# Patient Record
Sex: Female | Born: 1985
Health system: Southern US, Community
[De-identification: ages and names within clinical notes are randomized; demographics above are authoritative.]

## PROBLEM LIST (undated history)

## (undated) DIAGNOSIS — I071 Rheumatic tricuspid insufficiency: Secondary | ICD-10-CM

## (undated) DIAGNOSIS — R7303 Prediabetes: Secondary | ICD-10-CM

## (undated) DIAGNOSIS — F4310 Post-traumatic stress disorder, unspecified: Secondary | ICD-10-CM

## (undated) DIAGNOSIS — F319 Bipolar disorder, unspecified: Secondary | ICD-10-CM

## (undated) DIAGNOSIS — Z973 Presence of spectacles and contact lenses: Secondary | ICD-10-CM

## (undated) DIAGNOSIS — K219 Gastro-esophageal reflux disease without esophagitis: Secondary | ICD-10-CM

## (undated) DIAGNOSIS — K439 Ventral hernia without obstruction or gangrene: Secondary | ICD-10-CM

## (undated) DIAGNOSIS — M7989 Other specified soft tissue disorders: Secondary | ICD-10-CM

## (undated) DIAGNOSIS — F1910 Other psychoactive substance abuse, uncomplicated: Secondary | ICD-10-CM

## (undated) DIAGNOSIS — B1920 Unspecified viral hepatitis C without hepatic coma: Secondary | ICD-10-CM

## (undated) DIAGNOSIS — R0600 Dyspnea, unspecified: Secondary | ICD-10-CM

## (undated) DIAGNOSIS — F419 Anxiety disorder, unspecified: Secondary | ICD-10-CM

## (undated) DIAGNOSIS — K8020 Calculus of gallbladder without cholecystitis without obstruction: Secondary | ICD-10-CM

## (undated) DIAGNOSIS — R12 Heartburn: Secondary | ICD-10-CM

## (undated) DIAGNOSIS — Z9889 Other specified postprocedural states: Secondary | ICD-10-CM

## (undated) DIAGNOSIS — R0602 Shortness of breath: Secondary | ICD-10-CM

## (undated) DIAGNOSIS — I38 Endocarditis, valve unspecified: Secondary | ICD-10-CM

## (undated) DIAGNOSIS — K045 Chronic apical periodontitis: Secondary | ICD-10-CM

## (undated) DIAGNOSIS — T8149XA Infection following a procedure, other surgical site, initial encounter: Secondary | ICD-10-CM

## (undated) DIAGNOSIS — F32A Depression, unspecified: Secondary | ICD-10-CM

## (undated) HISTORY — DX: Depression, unspecified: F32.A

## (undated) HISTORY — DX: Endocarditis, valve unspecified: I38

## (undated) HISTORY — PX: CARDIAC SURGERY: SHX584

## (undated) HISTORY — DX: Other specified soft tissue disorders: M79.89

## (undated) HISTORY — DX: Calculus of gallbladder without cholecystitis without obstruction: K80.20

## (undated) HISTORY — DX: Shortness of breath: R06.02

## (undated) HISTORY — DX: Ventral hernia without obstruction or gangrene: K43.9

## (undated) HISTORY — DX: Rheumatic tricuspid insufficiency: I07.1

## (undated) HISTORY — PX: APPENDECTOMY: SHX54

---

## 2016-11-06 ENCOUNTER — Emergency Department (HOSPITAL_BASED_OUTPATIENT_CLINIC_OR_DEPARTMENT_OTHER)
Admission: EM | Admit: 2016-11-06 | Discharge: 2016-11-06 | Disposition: A | Discharge status: Home / Self Care | Payer: Self-pay | Attending: Emergency Medicine | Admitting: Emergency Medicine

## 2016-11-06 ENCOUNTER — Encounter (HOSPITAL_BASED_OUTPATIENT_CLINIC_OR_DEPARTMENT_OTHER): Payer: Self-pay | Admitting: *Deleted

## 2016-11-06 DIAGNOSIS — X509XXA Other and unspecified overexertion or strenuous movements or postures, initial encounter: Secondary | ICD-10-CM | POA: Insufficient documentation

## 2016-11-06 DIAGNOSIS — Y939 Activity, unspecified: Secondary | ICD-10-CM | POA: Insufficient documentation

## 2016-11-06 DIAGNOSIS — F17210 Nicotine dependence, cigarettes, uncomplicated: Secondary | ICD-10-CM | POA: Insufficient documentation

## 2016-11-06 DIAGNOSIS — S39012A Strain of muscle, fascia and tendon of lower back, initial encounter: Secondary | ICD-10-CM | POA: Insufficient documentation

## 2016-11-06 DIAGNOSIS — Y929 Unspecified place or not applicable: Secondary | ICD-10-CM | POA: Insufficient documentation

## 2016-11-06 DIAGNOSIS — Y999 Unspecified external cause status: Secondary | ICD-10-CM | POA: Insufficient documentation

## 2016-11-06 HISTORY — DX: Unspecified viral hepatitis C without hepatic coma: B19.20

## 2016-11-06 MED ORDER — NAPROXEN 500 MG PO TABS: 500.0000 mg | ORAL_TABLET | Freq: Two times a day (BID) | ORAL | 0 refills | Status: DC

## 2016-11-06 MED ORDER — CYCLOBENZAPRINE HCL 10 MG PO TABS: 5.0000 mg | ORAL_TABLET | Freq: Two times a day (BID) | ORAL | 0 refills | Status: DC | PRN

## 2016-11-06 MED ORDER — KETOROLAC TROMETHAMINE 60 MG/2ML IM SOLN
60.0000 mg | Freq: Once | INTRAMUSCULAR | Status: AC
  Administered 2016-11-06: 60 mg via INTRAMUSCULAR
  Filled 2016-11-06: qty 2

## 2016-11-06 MED ORDER — METHYLPREDNISOLONE 4 MG PO TBPK: ORAL_TABLET | ORAL | 0 refills | Status: DC

## 2016-11-06 MED FILL — METHYLPREDNISOLONE 4 MG TAB: 4 | 6 days supply | Qty: 21 | Fill #0

## 2016-11-06 MED FILL — NAPROXEN 500 MG TABLET: 500 | 7 days supply | Qty: 14 | Fill #0

## 2016-11-06 MED FILL — CYCLOBENZAPRINE 10 MG TAB: 10 | 5 days supply | Qty: 10 | Fill #0

## 2016-11-06 NOTE — ED Provider Notes (Signed)
[facility] Provider Note   CSN: [id number] Arrival date & time: 11/06/16  7829     History   Chief Complaint Chief Complaint  Patient presents with  . Back Pain    HPI Heather Day is a 31 y.o. female.  The history is provided by the patient. No language interpreter was used.  Back Pain     Heather Day is a 31 y.o. female who presents to the Emergency Department complaining of back pain.  She reports 3-4 days of right lower back pain. Pain is constant in nature and sharp and stabbing when she moves. The movements that bother her are bending as well as lifting her right leg. Pain is nonradiating in nature. She denies any fevers, night sweats, numbness, weakness, abdominal pain, change in urination. Does a lot of heavy lifting for work but does not recall any definite injury. 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 That was 2 years ago and she completed treatment. She did have a short relapse and IV drug use 2 months ago but none since then. She denies any skin lesions. She has tried ibuprofen at home with no significant change in her pain. Past Medical History:  Diagnosis Date  . Hepatitis C     There are no active problems to display for this patient.   Past Surgical History:  Procedure Laterality Date  . CESAREAN SECTION      OB History    No data available       Home Medications    Prior to Admission medications   Medication Sig Start Date End Date Taking? Authorizing Provider  cyclobenzaprine (FLEXERIL) 10 MG tablet Take 0.5-1 tablets (5-10 mg total) by mouth 2 (two) times daily as needed for muscle spasms. 11/06/16   Tilden Fossa, MD  methylPREDNISolone (MEDROL DOSEPAK) 4 MG TBPK tablet Take according to label instructions 11/06/16   Tilden Fossa, MD  naproxen (NAPROSYN) 500 MG tablet Take 1 tablet (500 mg total) by mouth 2 (two) times daily. 11/06/16   Tilden Fossa, MD    Family History No family  history on file.  Social History Social History  Substance Use Topics  . Smoking status: Current Every Day Smoker    Packs/day: 1.00    Years: 20.00    Types: Cigarettes  . Smokeless tobacco: Not on file  . Alcohol use No     Allergies   Patient has no known allergies.   Review of Systems Review of Systems  Musculoskeletal: Positive for back pain.  All other systems reviewed and are negative.    Physical Exam Updated Vital Signs BP 114/87 (BP Location: Right Arm)   Pulse 78   Temp 97.7 F (36.5 C) (Oral)   Ht 5\' 8"  (1.727 m)   Wt 104.3 kg (230 lb)   LMP 11/05/2016 (Exact Date)   SpO2 98%   BMI 34.97 kg/m   Physical Exam  Constitutional: She is oriented to person, place, and time. She appears well-developed and well-nourished.  HENT:  Head: Normocephalic and atraumatic.  Cardiovascular: Normal rate and regular rhythm.   No murmur heard. Pulmonary/Chest: Effort normal and breath sounds normal. No respiratory distress.  Abdominal: Soft. There is no tenderness. There is no rebound and no guarding.  Musculoskeletal: She exhibits no edema.  2+ DP pulses bilaterally. There is mild tenderness to palpation over the right lateral lower back. There is referrable pain to that area when she raises her  bilateral legs, right greater than left as well as bending.  Neurological: She is alert and oriented to person, place, and time.  Sensation to light touch intact in bilateral lower extremities with 5 out of 5 strength in bilateral lower extremities.  Skin: Skin is warm and dry.     Psychiatric: She has a normal mood and affect. Her behavior is normal.  Nursing note and vitals reviewed.    ED Treatments / Results  Labs (all labs ordered are listed, but only abnormal results are displayed) Labs Reviewed - No data to display  EKG  EKG Interpretation None       Radiology No results found.  Procedures Procedures (including critical care time)  Medications  Ordered in ED Medications  ketorolac (TORADOL) injection 60 mg (not administered)     Initial Impression / Assessment and Plan / ED Course  I have reviewed the triage vital signs and the nursing notes.  Pertinent labs & imaging results that were available during my care of the patient were reviewed by me and considered in my medical decision making (see chart for details).     Patient with history of IV drug abuse, none currently here for evaluation of right lower back pain. She is neurovascularly intact on examination with no systemic symptoms. Current clinical picture is not consistent with renal colic, pyelonephritis, infection related to IV drug abuse. Discussed with patient home care for lumbar strain. Discussed rest with activity as tolerated, patient of heat and massage. Providing muscle relaxant, naproxen, steroids. Close return precautions as well as outpatient follow-up discussed.  Final Clinical Impressions(s) / ED Diagnoses   Final diagnoses:  Strain of lumbar region, initial encounter    New Prescriptions New Prescriptions   CYCLOBENZAPRINE (FLEXERIL) 10 MG TABLET    Take 0.5-1 tablets (5-10 mg total) by mouth 2 (two) times daily as needed for muscle spasms.   METHYLPREDNISOLONE (MEDROL DOSEPAK) 4 MG TBPK TABLET    Take according to label instructions   NAPROXEN (NAPROSYN) 500 MG TABLET    Take 1 tablet (500 mg total) by mouth 2 (two) times daily.     Tilden Fossa, MD 11/06/16 [phone]

## 2016-11-06 NOTE — ED Notes (Signed)
DC instructions reviewed with pt and husband, also reviewed Rx as written by EDP, Discussed safety while taking PO muscle relaxants. Also importance of making a follow up appt as recommened by EDP. Opportunity for questions provided.

## 2016-11-06 NOTE — ED Notes (Signed)
ED Provider at bedside. 

## 2016-11-06 NOTE — ED Triage Notes (Signed)
Pt states began having lower back pain approx 4 days ago, has sharp stabbing pains intermittently. Does a lot of lifting at work. Denies any injuries, numbness or tingling at lower extremities

## 2017-01-01 ENCOUNTER — Emergency Department (HOSPITAL_BASED_OUTPATIENT_CLINIC_OR_DEPARTMENT_OTHER)
Admission: EM | Admit: 2017-01-01 | Discharge: 2017-01-01 | Disposition: A | Discharge status: Home / Self Care | Payer: Self-pay | Attending: Emergency Medicine | Admitting: Emergency Medicine

## 2017-01-01 ENCOUNTER — Encounter (HOSPITAL_BASED_OUTPATIENT_CLINIC_OR_DEPARTMENT_OTHER): Payer: Self-pay | Admitting: *Deleted

## 2017-01-01 ENCOUNTER — Emergency Department (HOSPITAL_BASED_OUTPATIENT_CLINIC_OR_DEPARTMENT_OTHER): Payer: Self-pay

## 2017-01-01 DIAGNOSIS — Z791 Long term (current) use of non-steroidal anti-inflammatories (NSAID): Secondary | ICD-10-CM | POA: Insufficient documentation

## 2017-01-01 DIAGNOSIS — F17210 Nicotine dependence, cigarettes, uncomplicated: Secondary | ICD-10-CM | POA: Insufficient documentation

## 2017-01-01 DIAGNOSIS — G43109 Migraine with aura, not intractable, without status migrainosus: Secondary | ICD-10-CM | POA: Insufficient documentation

## 2017-01-01 LAB — CBC WITH DIFFERENTIAL/PLATELET
BASOS PCT: 0 %
Basophils Absolute: 0 10*3/uL (ref 0.0–0.1)
EOS PCT: 1 %
Eosinophils Absolute: 0.1 10*3/uL (ref 0.0–0.7)
HEMATOCRIT: 38.4 % (ref 36.0–46.0)
Hemoglobin: 12.8 g/dL (ref 12.0–15.0)
Lymphocytes Relative: 28 %
Lymphs Abs: 2.2 10*3/uL (ref 0.7–4.0)
MCH: 29.6 pg (ref 26.0–34.0)
MCHC: 33.3 g/dL (ref 30.0–36.0)
MCV: 88.7 fL (ref 78.0–100.0)
MONO ABS: 0.4 10*3/uL (ref 0.1–1.0)
MONOS PCT: 6 %
NEUTROS ABS: 4.9 10*3/uL (ref 1.7–7.7)
Neutrophils Relative %: 65 %
PLATELETS: 220 10*3/uL (ref 150–400)
RBC: 4.33 MIL/uL (ref 3.87–5.11)
RDW: 12.8 % (ref 11.5–15.5)
WBC: 7.6 10*3/uL (ref 4.0–10.5)

## 2017-01-01 LAB — URINALYSIS, MICROSCOPIC (REFLEX)

## 2017-01-01 LAB — COMPREHENSIVE METABOLIC PANEL
ALBUMIN: 4 g/dL (ref 3.5–5.0)
ALT: 15 U/L (ref 14–54)
ANION GAP: 8 (ref 5–15)
AST: 22 U/L (ref 15–41)
Alkaline Phosphatase: 48 U/L (ref 38–126)
BILIRUBIN TOTAL: 0.4 mg/dL (ref 0.3–1.2)
BUN: 18 mg/dL (ref 6–20)
CHLORIDE: 103 mmol/L (ref 101–111)
CO2: 24 mmol/L (ref 22–32)
Calcium: 8.9 mg/dL (ref 8.9–10.3)
Creatinine, Ser: 0.72 mg/dL (ref 0.44–1.00)
GFR calc Af Amer: >60 mL/min (ref >60)
Glucose, Bld: 120 mg/dL — ABNORMAL HIGH (ref 65–99)
POTASSIUM: 3.8 mmol/L (ref 3.5–5.1)
Sodium: 135 mmol/L (ref 135–145)
TOTAL PROTEIN: 6.9 g/dL (ref 6.5–8.1)

## 2017-01-01 LAB — URINALYSIS, ROUTINE W REFLEX MICROSCOPIC
Bilirubin Urine: NEGATIVE
GLUCOSE, UA: NEGATIVE mg/dL
HGB URINE DIPSTICK: NEGATIVE
KETONES UR: NEGATIVE mg/dL
Nitrite: NEGATIVE
PH: 7 (ref 5.0–8.0)
PROTEIN: NEGATIVE mg/dL
Specific Gravity, Urine: 1.02 (ref 1.005–1.030)

## 2017-01-01 LAB — PREGNANCY, URINE: PREG TEST UR: NEGATIVE

## 2017-01-01 MED ORDER — PROCHLORPERAZINE EDISYLATE 5 MG/ML IJ SOLN
10.0000 mg | Freq: Once | INTRAMUSCULAR | Status: AC
  Administered 2017-01-01: 10 mg via INTRAVENOUS
  Filled 2017-01-01: qty 2

## 2017-01-01 MED ORDER — DIPHENHYDRAMINE HCL 50 MG/ML IJ SOLN
25.0000 mg | Freq: Once | INTRAMUSCULAR | Status: AC
  Administered 2017-01-01: 25 mg via INTRAVENOUS
  Filled 2017-01-01: qty 1

## 2017-01-01 NOTE — Discharge Instructions (Signed)
Consider taking an over-the-counter medication such as Excedrin Migraine if you have any recurrent symptoms, follow-up with a primary care doctor , return as needed for worsening symptoms

## 2017-01-01 NOTE — ED Provider Notes (Signed)
[facility] EMERGENCY DEPARTMENT Provider Note   CSN: [id number] Arrival date & time: 01/01/17  1712     History   Chief Complaint Chief Complaint  Patient presents with  . Headache    HPI Heather Day is a 31 y.o. female.  HPI Emergency room for evaluation of a headache.  The patient states she was out shopping when she started having abnormal visual sensation.  She was noticing spots in front of her eyes.  She started to develop a mild headache.  She tried taking some ibuprofen and went to sleep however the headache persisted and was twice as bad as it was before.  Patient was concerned so she decided to come to the emergency room.  She denies any history of migraine headaches.  No fevers or chills.  No vomiting or diarrhea.  No focal numbness or weakness. Past Medical History:  Diagnosis Date  . Hepatitis C     There are no active problems to display for this patient.   Past Surgical History:  Procedure Laterality Date  . CESAREAN SECTION      OB History    No data available       Home Medications    Prior to Admission medications   Medication Sig Start Date End Date Taking? Authorizing Provider  cyclobenzaprine (FLEXERIL) 10 MG tablet Take 0.5-1 tablets (5-10 mg total) by mouth 2 (two) times daily as needed for muscle spasms. 11/06/16   Tilden Fossa, MD  methylPREDNISolone (MEDROL DOSEPAK) 4 MG TBPK tablet Take according to label instructions 11/06/16   Tilden Fossa, MD  naproxen (NAPROSYN) 500 MG tablet Take 1 tablet (500 mg total) by mouth 2 (two) times daily. 11/06/16   Tilden Fossa, MD    Family History No family history on file.  Social History Social History  Substance Use Topics  . Smoking status: Current Every Day Smoker    Packs/day: 1.00    Years: 20.00    Types: Cigarettes  . Smokeless tobacco: Never Used  . Alcohol use No     Allergies   Patient has no known allergies.   Review of Systems Review of Systems  All  other systems reviewed and are negative.    Physical Exam Updated Vital Signs BP 106/61 (BP Location: Right Arm)   Pulse 73   Temp 98.4 F (36.9 C) (Oral)   Resp 18   Ht 1.727 m (5\' 8" )   Wt 104.3 kg (230 lb)   LMP 12/18/2016   SpO2 100%   BMI 34.97 kg/m   Physical Exam  Constitutional: She appears well-developed and well-nourished. No distress.  HENT:  Head: Normocephalic and atraumatic.  Right Ear: External ear normal.  Left Ear: External ear normal.  Eyes: Conjunctivae are normal. Right eye exhibits no discharge. Left eye exhibits no discharge. No scleral icterus.  Neck: Neck supple. No tracheal deviation present.  Cardiovascular: Normal rate, regular rhythm and intact distal pulses.   Pulmonary/Chest: Effort normal and breath sounds normal. No stridor. No respiratory distress. She has no wheezes. She has no rales.  Abdominal: Soft. Bowel sounds are normal. She exhibits no distension. There is no tenderness. There is no rebound and no guarding.  Musculoskeletal: She exhibits no edema or tenderness.  Neurological: She is alert. She has normal strength. No cranial nerve deficit (no facial droop, extraocular movements intact, no slurred speech) or sensory deficit. She exhibits normal muscle tone. She displays no seizure activity. Coordination normal.  Skin: Skin is warm and dry.  No rash noted.  Psychiatric: She has a normal mood and affect.  Nursing note and vitals reviewed.    ED Treatments / Results  Labs (all labs ordered are listed, but only abnormal results are displayed) Labs Reviewed  URINALYSIS, ROUTINE W REFLEX MICROSCOPIC - Abnormal; Notable for the following:       Result Value   Leukocytes, UA TRACE (*)    All other components within normal limits  COMPREHENSIVE METABOLIC PANEL - Abnormal; Notable for the following:    Glucose, Bld 120 (*)    All other components within normal limits  URINALYSIS, MICROSCOPIC (REFLEX) - Abnormal; Notable for the following:      Bacteria, UA RARE (*)    Squamous Epithelial / LPF 6-30 (*)    All other components within normal limits  PREGNANCY, URINE  CBC WITH DIFFERENTIAL/PLATELET    EKG  EKG Interpretation None       Radiology Ct Head Wo Contrast  Result Date: 01/01/2017 CLINICAL DATA:  Worst headache of life, right sided head pain, no previous migraine history. EXAM: CT HEAD WITHOUT CONTRAST TECHNIQUE: Contiguous axial images were obtained from the base of the skull through the vertex without intravenous contrast. COMPARISON:  None. FINDINGS: Brain: Ventricles are normal in size and configuration. All areas of the brain demonstrate normal gray-white matter delineation. There is no mass, hemorrhage, edema or other evidence of acute parenchymal abnormality. No extra-axial hemorrhage. Vascular: No hyperdense vessel or unexpected calcification. Skull: Normal. Negative for fracture or focal lesion. Sinuses/Orbits: No acute finding. Other: None. IMPRESSION: Normal head CT. Electronically Signed   By: Bary RichardStan  Maynard M.D.   On: 01/01/2017 20:10    Procedures Procedures (including critical care time)  Medications Ordered in ED Medications  prochlorperazine (COMPAZINE) injection 10 mg (10 mg Intravenous Given 01/01/17 2007)  diphenhydrAMINE (BENADRYL) injection 25 mg (25 mg Intravenous Given 01/01/17 2008)     Initial Impression / Assessment and Plan / ED Course  I have reviewed the triage vital signs and the nursing notes.  Pertinent labs & imaging results that were available during my care of the patient were reviewed by me and considered in my medical decision making (see chart for details).   Patient presented to the emergency room with complaints of a headache preceded by visual aura.  Patient denied any prior history of headaches and she stated this was the worst when she never had.  CT scan was performed for this reason.  CT scan was within 6 hours of the onset.  No signs of any bleeding.  I doubt a  subarachnoid hemorrhage.  I suspect her symptoms are related to a migraine headache.  She improved with treatment in the emergency room.  Patient appears stable for discharge.  Final Clinical Impressions(s) / ED Diagnoses   Final diagnoses:  Migraine with aura and without status migrainosus, not intractable    New Prescriptions New Prescriptions   No medications on file     Linwood DibblesKnapp, Zenobia Kuennen, MD 01/01/17 2053

## 2017-01-01 NOTE — ED Notes (Signed)
Pt states she is 119 days clean from IV drug use

## 2017-01-01 NOTE — ED Triage Notes (Signed)
Headache x 3 hours. She was seeing spots prior to pain.

## 2017-02-09 ENCOUNTER — Encounter (HOSPITAL_BASED_OUTPATIENT_CLINIC_OR_DEPARTMENT_OTHER): Payer: Self-pay

## 2017-02-09 ENCOUNTER — Other Ambulatory Visit: Payer: Self-pay

## 2017-02-09 ENCOUNTER — Emergency Department (HOSPITAL_BASED_OUTPATIENT_CLINIC_OR_DEPARTMENT_OTHER)
Admission: EM | Admit: 2017-02-09 | Discharge: 2017-02-09 | Disposition: A | Discharge status: Home / Self Care | Payer: Self-pay | Attending: Emergency Medicine | Admitting: Emergency Medicine

## 2017-02-09 ENCOUNTER — Emergency Department (HOSPITAL_BASED_OUTPATIENT_CLINIC_OR_DEPARTMENT_OTHER): Payer: Self-pay

## 2017-02-09 DIAGNOSIS — R0602 Shortness of breath: Secondary | ICD-10-CM | POA: Insufficient documentation

## 2017-02-09 DIAGNOSIS — Z79899 Other long term (current) drug therapy: Secondary | ICD-10-CM | POA: Insufficient documentation

## 2017-02-09 DIAGNOSIS — R0789 Other chest pain: Secondary | ICD-10-CM | POA: Insufficient documentation

## 2017-02-09 DIAGNOSIS — F17210 Nicotine dependence, cigarettes, uncomplicated: Secondary | ICD-10-CM | POA: Insufficient documentation

## 2017-02-09 LAB — URINALYSIS, ROUTINE W REFLEX MICROSCOPIC
BILIRUBIN URINE: NEGATIVE
GLUCOSE, UA: NEGATIVE mg/dL
HGB URINE DIPSTICK: NEGATIVE
KETONES UR: NEGATIVE mg/dL
LEUKOCYTES UA: NEGATIVE
Nitrite: NEGATIVE
PH: 6.5 (ref 5.0–8.0)
PROTEIN: NEGATIVE mg/dL
Specific Gravity, Urine: 1.025 (ref 1.005–1.030)

## 2017-02-09 LAB — CBC
HEMATOCRIT: 39.2 % (ref 36.0–46.0)
Hemoglobin: 13.1 g/dL (ref 12.0–15.0)
MCH: 29.9 pg (ref 26.0–34.0)
MCHC: 33.4 g/dL (ref 30.0–36.0)
MCV: 89.5 fL (ref 78.0–100.0)
PLATELETS: 249 10*3/uL (ref 150–400)
RBC: 4.38 MIL/uL (ref 3.87–5.11)
RDW: 12.5 % (ref 11.5–15.5)
WBC: 9.1 10*3/uL (ref 4.0–10.5)

## 2017-02-09 LAB — BASIC METABOLIC PANEL
Anion gap: 8 (ref 5–15)
BUN: 23 mg/dL — AB (ref 6–20)
CHLORIDE: 105 mmol/L (ref 101–111)
CO2: 25 mmol/L (ref 22–32)
CREATININE: 0.71 mg/dL (ref 0.44–1.00)
Calcium: 9.5 mg/dL (ref 8.9–10.3)
GFR calc Af Amer: >60 mL/min (ref >60)
GFR calc non Af Amer: >60 mL/min (ref >60)
GLUCOSE: 84 mg/dL (ref 65–99)
POTASSIUM: 3.6 mmol/L (ref 3.5–5.1)
SODIUM: 138 mmol/L (ref 135–145)

## 2017-02-09 LAB — HEPATIC FUNCTION PANEL
ALK PHOS: 51 U/L (ref 38–126)
ALT: 13 U/L — AB (ref 14–54)
AST: 19 U/L (ref 15–41)
Albumin: 4 g/dL (ref 3.5–5.0)
BILIRUBIN DIRECT: 0.1 mg/dL (ref 0.1–0.5)
BILIRUBIN INDIRECT: 0.3 mg/dL (ref 0.3–0.9)
BILIRUBIN TOTAL: 0.4 mg/dL (ref 0.3–1.2)
Total Protein: 7.1 g/dL (ref 6.5–8.1)

## 2017-02-09 LAB — D-DIMER, QUANTITATIVE (NOT AT ARMC): D DIMER QUANT: 0.46 ug{FEU}/mL (ref 0.00–0.50)

## 2017-02-09 LAB — PREGNANCY, URINE: Preg Test, Ur: NEGATIVE

## 2017-02-09 LAB — RAPID URINE DRUG SCREEN, HOSP PERFORMED
Amphetamines: NOT DETECTED
BARBITURATES: NOT DETECTED
BENZODIAZEPINES: NOT DETECTED
Cocaine: NOT DETECTED
Opiates: NOT DETECTED
Tetrahydrocannabinol: NOT DETECTED

## 2017-02-09 LAB — TROPONIN I: Troponin I: <0.03 ng/mL (ref <0.03)

## 2017-02-09 MED ORDER — KETOROLAC TROMETHAMINE 30 MG/ML IJ SOLN
30.0000 mg | Freq: Once | INTRAMUSCULAR | Status: AC
  Administered 2017-02-09: 30 mg via INTRAVENOUS
  Filled 2017-02-09: qty 1

## 2017-02-09 MED ORDER — GI COCKTAIL ~~LOC~~
30.0000 mL | Freq: Once | ORAL | Status: AC
  Administered 2017-02-09: 30 mL via ORAL
  Filled 2017-02-09: qty 30

## 2017-02-09 MED ORDER — PANTOPRAZOLE SODIUM 40 MG IV SOLR
40.0000 mg | Freq: Once | INTRAVENOUS | Status: AC
  Administered 2017-02-09: 40 mg via INTRAVENOUS
  Filled 2017-02-09: qty 40

## 2017-02-09 NOTE — ED Notes (Signed)
C/o chest pain radiating to left back w weakness, sob x 4 weeks  Getting worse

## 2017-02-09 NOTE — ED Triage Notes (Signed)
Pt c/o CP x2 weeks with progressive worsening. Associated ShOB, nausea, diaphoresis.

## 2017-02-09 NOTE — Discharge Instructions (Addendum)
You may alternate Tylenol 1000 mg every 6 hours as needed for pain and Ibuprofen 800 mg every 8 hours as needed for pain.  Please take Ibuprofen with food.  Your labs including blood counts, electrolytes, kidney function, liver tests, cardiac labs and a test called a d-dimer which rules out blood clots were normal today.  Your urine showed no sign of infection or dehydration.  Your pregnancy test was negative.  Your EKG is normal.  Your chest x-ray was clear.  I recommend close follow-up with a primary care physician and outpatient cardiologist.  To find a primary care or specialty doctor please call [phone] or [phone] to access "[place] Find a Doctor Service."  You may also go on the [facility] website at [email or web address]  There are also multiple [facility] practices throughout the [place] that are frequently accepting new patients. You may find a clinic that is close to your home and contact them.  [facility] -  [address] [place] [phone]   [facility] -  [address] [place] [id number] [phone]   [facility] [phone]  [place] [phone]

## 2017-02-09 NOTE — ED Provider Notes (Signed)
TIME SEEN: 1:35 AM  CHIEF COMPLAINT: Chest pain and shortness of breath  HPI: Patient is a 31 year old female with previous history of IV drug abuse, hepatitis C, endocarditis who presents to the emergency department with complaints of chest pain and shortness of breath.  States she has chest pain from her anterior chest into her back that feels like a "vice".  She feels short of breath, lightheaded, weak and fatigued over the past several weeks but progressively worsening.  States tonight she went to bed around 9:30 PM because she felt so tired.  She denies any fevers.  Reports she has had sweats but no chills.  Has had nonproductive cough.  No vomiting but has had diarrhea.  Denies dysuria, hematuria, vaginal bleeding or discharge.  Last menstrual period was 2 weeks ago.  States she has not used any IV drugs in the past 6 months.  She recently moved here 6 months ago from [place].  She does not have a primary care physician or cardiologist.  States that she has not followed up as an outpatient because she does not have insurance.  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  She left AGAINST MEDICAL ADVICE before this surgery could be performed.  No history of CAD.  No history of PE or DVT.  States she tried Tums prior to arrival without any relief.  ROS: See HPI Constitutional: no fever  Eyes: no drainage  ENT: no runny nose   Cardiovascular:  no chest pain  Resp: no SOB  GI: no vomiting GU: no dysuria Integumentary: no rash  Allergy: no hives  Musculoskeletal: no leg swelling  Neurological: no slurred speech ROS otherwise negative  PAST MEDICAL HISTORY/PAST SURGICAL HISTORY:  Past Medical History:  Diagnosis Date  . Hepatitis C     MEDICATIONS:  Prior to Admission medications   Medication Sig Start Date End Date Taking? Authorizing Provider  cyclobenzaprine  (FLEXERIL) 10 MG tablet Take 0.5-1 tablets (5-10 mg total) by mouth 2 (two) times daily as needed for muscle spasms. 11/06/16   Tilden Fossa, MD  methylPREDNISolone (MEDROL DOSEPAK) 4 MG TBPK tablet Take according to label instructions 11/06/16   Tilden Fossa, MD  naproxen (NAPROSYN) 500 MG tablet Take 1 tablet (500 mg total) by mouth 2 (two) times daily. 11/06/16   Tilden Fossa, MD    ALLERGIES:  No Known Allergies  SOCIAL HISTORY:  Social History   Tobacco Use  . Smoking status: Current Every Day Smoker    Packs/day: 1.00    Years: 20.00    Pack years: 20.00    Types: Cigarettes  . Smokeless tobacco: Never Used  Substance Use Topics  . Alcohol use: No    FAMILY HISTORY: No family history on file.  EXAM: BP 121/87 (BP Location: Right Arm)   Pulse 79   Temp 97.6 F (36.4 C) (Oral)   Resp 20   Ht 5\' 8"  (1.727 m)   Wt 108.9 kg (240 lb)   SpO2 98%   BMI 36.49 kg/m  CONSTITUTIONAL: Alert and oriented and responds appropriately to questions. Well-appearing; well-nourished, obese HEAD: Normocephalic EYES: Conjunctivae clear, pupils appear equal, EOMI ENT: normal nose; moist mucous membranes NECK: Supple, no meningismus, no nuchal rigidity, no LAD  CARD: RRR; S1 and S2 appreciated; no murmurs, no clicks, no rubs, no gallops CHEST:  Chest wall is nontender to  palpation.  No crepitus, ecchymosis, erythema, warmth, rash or other lesions present.   RESP: Normal chest excursion without splinting or tachypnea; breath sounds clear and equal bilaterally; no wheezes, no rhonchi, no rales, no hypoxia or respiratory distress, speaking full sentences ABD/GI: Normal bowel sounds; non-distended; soft, non-tender, no rebound, no guarding, no peritoneal signs, no hepatosplenomegaly BACK:  The back appears normal and is non-tender to palpation, there is no CVA tenderness, no midline spinal tenderness or step-off or deformity, no redness or warmth or other lesions noted, no ecchymosis or  swelling EXT: Normal ROM in all joints; non-tender to palpation; no edema; normal capillary refill; no cyanosis, no calf tenderness or swelling    SKIN: Normal color for age and race; warm; no rash NEURO: Moves all extremities equally, normal speech, cranial nerves II through XII intact, sensation to light touch intact diffusely PSYCH: The patient's mood and manner are appropriate. Grooming and personal hygiene are appropriate.  MEDICAL DECISION MAKING: Patient here with chest pain.  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  Denies recent IV drug use.  She is afebrile here.  Low suspicion for ACS given no risk factors other than obesity and tobacco use.  Will obtain troponin.  Given symptoms ongoing for several weeks with normal EKG I feel one troponin would be sufficient.  We will also obtain d-dimer to rule out PE.  Will check labs, blood cultures, urine and chest x-ray today.  Anticipate if her workup is negative she will need to follow-up with a cardiologist as an outpatient.  Doubt dissection.  ED PROGRESS: Patient's workup has been unremarkable.  No leukocytosis.  Normal hemoglobin.  Normal electrolytes and renal function.  Negative troponin.  Negative d-dimer.  Chest x-ray is clear without edema, pneumothorax or infiltrate.  Urine shows no sign of infection or dehydration.  Pregnancy test is negative.  UDS is negative.  Blood cultures are pending and I have discussed with her if these are positive she will be contacted.  Low suspicion for bacteremia at this time given her normal vital signs and other normal labs.  She reports no improvement with Toradol.  We have discussed at length that I do not feel that giving her narcotics is appropriate given her h/o IVDA and she completely agrees.  We will try GI cocktail and Protonix.  Patient comfortable with this plan.  Anticipate discharge with outpatient cardiology and PCP follow-up.  No significant relief after GI cocktail and  Protonix.  Patient feels comfortable with plan for discharge without any further medication in the emergency department.  We have discussed that we have ruled out a lot of life-threatening pathology today and I have recommended close follow-up with PCP and cardiologist as an outpatient.  We have discussed at length return precautions.  Patient is comfortable with this plan.  I have low suspicion that this is endocarditis today but do recommend an outpatient echocardiogram.   At this time, I do not feel there is any life-threatening condition present. I have reviewed and discussed all results (EKG, imaging, lab, urine as appropriate) and exam findings with patient/family. I have reviewed nursing notes and appropriate previous records.  I feel the patient is safe to be discharged home without further emergent workup and can continue workup as an outpatient as needed. Discussed usual and customary return precautions. Patient/family verbalize understanding and are comfortable with this plan.  Outpatient follow-up has been provided if needed. All questions have been answered.   EKG Interpretation  Date/Time:  Friday February 09 2017 01:26:40 EST Ventricular Rate:  75 PR Interval:    QRS Duration: 92 QT Interval:  381 QTC Calculation: 426 R Axis:   54 Text Interpretation:  Sinus rhythm Borderline T wave abnormalities No old tracing to compare Confirmed by Cherry Wittwer, Baxter HireKristen [id number]) on 02/09/2017 1:35:37 AM          Braydn Carneiro, Layla MawKristen N, DO 02/09/17 [id number]

## 2017-02-14 LAB — CULTURE, BLOOD (ROUTINE X 2)
CULTURE: NO GROWTH
Culture: NO GROWTH
SPECIAL REQUESTS: ADEQUATE
Special Requests: ADEQUATE

## 2017-02-26 ENCOUNTER — Ambulatory Visit (INDEPENDENT_AMBULATORY_CARE_PROVIDER_SITE_OTHER): Payer: Self-pay | Admitting: Cardiology

## 2017-02-26 ENCOUNTER — Encounter: Payer: Self-pay | Admitting: Cardiology

## 2017-02-26 DIAGNOSIS — Z87898 Personal history of other specified conditions: Secondary | ICD-10-CM

## 2017-02-26 DIAGNOSIS — I071 Rheumatic tricuspid insufficiency: Secondary | ICD-10-CM | POA: Insufficient documentation

## 2017-02-26 DIAGNOSIS — F1911 Other psychoactive substance abuse, in remission: Secondary | ICD-10-CM | POA: Insufficient documentation

## 2017-02-26 DIAGNOSIS — F17200 Nicotine dependence, unspecified, uncomplicated: Secondary | ICD-10-CM | POA: Insufficient documentation

## 2017-02-26 DIAGNOSIS — I361 Nonrheumatic tricuspid (valve) insufficiency: Secondary | ICD-10-CM

## 2017-02-26 DIAGNOSIS — R0789 Other chest pain: Secondary | ICD-10-CM

## 2017-02-26 DIAGNOSIS — Z8679 Personal history of other diseases of the circulatory system: Secondary | ICD-10-CM | POA: Insufficient documentation

## 2017-02-26 DIAGNOSIS — IMO0001 Reserved for inherently not codable concepts without codable children: Secondary | ICD-10-CM | POA: Insufficient documentation

## 2017-02-26 NOTE — Progress Notes (Signed)
Cardiology Consultation:    Date:  02/26/2017   ID:  Heather LotAshly Strawder, DOB 03/22/1985, MRN [id number]  PCP:  Patient, No Pcp Per  Cardiologist:  Gypsy Balsamobert Krasowski, MD   Referring MD: No ref. provider found   Chief Complaint  Patient presents with  . Endocarditis  . Chest Pain    constant  I have some chest pain  History of Present Illness:    Heather Day is a 31 y.o. female who is being seen today for the evaluation of chest pain at the request of No ref. provider found.  Karma Gre[place] that happened 6 months ago.  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  She will she ended up having endocarditis.  She was admitted to the hospital she spent over the 8 weeks she said she got 2 different organs in her blood eventually she was told she needed open heart surgery and tricuspid valve replacement however she refused to have it when she left hospital to continue using drugs.  Eventually 6 months ago she decided to get clean.  Since that time she did not use any drugs.  Recently she ended up going to hospital emergency room a few times because of atypical symptoms.  She complained of having some chest pain pain is continues all the time she grades this 5 pain scale up to 10 taking deep breath coughing does not make any difference walking or exercise is not not make any difference.  She has this pain for weeks to months.  Describe also to have some fatigue and tiredness.  Denies having any swelling of lower extremities.  Does not have proximal nocturnal dyspnea described to have some skipped beats that she calls as palpitations also some forceful beating in her heart.  She does get dizziness when she is trying to get up quickly.  She does have low blood pressure all her life but now appears to be lower.  There is no passing out.  No exertional chest pain tightness squeezing pressure burning chest  Past Medical  History:  Diagnosis Date  . Hepatitis C     Past Surgical History:  Procedure Laterality Date  . CESAREAN SECTION      Current Medications: No outpatient medications have been marked as taking for the 02/26/17 encounter (Office Visit) with Georgeanna LeaKrasowski, Robert J, MD.     Allergies:   Patient has no known allergies.   Social History   Socioeconomic History  . Marital status: Single    Spouse name: None  . Number of children: None  . Years of education: None  . Highest education level: None  Social Needs  . Financial resource strain: None  . Food insecurity - worry: None  . Food insecurity - inability: None  . Transportation needs - medical: None  . Transportation needs - non-medical: None  Occupational History  . None  Tobacco Use  . Smoking status: Current Every Day Smoker    Packs/day: 1.00    Years: 20.00    Pack years: 20.00    Types: Cigarettes  . Smokeless tobacco: Never Used  Substance and Sexual Activity  . Alcohol use: No  . Drug use: No  . Sexual activity: Yes  Other Topics Concern  . None  Social History Narrative  . None     Family History: The patient's family history is not on file. ROS:   Please  see the history of present illness.    All 14 point review of systems negative except as described per history of present illness.  EKGs/Labs/Other Studies Reviewed:    The following studies were reviewed today: Laboratory tests from emergency room reviewed including EKG    Recent Labs: 02/09/2017: ALT 13; BUN 23; Creatinine, Ser 0.71; Hemoglobin 13.1; Platelets 249; Potassium 3.6; Sodium 138  Recent Lipid Panel No results found for: CHOL, TRIG, HDL, CHOLHDL, VLDL, LDLCALC, LDLDIRECT  Physical Exam:    VS:  BP 100/80 (BP Location: Left Arm, Patient Position: Sitting, Cuff Size: Large)   Pulse 100   Ht 5\' 8"  (1.727 m)   Wt 276 lb (125.2 kg)   SpO2 99%   BMI 41.97 kg/m     Wt Readings from Last 3 Encounters:  02/26/17 276 lb (125.2 kg)    02/09/17 240 lb (108.9 kg)  01/01/17 230 lb (104.3 kg)     GEN:  Well nourished, well developed in no acute distress HEENT: Normal NECK: No JVD; No carotid bruits LYMPHATICS: No lymphadenopathy CARDIAC: RRR, systolic murmur best heard at the left border of the sternum grade 1/6 to 2/6.  Radiation, no rubs, no gallops RESPIRATORY:  Clear to auscultation without rales, wheezing or rhonchi  ABDOMEN: Soft, non-tender, non-distended MUSCULOSKELETAL:  No edema; No deformity  SKIN: Warm and dry NEUROLOGIC:  Alert and oriented x 3 PSYCHIATRIC:  Normal affect   ASSESSMENT:    1. Atypical chest pain   2. History of endocarditis   3. Non-rheumatic tricuspid valve insufficiency   4. History of drug abuse   5. Smoking    PLAN:    In order of problems listed above:  1. Atypical chest pain: I doubt very much that this is cardiac in origin as a continuous sensation there is no aggravating or relieving factor.  I do not think we need to proceed with CAD workup at the moment. 2. Endocarditis: Does not have any signs and symptoms of active infection with now no stamina of active folliculitis.  Recent blood tests were negative for bacteria.  I will ask you to have echocardiogram to reassess her tricuspid valve.  Overall likely I do not see any evidence of decompensated congestive heart failure.  She does not have any hepatomegaly, does not have swelling of lower extremities I do not see any JVD enlargement.  She tells me that she was told she needs a tricuspid valve replacement with may be for different reasons rather than critical tricuspid regurgitation but again the proper assessment need to be done by doing echocardiogram.  She may require transesophageal echocardiogram in the future. 3. Tricuspid valve regurgitation: I do not see any clinical signs and symptoms of decompensated congestive heart failure.  We will continue monitoring. 4. Drug abuse: States clean for last 6 months.  Her recent visit  in the emergency room prompt drug screening which was negative. 5. Smoking she smokes about 3/4 pack/day I told her that ideal situation will be for her to quit she understands she will try to work on that.   Medication Adjustments/Labs and Tests Ordered: Current medicines are reviewed at length with the patient today.  Concerns regarding medicines are outlined above.  No orders of the defined types were placed in this encounter.  No orders of the defined types were placed in this encounter.   Signed, Georgeanna Leaobert J. Krasowski, MD, [facility]. 02/26/2017 1:55 PM    [facility]

## 2017-02-26 NOTE — Patient Instructions (Addendum)
Medication Instructions:  Your physician recommends that you continue on your current medications as directed. Please refer to the Current Medication list given to you today.  Please be aware that you will need to have an anti-biotic prior to any dental work. Please let your dentist know that you will need this due to your Tricuspid Valve  Labwork: Your physician recommends that you have lab work today: Lipid panel to check your cholesterol  Testing/Procedures: Your physician has requested that you have an echocardiogram. Echocardiography is a painless test that uses sound waves to create images of your heart. It provides your doctor with information about the size and shape of your heart and how well your heart's chambers and valves are working. This procedure takes approximately one hour. There are no restrictions for this procedure.   Follow-Up: Your physician recommends that you schedule a follow-up appointment in: 1 month follow up with Dr. Bing MatterKrasowski   Any Other Special Instructions Will Be Listed Below (If Applicable).  Please note that any paperwork needing to be filled out by the provider will need to be addressed at the front desk prior to seeing the provider. Please note that any paperwork FMLA, Disability or other documents regarding health condition is subject to a $25.00 charge that must be received prior to completion of paperwork in the form of a money order or check.    If you need a refill on your cardiac medications before your next appointment, please call your pharmacy.

## 2017-02-27 LAB — LIPID PANEL
CHOLESTEROL TOTAL: 214 mg/dL — AB (ref 100–199)
Chol/HDL Ratio: 4.8 ratio — ABNORMAL HIGH (ref 0.0–4.4)
HDL: 45 mg/dL (ref >39)
LDL CALC: 132 mg/dL — AB (ref 0–99)
TRIGLYCERIDES: 184 mg/dL — AB (ref 0–149)
VLDL CHOLESTEROL CAL: 37 mg/dL (ref 5–40)

## 2017-03-01 ENCOUNTER — Telehealth: Payer: Self-pay

## 2017-03-01 NOTE — Telephone Encounter (Signed)
Patient called stating she has having severe chest pain, cannot lift her arm. Patient advised to go to the emergency room. Patient verbalized understanding. No further questions.

## 2017-03-09 ENCOUNTER — Ambulatory Visit (HOSPITAL_BASED_OUTPATIENT_CLINIC_OR_DEPARTMENT_OTHER)
Admission: RE | Admit: 2017-03-09 | Discharge: 2017-03-09 | Disposition: A | Discharge status: Home / Self Care | Payer: Self-pay | Source: Ambulatory Visit | Attending: Cardiology | Admitting: Cardiology

## 2017-03-09 DIAGNOSIS — Z8679 Personal history of other diseases of the circulatory system: Secondary | ICD-10-CM | POA: Insufficient documentation

## 2017-03-09 DIAGNOSIS — F1911 Other psychoactive substance abuse, in remission: Secondary | ICD-10-CM

## 2017-03-09 DIAGNOSIS — I361 Nonrheumatic tricuspid (valve) insufficiency: Secondary | ICD-10-CM | POA: Insufficient documentation

## 2017-03-09 DIAGNOSIS — F17200 Nicotine dependence, unspecified, uncomplicated: Secondary | ICD-10-CM | POA: Insufficient documentation

## 2017-03-09 DIAGNOSIS — R011 Cardiac murmur, unspecified: Secondary | ICD-10-CM | POA: Insufficient documentation

## 2017-03-09 DIAGNOSIS — R0789 Other chest pain: Secondary | ICD-10-CM | POA: Insufficient documentation

## 2017-03-09 DIAGNOSIS — I517 Cardiomegaly: Secondary | ICD-10-CM | POA: Insufficient documentation

## 2017-03-09 DIAGNOSIS — Z87898 Personal history of other specified conditions: Secondary | ICD-10-CM | POA: Insufficient documentation

## 2017-03-09 LAB — ECHOCARDIOGRAM COMPLETE
AOVTI: 25.9 cm
AV Area VTI index: 1.38 cm2/m2
AV Area VTI: 3.34 cm2
AV Mean grad: 3 mmHg
AV Peak grad: 6 mmHg
AV VEL mean LVOT/AV: 0.77
AV area mean vel ind: 1.48 cm2/m2
AV peak Index: 1.43
AVAREAMEANV: 3.47 cm2
AVPKVEL: 118 cm/s
Ao pk vel: 0.74 m/s
CHL CUP AV VEL: 3.23
CHL CUP RV SYS PRESS: 28 mmHg
DOP CAL AO MEAN VELOCITY: 86.2 cm/s
E/e' ratio: 3.25
EWDT: 187 ms
FS: 27 % — AB (ref 28–44)
IVS/LV PW RATIO, ED: 1.15
LA diam end sys: 41 mm
LA vol index: 21.9 mL/m2
LADIAMINDEX: 1.75 cm/m2
LASIZE: 41 mm
LAVOL: 51.2 mL
LAVOLA4C: 38.5 mL
LV E/e' medial: 3.25
LV SIMPSON'S DISK: 48
LV TDI E'LATERAL: 17
LV TDI E'MEDIAL: 10.2
LVDIAVOL: 132 mL — AB (ref 46–106)
LVDIAVOLIN: 56 mL/m2
LVEEAVG: 3.25
LVELAT: 17 cm/s
LVOT VTI: 18.5 cm
LVOT area: 4.52 cm2
LVOT diameter: 24 mm
LVOT peak vel: 87.2 cm/s
LVOTSV: 84 mL
LVOTVTI: 0.71 cm
LVSYSVOL: 69 mL — AB (ref 14–42)
LVSYSVOLIN: 29 mL/m2
MV Dec: 187
MV pk E vel: 55.3 m/s
MVPKAVEL: 64.5 m/s
PW: 8.92 mm — AB (ref 0.6–1.1)
RV LATERAL S' VELOCITY: 13.4 cm/s
RV TAPSE: 31.3 mm
Reg peak vel: 252 cm/s
Stroke v: 63 ml
TR max vel: 252 cm/s
Valve area index: 1.38
Valve area: 3.23 cm2

## 2017-03-09 NOTE — Progress Notes (Signed)
Echocardiogram 2D Echocardiogram has been performed.  Dorothey BasemanReel, Crissa Sowder M 03/09/2017, 2:07 PM

## 2017-03-12 ENCOUNTER — Telehealth: Payer: Self-pay | Admitting: Cardiology

## 2017-03-12 NOTE — Telephone Encounter (Signed)
Patient looking for Echo results

## 2017-03-12 NOTE — Telephone Encounter (Signed)
Patient advised of echo results. Patient states that she has been having what feels like heart burn, patient has not had heart burn ever except for when she was pregnant 11 years ago. Patient states that now she is having some fluttering feeling over her should. Advised patient to go to the nearest emergency room for evaluation. Patient verbalized understanding. No further questions.

## 2017-03-14 ENCOUNTER — Other Ambulatory Visit: Payer: Self-pay

## 2017-03-14 ENCOUNTER — Emergency Department (HOSPITAL_BASED_OUTPATIENT_CLINIC_OR_DEPARTMENT_OTHER): Payer: Self-pay

## 2017-03-14 ENCOUNTER — Encounter (HOSPITAL_BASED_OUTPATIENT_CLINIC_OR_DEPARTMENT_OTHER): Payer: Self-pay

## 2017-03-14 ENCOUNTER — Emergency Department (HOSPITAL_BASED_OUTPATIENT_CLINIC_OR_DEPARTMENT_OTHER)
Admission: EM | Admit: 2017-03-14 | Discharge: 2017-03-14 | Disposition: A | Discharge status: Home / Self Care | Payer: Self-pay | Attending: Emergency Medicine | Admitting: Emergency Medicine

## 2017-03-14 DIAGNOSIS — R110 Nausea: Secondary | ICD-10-CM | POA: Insufficient documentation

## 2017-03-14 DIAGNOSIS — F17210 Nicotine dependence, cigarettes, uncomplicated: Secondary | ICD-10-CM | POA: Insufficient documentation

## 2017-03-14 DIAGNOSIS — R0789 Other chest pain: Secondary | ICD-10-CM | POA: Insufficient documentation

## 2017-03-14 DIAGNOSIS — R0602 Shortness of breath: Secondary | ICD-10-CM | POA: Insufficient documentation

## 2017-03-14 LAB — BASIC METABOLIC PANEL
ANION GAP: 8 (ref 5–15)
BUN: 16 mg/dL (ref 6–20)
CALCIUM: 9.1 mg/dL (ref 8.9–10.3)
CO2: 23 mmol/L (ref 22–32)
CREATININE: 0.74 mg/dL (ref 0.44–1.00)
Chloride: 104 mmol/L (ref 101–111)
Glucose, Bld: 104 mg/dL — ABNORMAL HIGH (ref 65–99)
Potassium: 4 mmol/L (ref 3.5–5.1)
SODIUM: 135 mmol/L (ref 135–145)

## 2017-03-14 LAB — CBC
HCT: 37.4 % (ref 36.0–46.0)
HEMOGLOBIN: 12.8 g/dL (ref 12.0–15.0)
MCH: 30.3 pg (ref 26.0–34.0)
MCHC: 34.2 g/dL (ref 30.0–36.0)
MCV: 88.4 fL (ref 78.0–100.0)
PLATELETS: 272 10*3/uL (ref 150–400)
RBC: 4.23 MIL/uL (ref 3.87–5.11)
RDW: 12.5 % (ref 11.5–15.5)
WBC: 8.5 10*3/uL (ref 4.0–10.5)

## 2017-03-14 LAB — BRAIN NATRIURETIC PEPTIDE: B Natriuretic Peptide: 34.9 pg/mL (ref 0.0–100.0)

## 2017-03-14 LAB — PREGNANCY, URINE: PREG TEST UR: NEGATIVE

## 2017-03-14 LAB — D-DIMER, QUANTITATIVE (NOT AT ARMC): D DIMER QUANT: 0.46 ug{FEU}/mL (ref 0.00–0.50)

## 2017-03-14 LAB — TROPONIN I: Troponin I: <0.03 ng/mL (ref <0.03)

## 2017-03-14 NOTE — ED Provider Notes (Signed)
[facility] EMERGENCY DEPARTMENT Provider Note   CSN: [id number] Arrival date & time: 03/14/17  1503     History   Chief Complaint Chief Complaint  Patient presents with  . Chest Pain    HPI Heather Day is a 32 y.o. female.  32yo F w/ PMH including IVDU, endocarditis, Hep C who p/w chest pain. She was hospitalized for endocarditis in 2016, never had valve replaced but has resultant tricuspid regurg. She reports many months of intermittent burning, tight chest pain, palpitations and constant shortness of breath. Pain is worse when breathing in and laying on her side. She had an ECHO last week and has not yet had follow up cardiology appointment. She reports L toes tingling, L leg pain, R arm weakness, headaches, poor sleep, heart fluttering sensation when laying on her side, lightheadedness, cough, and nausea. She reports recent weight gain of 8lb. No vomiting or fevers. No h/o cancer, h/o blood clots, recent travel, or OCP use.   The history is provided by the patient.  Chest Pain      Past Medical History:  Diagnosis Date  . Hepatitis C     Patient Active Problem List   Diagnosis Date Noted  . Atypical chest pain 02/26/2017  . History of endocarditis 02/26/2017  . Tricuspid regurgitation 02/26/2017  . History of drug abuse 02/26/2017  . Smoking 02/26/2017    Past Surgical History:  Procedure Laterality Date  . CESAREAN SECTION      OB History    No data available       Home Medications    Prior to Admission medications   Not on File    Family History No family history on file.  Social History Social History   Tobacco Use  . Smoking status: Current Every Day Smoker    Packs/day: 1.00    Years: 20.00    Pack years: 20.00    Types: Cigarettes  . Smokeless tobacco: Never Used  Substance Use Topics  . Alcohol use: No  . Drug use: No     Allergies   Patient has no known allergies.   Review of Systems Review of Systems    Cardiovascular: Positive for chest pain.   All other systems reviewed and are negative except that which was mentioned in HPI   Physical Exam Updated Vital Signs BP (!) 127/54   Pulse 81   Temp 98.4 F (36.9 C) (Oral)   Resp 16   Ht 5\' 8"  (1.727 m)   Wt 128.8 kg (284 lb)   LMP 02/19/2017   SpO2 96%   BMI 43.18 kg/m   Physical Exam  Constitutional: She is oriented to person, place, and time. She appears well-developed and well-nourished. No distress.  HENT:  Head: Normocephalic and atraumatic.  Moist mucous membranes  Eyes: Conjunctivae are normal. Pupils are equal, round, and reactive to light.  Neck: Neck supple.  Cardiovascular: Normal rate and regular rhythm.  Murmur heard. Pulmonary/Chest: Effort normal and breath sounds normal.  Abdominal: Soft. Bowel sounds are normal. She exhibits no distension. There is no tenderness.  Musculoskeletal: She exhibits no edema.       Right lower leg: She exhibits no edema.       Left lower leg: She exhibits no edema.  Neurological: She is alert and oriented to person, place, and time.  Fluent speech  Skin: Skin is warm and dry.  Psychiatric: She has a normal mood and affect. Judgment normal.  Nursing note and vitals reviewed.  ED Treatments / Results  Labs (all labs ordered are listed, but only abnormal results are displayed) Labs Reviewed  BASIC METABOLIC PANEL - Abnormal; Notable for the following components:      Result Value   Glucose, Bld 104 (*)    All other components within normal limits  CBC  TROPONIN I  PREGNANCY, URINE  BRAIN NATRIURETIC PEPTIDE  D-DIMER, QUANTITATIVE (NOT AT [facility])    EKG  EKG Interpretation  Date/Time:  Wednesday March 14 2017 15:07:51 EST Ventricular Rate:  86 PR Interval:  124 QRS Duration: 82 QT Interval:  370 QTC Calculation: 442 R Axis:   29 Text Interpretation:  Normal sinus rhythm Nonspecific T wave abnormality Abnormal ECG S1Q3T3 pattern new from previous with deep Q  waves in III lower voltages in precordial leads compared to previous Confirmed by Frederick PeersLittle, Kennita Pavlovich [phone] on 03/14/2017 3:14:23 PM       Radiology Dg Chest 2 View  Result Date: 03/14/2017 CLINICAL DATA:  Chest pain for 2 days. EXAM: CHEST  2 VIEW COMPARISON:  02/09/2017 FINDINGS: The cardiomediastinal silhouette is within normal limits. There is mild anterior eventration of the right hemidiaphragm. The lungs are well inflated and clear. There is no evidence of pleural effusion or pneumothorax. No acute osseous abnormality is identified. IMPRESSION: No active cardiopulmonary disease. Electronically Signed   By: Sebastian AcheAllen  Grady M.D.   On: 03/14/2017 15:38    Procedures Procedures (including critical care time)  Medications Ordered in ED Medications - No data to display   Initial Impression / Assessment and Plan / ED Course  I have reviewed the triage vital signs and the nursing notes.  Pertinent labs & imaging results that were available during my care of the patient were reviewed by me and considered in my medical decision making (see chart for details).     PT w/ months of intermittent chest pain, h/o endocarditis w/ tricuspid regurg.  Nontoxic on exam with normal vital signs.  Afebrile with no recent fevers.  Her labs show normal troponin, normal CBC and BMP, normal BNP and d-dimer therefore doubt CHF or PE.  Chest x-ray unremarkable.  EKG does have some inferior changes including Q waves but given the patient has low HEART score for coronary artery disease especially given age.  Given the chronicity of the patient's complaints, I doubt ACS.  I have instructed to follow closely with her cardiologist.  Extensively reviewed return precautions and patient discharged in satisfactory condition.  Final Clinical Impressions(s) / ED Diagnoses   Final diagnoses:  Atypical chest pain  Shortness of breath  Nausea    ED Discharge Orders    None       Raequon Catanzaro, Ambrose Finlandachel Morgan, MD 03/15/17 0006

## 2017-03-14 NOTE — ED Triage Notes (Signed)
C/o CP x 1 week-NAD-steady gait 

## 2017-03-16 ENCOUNTER — Encounter: Payer: Self-pay | Admitting: Cardiology

## 2017-03-16 ENCOUNTER — Ambulatory Visit (INDEPENDENT_AMBULATORY_CARE_PROVIDER_SITE_OTHER): Payer: Self-pay | Admitting: Cardiology

## 2017-03-16 VITALS — BP 100/66 | HR 87 | Ht 68.0 in | Wt 283.1 lb

## 2017-03-16 DIAGNOSIS — I361 Nonrheumatic tricuspid (valve) insufficiency: Secondary | ICD-10-CM

## 2017-03-16 DIAGNOSIS — Z87898 Personal history of other specified conditions: Secondary | ICD-10-CM

## 2017-03-16 DIAGNOSIS — F1911 Other psychoactive substance abuse, in remission: Secondary | ICD-10-CM

## 2017-03-16 DIAGNOSIS — F17200 Nicotine dependence, unspecified, uncomplicated: Secondary | ICD-10-CM

## 2017-03-16 DIAGNOSIS — R109 Unspecified abdominal pain: Secondary | ICD-10-CM | POA: Insufficient documentation

## 2017-03-16 DIAGNOSIS — R1013 Epigastric pain: Secondary | ICD-10-CM

## 2017-03-16 DIAGNOSIS — Z8679 Personal history of other diseases of the circulatory system: Secondary | ICD-10-CM

## 2017-03-16 DIAGNOSIS — R0789 Other chest pain: Secondary | ICD-10-CM

## 2017-03-16 MED ORDER — OMEPRAZOLE 20 MG PO CPDR: 20.0000 mg | DELAYED_RELEASE_CAPSULE | Freq: Two times a day (BID) | ORAL | 11 refills | Status: DC

## 2017-03-16 NOTE — Addendum Note (Signed)
Addended by: Arville CareHUNT, AMANDA N on: 03/16/2017 09:06 AM   Modules accepted: Orders

## 2017-03-16 NOTE — Progress Notes (Signed)
Cardiology Office Note:    Date:  03/16/2017   ID:  Heather Day, DOB [birth year], MRN [id number]  PCP:  Default, Provider, MD  Cardiologist:  [name], MD    Referring MD: No ref. provider found   Chief Complaint  Patient presents with  . 1 month follow up  She is not feeling well  History of Present Illness:    Heather Day is a 32 y.o. female with history of endocarditis and drug abuse.  Now she is clean she is not been using drugs for at least 6 months.  She presented to me first time about a month ago with request to have her tricuspid valve evaluated.  Apparently when she was in the hospital because of endocarditis was few years ago she was told that she required tricuspid valve replacement and she never comply with this he she left hospital and continue using drugs.  Now she is clean and she would like to be checked.  Since I seen her last time she had a few visits in the emergency room and the purpose of this visit was to control her pain in the abdomen.  She complained of having epigastric pain with some radiation towards the right side of her abdomen.  On the physical examination she is soft mildly tender in the epigastrium and mildly tender in the right upper quadrant.  There is no guarding there is no rebound tenderness.  She denies having any fever.  She was in the emergency room to the days ago and I reviewed that visit.  There was no signs of infection, her proBNP was normal d-dimer was negative troponin was normal.  He also complained of having some shortness of breath and also some weight gain.  Have echocardiogram done which showed normal left ventricular ejection fraction she did have moderate tricuspid regurgitation but normal IVC.  Past Medical History:  Diagnosis Date  . Hepatitis C     Past Surgical History:  Procedure Laterality Date  . CESAREAN SECTION      Current Medications: No outpatient medications have been marked as taking for the 03/16/17 encounter  (Office Visit) with [name], MD.     Allergies:   Patient has no known allergies.   Social History   Socioeconomic History  . Marital status: Single    Spouse name: None  . Number of children: None  . Years of education: None  . Highest education level: None  Social Needs  . Financial resource strain: None  . Food insecurity - worry: None  . Food insecurity - inability: None  . Transportation needs - medical: None  . Transportation needs - non-medical: None  Occupational History  . None  Tobacco Use  . Smoking status: Current Every Day Smoker    Packs/day: 1.00    Years: 20.00    Pack years: 20.00    Types: Cigarettes  . Smokeless tobacco: Never Used  Substance and Sexual Activity  . Alcohol use: No  . Drug use: No  . Sexual activity: None  Other Topics Concern  . None  Social History Narrative  . None     Family History: The patient's family history is not on file. ROS:   Please see the history of present illness.    All 14 point review of systems negative except as described per history of present illness  EKGs/Labs/Other Studies Reviewed:      Recent Labs: 02/09/2017: ALT 13 03/14/2017: B Natriuretic Peptide 34.9; BUN 16; Creatinine,  Ser 0.74; Hemoglobin 12.8; Platelets 272; Potassium 4.0; Sodium 135  Recent Lipid Panel    Component Value Date/Time   CHOL 214 (H) 02/26/2017 1425   TRIG 184 (H) 02/26/2017 1425   HDL 45 02/26/2017 1425   CHOLHDL 4.8 (H) 02/26/2017 1425   LDLCALC 132 (H) 02/26/2017 1425    Physical Exam:    VS:  BP 100/66   Pulse 87   Ht 5\' 8"  (1.727 m)   Wt 283 lb 1.9 oz (128.4 kg)   LMP 02/19/2017   SpO2 99%   BMI 43.05 kg/m     Wt Readings from Last 3 Encounters:  03/16/17 283 lb 1.9 oz (128.4 kg)  03/14/17 284 lb (128.8 kg)  02/26/17 276 lb (125.2 kg)     GEN:  Well nourished, well developed in no acute distress HEENT: Normal NECK: No JVD; No carotid bruits LYMPHATICS: No lymphadenopathy CARDIAC: RRR, no  murmurs, no rubs, no gallops RESPIRATORY:  Clear to auscultation without rales, wheezing or rhonchi  ABDOMEN: Soft, non-tender, non-distended MUSCULOSKELETAL:  No edema; No deformity  SKIN: Warm and dry LOWER EXTREMITIES: no swelling NEUROLOGIC:  Alert and oriented x 3 PSYCHIATRIC:  Normal affect   ASSESSMENT:    1. Non-rheumatic tricuspid valve insufficiency   2. Atypical chest pain   3. History of drug abuse   4. History of endocarditis   5. Smoking   6. Epigastric pain    PLAN:    In order of problems listed above:  1. Nonrheumatic tricuspid valve insufficiency: Assessed as moderate, IVC normal.  No indications for intervention.  We will follow up.  She is aware of the fact that she need to take antibiotic prophylaxis for endocarditis before any dental procedures. 2. Atypical chest pain: Very atypical I doubt very much is related to her heart I think is most likely related to her stomach or gallbladder. 3. History of drug abuse: She states clean. 4. History of endocarditis: No evidence of active infection now. 5. Smoking: I advised her to quit. 6. Epigastric pain.  I will ask her to start taking omeprazole 20 mg twice daily, will schedule her to see our internal medicine colleagues.  I will schedule her also to have gallbladder ultrasound there is some tenderness in the right upper quadrant as well.  She will follow-up with me in within about 2 or sooner she has a problem   Medication Adjustments/Labs and Tests Ordered: Current medicines are reviewed at length with the patient today.  Concerns regarding medicines are outlined above.  No orders of the defined types were placed in this encounter.  Medication changes: No orders of the defined types were placed in this encounter.   Signed, Georgeanna Leaobert J. Azarion Hove, MD, [facility] 03/16/2017 8:53 AM    [facility]

## 2017-03-16 NOTE — Patient Instructions (Signed)
Medication Instructions:  Your physician has recommended you make the following change in your medication:  1) Start Omeprazole 20 mg 1 capsule twice daily  Labwork: None ordered  Testing/Procedures: Abdominal Ultra Sound  Follow-Up: Your physician recommends that you schedule a follow-up appointment in: 2 months with Dr. Bing MatterKrasowski  We have sent a referral to the Family Medicine office on the 2nd floor [facility] at [facility]. They will contact you about setting up an appointment.   Any Other Special Instructions Will Be Listed Below (If Applicable).     If you need a refill on your cardiac medications before your next appointment, please call your pharmacy.

## 2017-03-17 ENCOUNTER — Ambulatory Visit (HOSPITAL_BASED_OUTPATIENT_CLINIC_OR_DEPARTMENT_OTHER): Admission: RE | Admit: 2017-03-17 | Payer: Self-pay | Source: Ambulatory Visit

## 2017-03-19 ENCOUNTER — Other Ambulatory Visit (HOSPITAL_BASED_OUTPATIENT_CLINIC_OR_DEPARTMENT_OTHER): Payer: Self-pay

## 2017-03-29 ENCOUNTER — Ambulatory Visit: Payer: Self-pay | Admitting: Cardiology

## 2017-04-19 ENCOUNTER — Telehealth: Payer: Self-pay | Admitting: *Deleted

## 2017-04-19 NOTE — Telephone Encounter (Signed)
Received request for Medical records from [place] [employer], forwarded to [place] for email/scan/SLS 02/07    

## 2017-05-21 ENCOUNTER — Ambulatory Visit: Payer: Self-pay | Admitting: Cardiology

## 2017-05-24 ENCOUNTER — Ambulatory Visit: Payer: Self-pay | Admitting: Cardiology

## 2017-05-24 MED FILL — OMEPRAZOLE 20 MG CAP: 20 | 30 days supply | Qty: 60 | Fill #0

## 2017-05-29 ENCOUNTER — Ambulatory Visit: Payer: Self-pay | Admitting: Cardiology

## 2017-06-01 ENCOUNTER — Encounter: Payer: Self-pay | Admitting: Cardiology

## 2017-06-01 ENCOUNTER — Ambulatory Visit (INDEPENDENT_AMBULATORY_CARE_PROVIDER_SITE_OTHER): Payer: Self-pay | Admitting: Cardiology

## 2017-06-01 VITALS — BP 110/60 | HR 93 | Ht 68.0 in | Wt 282.4 lb

## 2017-06-01 DIAGNOSIS — F17200 Nicotine dependence, unspecified, uncomplicated: Secondary | ICD-10-CM

## 2017-06-01 DIAGNOSIS — Z8679 Personal history of other diseases of the circulatory system: Secondary | ICD-10-CM

## 2017-06-01 DIAGNOSIS — F1911 Other psychoactive substance abuse, in remission: Secondary | ICD-10-CM

## 2017-06-01 DIAGNOSIS — R0789 Other chest pain: Secondary | ICD-10-CM

## 2017-06-01 DIAGNOSIS — Z87898 Personal history of other specified conditions: Secondary | ICD-10-CM

## 2017-06-01 NOTE — Progress Notes (Signed)
Cardiology Office Note:    Date:  06/01/2017   ID:  Heather LotAshly Drudge, DOB 05/28/1985, MRN [id number]  PCP:  Default, Provider, MD  Cardiologist:  Gypsy Balsamobert Sidni Fusco, MD    Referring MD: No ref. provider found   Chief Complaint  Patient presents with  . 2 month follow up  Doing well  History of Present Illness:    Heather Day is a 32 y.o. female with history of endocarditis as well as drug abuse.  Few positive developments.  She quit smoking she also started exercising on the regular basis and she changed her diet she is disappointed because she did not lose significant amount of weight.  Overall from cardiac standpoint review she is doing well we spent the entire visit talking about exercises on the regular basis good diet and good life habits.  Past Medical History:  Diagnosis Date  . Hepatitis C     Past Surgical History:  Procedure Laterality Date  . CESAREAN SECTION      Current Medications: No outpatient medications have been marked as taking for the 06/01/17 encounter (Office Visit) with Georgeanna LeaKrasowski, Mycal Conde J, MD.     Allergies:   Patient has no known allergies.   Social History   Socioeconomic History  . Marital status: Single    Spouse name: Not on file  . Number of children: Not on file  . Years of education: Not on file  . Highest education level: Not on file  Occupational History  . Not on file  Social Needs  . Financial resource strain: Not on file  . Food insecurity:    Worry: Not on file    Inability: Not on file  . Transportation needs:    Medical: Not on file    Non-medical: Not on file  Tobacco Use  . Smoking status: Former Smoker    Packs/day: 1.00    Years: 20.00    Pack years: 20.00    Types: Cigarettes  . Smokeless tobacco: Never Used  Substance and Sexual Activity  . Alcohol use: No  . Drug use: No  . Sexual activity: Not on file  Lifestyle  . Physical activity:    Days per week: Not on file    Minutes per session: Not on file  . Stress:  Not on file  Relationships  . Social connections:    Talks on phone: Not on file    Gets together: Not on file    Attends religious service: Not on file    Active member of club or organization: Not on file    Attends meetings of clubs or organizations: Not on file    Relationship status: Not on file  Other Topics Concern  . Not on file  Social History Narrative  . Not on file     Family History: The patient's family history includes Healthy in her father; Hypotension in her mother; Hypothyroidism in her mother. ROS:   Please see the history of present illness.    All 14 point review of systems negative except as described per history of present illness  EKGs/Labs/Other Studies Reviewed:      Recent Labs: 02/09/2017: ALT 13 03/14/2017: B Natriuretic Peptide 34.9; BUN 16; Creatinine, Ser 0.74; Hemoglobin 12.8; Platelets 272; Potassium 4.0; Sodium 135  Recent Lipid Panel    Component Value Date/Time   CHOL 214 (H) 02/26/2017 1425   TRIG 184 (H) 02/26/2017 1425   HDL 45 02/26/2017 1425   CHOLHDL 4.8 (H) 02/26/2017 1425   LDLCALC  132 (H) 02/26/2017 1425    Physical Exam:    VS:  Ht 5\' 8"  (1.727 m)   Wt 282 lb 6.4 oz (128.1 kg)   BMI 42.94 kg/m     Wt Readings from Last 3 Encounters:  06/01/17 282 lb 6.4 oz (128.1 kg)  03/16/17 283 lb 1.9 oz (128.4 kg)  03/14/17 284 lb (128.8 kg)     GEN:  Well nourished, well developed in no acute distress HEENT: Normal NECK: No JVD; No carotid bruits LYMPHATICS: No lymphadenopathy CARDIAC: RRR, no murmurs, no rubs, no gallops RESPIRATORY:  Clear to auscultation without rales, wheezing or rhonchi  ABDOMEN: Soft, non-tender, non-distended MUSCULOSKELETAL:  No edema; No deformity  SKIN: Warm and dry LOWER EXTREMITIES: no swelling NEUROLOGIC:  Alert and oriented x 3 PSYCHIATRIC:  Normal affect   ASSESSMENT:    1. Atypical chest pain   2. History of drug abuse   3. History of endocarditis   4. Smoking    PLAN:    In  order of problems listed above:  1. Atypical chest pain: Denies having any. 2. History of drug abuse: She is clean 3. History of endocarditis: Doing well.  Asymptomatic knows about endocarditis prophylaxis 4. Smoking: She quit smoking and I congratulated her for it.  I see her back in my office in about 5-6 months or sooner if she has a problem   Medication Adjustments/Labs and Tests Ordered: Current medicines are reviewed at length with the patient today.  Concerns regarding medicines are outlined above.  No orders of the defined types were placed in this encounter.  Medication changes: No orders of the defined types were placed in this encounter.   Signed, [name], MD, [facility] 06/01/2017 10:35 AM    [facility]

## 2017-06-01 NOTE — Patient Instructions (Signed)
Medication Instructions:  Your physician recommends that you continue on your current medications as directed. Please refer to the Current Medication list given to you today.   Labwork: None  Testing/Procedures: None  Follow-Up: Your physician wants you to follow-up in: 6 month. You will receive a reminder letter in the mail two months in advance. If you don't receive a letter, please call our office to schedule the follow-up appointment.   Any Other Special Instructions Will Be Listed Below (If Applicable).     If you need a refill on your cardiac medications before your next appointment, please call your pharmacy.

## 2017-07-03 ENCOUNTER — Encounter (HOSPITAL_BASED_OUTPATIENT_CLINIC_OR_DEPARTMENT_OTHER): Payer: Self-pay | Admitting: Emergency Medicine

## 2017-07-03 ENCOUNTER — Other Ambulatory Visit: Payer: Self-pay

## 2017-07-03 DIAGNOSIS — K3580 Unspecified acute appendicitis: Principal | ICD-10-CM | POA: Insufficient documentation

## 2017-07-03 DIAGNOSIS — F1911 Other psychoactive substance abuse, in remission: Secondary | ICD-10-CM | POA: Insufficient documentation

## 2017-07-03 DIAGNOSIS — Z79899 Other long term (current) drug therapy: Secondary | ICD-10-CM | POA: Insufficient documentation

## 2017-07-03 DIAGNOSIS — Z8619 Personal history of other infectious and parasitic diseases: Secondary | ICD-10-CM | POA: Insufficient documentation

## 2017-07-03 DIAGNOSIS — K219 Gastro-esophageal reflux disease without esophagitis: Secondary | ICD-10-CM | POA: Insufficient documentation

## 2017-07-03 DIAGNOSIS — Z6836 Body mass index (BMI) 36.0-36.9, adult: Secondary | ICD-10-CM | POA: Insufficient documentation

## 2017-07-03 NOTE — ED Triage Notes (Signed)
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

## 2017-07-04 ENCOUNTER — Emergency Department (HOSPITAL_BASED_OUTPATIENT_CLINIC_OR_DEPARTMENT_OTHER): Payer: Self-pay

## 2017-07-04 ENCOUNTER — Encounter (HOSPITAL_BASED_OUTPATIENT_CLINIC_OR_DEPARTMENT_OTHER): Payer: Self-pay | Admitting: Emergency Medicine

## 2017-07-04 ENCOUNTER — Encounter (HOSPITAL_COMMUNITY)
Admission: EM | Disposition: A | Discharge status: Home / Self Care | Payer: Self-pay | Source: Home / Self Care | Attending: Emergency Medicine

## 2017-07-04 ENCOUNTER — Observation Stay (HOSPITAL_COMMUNITY): Payer: Self-pay | Admitting: Certified Registered Nurse Anesthetist

## 2017-07-04 ENCOUNTER — Observation Stay (HOSPITAL_BASED_OUTPATIENT_CLINIC_OR_DEPARTMENT_OTHER)
Admission: EM | Admit: 2017-07-04 | Discharge: 2017-07-04 | Disposition: A | Discharge status: Home / Self Care | Payer: Self-pay | Attending: Surgery | Admitting: Surgery

## 2017-07-04 DIAGNOSIS — K8020 Calculus of gallbladder without cholecystitis without obstruction: Secondary | ICD-10-CM

## 2017-07-04 DIAGNOSIS — K3580 Unspecified acute appendicitis: Secondary | ICD-10-CM | POA: Diagnosis present

## 2017-07-04 HISTORY — DX: Other psychoactive substance abuse, uncomplicated: F19.10

## 2017-07-04 HISTORY — PX: LAPAROSCOPIC APPENDECTOMY: SHX408

## 2017-07-04 HISTORY — DX: Heartburn: R12

## 2017-07-04 LAB — COMPREHENSIVE METABOLIC PANEL
ALT: 12 U/L — ABNORMAL LOW (ref 14–54)
ANION GAP: 6 (ref 5–15)
AST: 21 U/L (ref 15–41)
Albumin: 3.8 g/dL (ref 3.5–5.0)
Alkaline Phosphatase: 39 U/L (ref 38–126)
BUN: 16 mg/dL (ref 6–20)
CO2: 21 mmol/L — AB (ref 22–32)
Calcium: 8.6 mg/dL — ABNORMAL LOW (ref 8.9–10.3)
Chloride: 106 mmol/L (ref 101–111)
Creatinine, Ser: 0.66 mg/dL (ref 0.44–1.00)
Glucose, Bld: 144 mg/dL — ABNORMAL HIGH (ref 65–99)
Potassium: 3.6 mmol/L (ref 3.5–5.1)
SODIUM: 133 mmol/L — AB (ref 135–145)
TOTAL PROTEIN: 6.6 g/dL (ref 6.5–8.1)
Total Bilirubin: 0.4 mg/dL (ref 0.3–1.2)

## 2017-07-04 LAB — CBC WITH DIFFERENTIAL/PLATELET
BASOS ABS: 0 10*3/uL (ref 0.0–0.1)
Basophils Relative: 0 %
EOS ABS: 0 10*3/uL (ref 0.0–0.7)
EOS PCT: 0 %
HCT: 35.3 % — ABNORMAL LOW (ref 36.0–46.0)
Hemoglobin: 12.3 g/dL (ref 12.0–15.0)
Lymphocytes Relative: 7 %
Lymphs Abs: 1 10*3/uL (ref 0.7–4.0)
MCH: 30.8 pg (ref 26.0–34.0)
MCHC: 34.8 g/dL (ref 30.0–36.0)
MCV: 88.3 fL (ref 78.0–100.0)
Monocytes Absolute: 0.8 10*3/uL (ref 0.1–1.0)
Monocytes Relative: 5 %
Neutro Abs: 12 10*3/uL — ABNORMAL HIGH (ref 1.7–7.7)
Neutrophils Relative %: 88 %
PLATELETS: 233 10*3/uL (ref 150–400)
RBC: 4 MIL/uL (ref 3.87–5.11)
RDW: 12.4 % (ref 11.5–15.5)
WBC: 13.8 10*3/uL — AB (ref 4.0–10.5)

## 2017-07-04 LAB — URINALYSIS, ROUTINE W REFLEX MICROSCOPIC
BILIRUBIN URINE: NEGATIVE
GLUCOSE, UA: NEGATIVE mg/dL
Hgb urine dipstick: NEGATIVE
KETONES UR: NEGATIVE mg/dL
LEUKOCYTES UA: NEGATIVE
NITRITE: NEGATIVE
PROTEIN: NEGATIVE mg/dL
Specific Gravity, Urine: >1.03 — ABNORMAL HIGH (ref 1.005–1.030)
pH: 5.5 (ref 5.0–8.0)

## 2017-07-04 LAB — PREGNANCY, URINE: Preg Test, Ur: NEGATIVE

## 2017-07-04 LAB — TROPONIN I

## 2017-07-04 LAB — LIPASE, BLOOD: Lipase: 30 U/L (ref 11–51)

## 2017-07-04 SURGERY — APPENDECTOMY, LAPAROSCOPIC
Anesthesia: General

## 2017-07-04 MED ORDER — FENTANYL CITRATE (PF) 100 MCG/2ML IJ SOLN
INTRAMUSCULAR | Status: AC
  Filled 2017-07-04: qty 2

## 2017-07-04 MED ORDER — ONDANSETRON HCL 4 MG/2ML IJ SOLN: 4.0000 mg | Freq: Four times a day (QID) | INTRAMUSCULAR | Status: DC | PRN

## 2017-07-04 MED ORDER — ONDANSETRON HCL 4 MG/2ML IJ SOLN
INTRAMUSCULAR | Status: DC | PRN
  Administered 2017-07-04: 4 mg via INTRAVENOUS

## 2017-07-04 MED ORDER — SODIUM CHLORIDE 0.9 % IV SOLN: 2.0000 g | INTRAVENOUS | Status: DC

## 2017-07-04 MED ORDER — FENTANYL CITRATE (PF) 100 MCG/2ML IJ SOLN
25.0000 ug | INTRAMUSCULAR | Status: AC
  Administered 2017-07-04: 25 ug via INTRAVENOUS

## 2017-07-04 MED ORDER — SODIUM CHLORIDE 0.9 % IV SOLN
INTRAVENOUS | Status: DC
  Administered 2017-07-04: 08:00:00 via INTRAVENOUS

## 2017-07-04 MED ORDER — SCOPOLAMINE 1 MG/3DAYS TD PT72
MEDICATED_PATCH | TRANSDERMAL | Status: AC
  Filled 2017-07-04: qty 1

## 2017-07-04 MED ORDER — METRONIDAZOLE IN NACL 5-0.79 MG/ML-% IV SOLN
500.0000 mg | Freq: Once | INTRAVENOUS | Status: AC
Start: 2017-07-04 — End: 2017-07-04
  Administered 2017-07-04: 500 mg via INTRAVENOUS
  Filled 2017-07-04 (×2): qty 100

## 2017-07-04 MED ORDER — ACETAMINOPHEN 325 MG PO TABS: 650.0000 mg | ORAL_TABLET | Freq: Four times a day (QID) | ORAL | Status: DC | PRN

## 2017-07-04 MED ORDER — LACTATED RINGERS IV SOLN
INTRAVENOUS | Status: DC | PRN
  Administered 2017-07-04: 15:00:00 via INTRAVENOUS

## 2017-07-04 MED ORDER — MIDAZOLAM HCL 5 MG/5ML IJ SOLN
INTRAMUSCULAR | Status: DC | PRN
  Administered 2017-07-04: 2 mg via INTRAVENOUS

## 2017-07-04 MED ORDER — ROCURONIUM BROMIDE 50 MG/5ML IV SOSY
PREFILLED_SYRINGE | INTRAVENOUS | Status: DC | PRN
  Administered 2017-07-04: 40 mg via INTRAVENOUS
  Administered 2017-07-04: 10 mg via INTRAVENOUS

## 2017-07-04 MED ORDER — PROPOFOL 10 MG/ML IV BOLUS
INTRAVENOUS | Status: AC
  Filled 2017-07-04: qty 20

## 2017-07-04 MED ORDER — PANTOPRAZOLE SODIUM 40 MG PO TBEC
40.0000 mg | DELAYED_RELEASE_TABLET | Freq: Every day | ORAL | Status: DC
  Administered 2017-07-04: 40 mg via ORAL
  Filled 2017-07-04: qty 1

## 2017-07-04 MED ORDER — OXYCODONE HCL 5 MG PO TABS
5.0000 mg | ORAL_TABLET | ORAL | Status: DC | PRN
  Administered 2017-07-04: 5 mg via ORAL
  Filled 2017-07-04: qty 1

## 2017-07-04 MED ORDER — FENTANYL CITRATE (PF) 250 MCG/5ML IJ SOLN
INTRAMUSCULAR | Status: AC
  Filled 2017-07-04: qty 5

## 2017-07-04 MED ORDER — DIPHENHYDRAMINE HCL 25 MG PO CAPS: 25.0000 mg | ORAL_CAPSULE | Freq: Four times a day (QID) | ORAL | Status: DC | PRN

## 2017-07-04 MED ORDER — SCOPOLAMINE 1 MG/3DAYS TD PT72SCOPOLAMINE 1 MG/3DAYS
1.0000 | MEDICATED_PATCH | TRANSDERMAL | Status: DC
Start: 2017-07-04 — End: 2017-07-04
  Administered 2017-07-04: 1.5 mg via TRANSDERMAL

## 2017-07-04 MED ORDER — LIDOCAINE 2% (20 MG/ML) 5 ML SYRINGE
INTRAMUSCULAR | Status: AC
  Filled 2017-07-04: qty 5

## 2017-07-04 MED ORDER — CEFTRIAXONE SODIUM 2 G IJ SOLR
2.0000 g | Freq: Once | INTRAMUSCULAR | Status: AC
  Administered 2017-07-04: 2 g via INTRAVENOUS
  Filled 2017-07-04: qty 20

## 2017-07-04 MED ORDER — BUPIVACAINE LIPOSOME 1.3 % IJ SUSP
20.0000 mL | Freq: Once | INTRAMUSCULAR | Status: DC
  Filled 2017-07-04: qty 20

## 2017-07-04 MED ORDER — ONDANSETRON HCL 4 MG/2ML IJ SOLN
INTRAMUSCULAR | Status: AC
Start: 2017-07-04 — End: ?
  Filled 2017-07-04: qty 2

## 2017-07-04 MED ORDER — BUPIVACAINE LIPOSOME 1.3 % IJ SUSP
INTRAMUSCULAR | Status: DC | PRN
  Administered 2017-07-04: 20 mL

## 2017-07-04 MED ORDER — CHLORHEXIDINE GLUCONATE CLOTH 2 % EX PADS
6.0000 | MEDICATED_PAD | Freq: Once | CUTANEOUS | Status: AC
  Administered 2017-07-04: 6 via TOPICAL

## 2017-07-04 MED ORDER — IOPAMIDOL (ISOVUE-300) INJECTION 61%
100.0000 mL | Freq: Once | INTRAVENOUS | Status: AC | PRN
  Administered 2017-07-04: 100 mL via INTRAVENOUS

## 2017-07-04 MED ORDER — ACETAMINOPHEN 650 MG RE SUPP: 650.0000 mg | Freq: Four times a day (QID) | RECTAL | Status: DC | PRN

## 2017-07-04 MED ORDER — HYDRALAZINE HCL 20 MG/ML IJ SOLN: 10.0000 mg | INTRAMUSCULAR | Status: DC | PRN

## 2017-07-04 MED ORDER — PROMETHAZINE HCL 25 MG/ML IJ SOLN: 6.2500 mg | INTRAMUSCULAR | Status: DC | PRN

## 2017-07-04 MED ORDER — DIPHENHYDRAMINE HCL 50 MG/ML IJ SOLN
25.0000 mg | Freq: Four times a day (QID) | INTRAMUSCULAR | Status: DC | PRN
  Administered 2017-07-04: 6.25 mg via INTRAVENOUS

## 2017-07-04 MED ORDER — LACTATED RINGERS IR SOLN
Status: DC | PRN
  Administered 2017-07-04: 1000 mL

## 2017-07-04 MED ORDER — DIPHENHYDRAMINE HCL 50 MG/ML IJ SOLN
INTRAMUSCULAR | Status: AC
  Filled 2017-07-04: qty 1

## 2017-07-04 MED ORDER — DEXAMETHASONE SODIUM PHOSPHATE 10 MG/ML IJ SOLN
INTRAMUSCULAR | Status: DC | PRN
  Administered 2017-07-04: 10 mg via INTRAVENOUS

## 2017-07-04 MED ORDER — ROCURONIUM BROMIDE 10 MG/ML (PF) SYRINGE
PREFILLED_SYRINGE | INTRAVENOUS | Status: AC
  Filled 2017-07-04: qty 5

## 2017-07-04 MED ORDER — DEXAMETHASONE SODIUM PHOSPHATE 10 MG/ML IJ SOLN
INTRAMUSCULAR | Status: AC
Start: 2017-07-04 — End: ?
  Filled 2017-07-04: qty 1

## 2017-07-04 MED ORDER — MIDAZOLAM HCL 2 MG/2ML IJ SOLN
INTRAMUSCULAR | Status: AC
  Filled 2017-07-04: qty 2

## 2017-07-04 MED ORDER — ONDANSETRON 4 MG PO TBDP: 4.0000 mg | ORAL_TABLET | Freq: Four times a day (QID) | ORAL | Status: DC | PRN

## 2017-07-04 MED ORDER — SUCCINYLCHOLINE CHLORIDE 200 MG/10ML IV SOSY
PREFILLED_SYRINGE | INTRAVENOUS | Status: AC
  Filled 2017-07-04: qty 10

## 2017-07-04 MED ORDER — ONDANSETRON 4 MG PO TBDP: 4.0000 mg | ORAL_TABLET | Freq: Four times a day (QID) | ORAL | 0 refills | Status: DC | PRN

## 2017-07-04 MED ORDER — OXYCODONE HCL 5 MG PO TABS: 5.0000 mg | ORAL_TABLET | Freq: Four times a day (QID) | ORAL | 0 refills | Status: DC | PRN

## 2017-07-04 MED ORDER — PROPOFOL 10 MG/ML IV BOLUS
INTRAVENOUS | Status: DC | PRN
  Administered 2017-07-04: 160 mg via INTRAVENOUS

## 2017-07-04 MED ORDER — FENTANYL CITRATE (PF) 100 MCG/2ML IJ SOLN
25.0000 ug | INTRAMUSCULAR | Status: DC | PRN
  Administered 2017-07-04 (×3): 50 ug via INTRAVENOUS

## 2017-07-04 MED ORDER — METOPROLOL TARTRATE 5 MG/5ML IV SOLN: 5.0000 mg | Freq: Four times a day (QID) | INTRAVENOUS | Status: DC | PRN

## 2017-07-04 MED ORDER — FENTANYL CITRATE (PF) 100 MCG/2ML IJ SOLN
100.0000 ug | INTRAMUSCULAR | Status: DC | PRN
  Administered 2017-07-04 (×2): 100 ug via INTRAVENOUS
  Filled 2017-07-04 (×2): qty 2

## 2017-07-04 MED ORDER — FENTANYL CITRATE (PF) 100 MCG/2ML IJ SOLN
INTRAMUSCULAR | Status: DC | PRN
  Administered 2017-07-04 (×3): 50 ug via INTRAVENOUS

## 2017-07-04 MED ORDER — CHLORHEXIDINE GLUCONATE CLOTH 2 % EX PADS: 6.0000 | MEDICATED_PAD | Freq: Once | CUTANEOUS | Status: DC

## 2017-07-04 MED ORDER — LIDOCAINE 2% (20 MG/ML) 5 ML SYRINGE
INTRAMUSCULAR | Status: DC | PRN
  Administered 2017-07-04: 100 mg via INTRAVENOUS

## 2017-07-04 MED ORDER — SUCCINYLCHOLINE CHLORIDE 200 MG/10ML IV SOSY
PREFILLED_SYRINGE | INTRAVENOUS | Status: DC | PRN
  Administered 2017-07-04: 120 mg via INTRAVENOUS

## 2017-07-04 MED ORDER — SUGAMMADEX SODIUM 200 MG/2ML IV SOLN
INTRAVENOUS | Status: DC | PRN
  Administered 2017-07-04: 200 mg via INTRAVENOUS

## 2017-07-04 MED ORDER — SUGAMMADEX SODIUM 200 MG/2ML IV SOLN
INTRAVENOUS | Status: AC
  Filled 2017-07-04: qty 2

## 2017-07-04 MED ORDER — METRONIDAZOLE IN NACL 5-0.79 MG/ML-% IV SOLN
500.0000 mg | Freq: Three times a day (TID) | INTRAVENOUS | Status: DC
  Administered 2017-07-04: 500 mg via INTRAVENOUS
  Filled 2017-07-04: qty 100

## 2017-07-04 MED ORDER — ONDANSETRON HCL 4 MG/2ML IJ SOLN
4.0000 mg | Freq: Once | INTRAMUSCULAR | Status: AC
Start: 2017-07-04 — End: 2017-07-04
  Administered 2017-07-04: 4 mg via INTRAVENOUS
  Filled 2017-07-04: qty 2

## 2017-07-04 SURGICAL SUPPLY — 37 items
APPLIER CLIP ROT 10 11.4 M/L (STAPLE)
CABLE HIGH FREQUENCY MONO STRZ (ELECTRODE) ×2 IMPLANT
CHLORAPREP W/TINT 26ML (MISCELLANEOUS) ×2 IMPLANT
CLIP APPLIE ROT 10 11.4 M/L (STAPLE) IMPLANT
COVER SURGICAL LIGHT HANDLE (MISCELLANEOUS) ×2 IMPLANT
CUTTER FLEX LINEAR 45M (STAPLE) ×2 IMPLANT
DECANTER SPIKE VIAL GLASS SM (MISCELLANEOUS) IMPLANT
DERMABOND ADVANCED (GAUZE/BANDAGES/DRESSINGS) ×1
DERMABOND ADVANCED .7 DNX12 (GAUZE/BANDAGES/DRESSINGS) ×1 IMPLANT
DRAPE LAPAROSCOPIC ABDOMINAL (DRAPES) IMPLANT
ELECT REM PT RETURN 15FT ADLT (MISCELLANEOUS) ×2 IMPLANT
ENDOLOOP SUT PDS II  0 18 (SUTURE)
ENDOLOOP SUT PDS II 0 18 (SUTURE) IMPLANT
GLOVE BIOGEL M 8.0 STRL (GLOVE) ×2 IMPLANT
GOWN STRL REUS W/TWL XL LVL3 (GOWN DISPOSABLE) ×2 IMPLANT
KIT BASIN OR (CUSTOM PROCEDURE TRAY) ×2 IMPLANT
PAD POSITIONING PINK XL (MISCELLANEOUS) ×2 IMPLANT
POUCH RETRIEVAL ECOSAC 10 (ENDOMECHANICALS) IMPLANT
POUCH RETRIEVAL ECOSAC 10MM (ENDOMECHANICALS)
POUCH SPECIMEN RETRIEVAL 10MM (ENDOMECHANICALS) ×2 IMPLANT
RELOAD 45 VASCULAR/THIN (ENDOMECHANICALS) ×2 IMPLANT
RELOAD STAPLE TA45 3.5 REG BLU (ENDOMECHANICALS) IMPLANT
SCISSORS LAP 5X45 EPIX DISP (ENDOMECHANICALS) ×2 IMPLANT
SET IRRIG TUBING LAPAROSCOPIC (IRRIGATION / IRRIGATOR) ×2 IMPLANT
SHEARS HARMONIC ACE PLUS 45CM (MISCELLANEOUS) ×2 IMPLANT
SLEEVE XCEL OPT CAN 5 100 (ENDOMECHANICALS) ×2 IMPLANT
STAPLER VISISTAT 35W (STAPLE) ×2 IMPLANT
SUT MNCRL AB 4-0 PS2 18 (SUTURE) ×4 IMPLANT
SUT VICRYL 0 UR6 27IN ABS (SUTURE) ×2 IMPLANT
TOWEL OR 17X26 10 PK STRL BLUE (TOWEL DISPOSABLE) ×2 IMPLANT
TRAY FOLEY MTR SLVR 14FR STAT (SET/KITS/TRAYS/PACK) ×2 IMPLANT
TRAY FOLEY W/METER SILVER 16FR (SET/KITS/TRAYS/PACK) IMPLANT
TRAY LAPAROSCOPIC (CUSTOM PROCEDURE TRAY) ×2 IMPLANT
TROCAR BLADELESS OPT 5 100 (ENDOMECHANICALS) ×2 IMPLANT
TROCAR XCEL BLUNT TIP 100MML (ENDOMECHANICALS) ×2 IMPLANT
TROCAR XCEL NON-BLD 11X100MML (ENDOMECHANICALS) IMPLANT
TUBING INSUF HEATED (TUBING) ×2 IMPLANT

## 2017-07-04 NOTE — ED Notes (Signed)
PT IS ON HER SIDE ASLEEP RESTING WITH EYES CLOSED. RESPIRATIONS EVEN AND UNLABORED

## 2017-07-04 NOTE — Transfer of Care (Signed)
Immediate Anesthesia Transfer of Care Note  Patient: Heather Day  Procedure(s) Performed: APPENDECTOMY LAPAROSCOPIC (N/A )  Patient Location: PACU  Anesthesia Type:General  Level of Consciousness: awake, sedated and responds to stimulation  Airway & Oxygen Therapy: Patient Spontanous Breathing and Patient connected to face mask oxygen  Post-op Assessment: Report given to RN and Post -op Vital signs reviewed and stable  Post vital signs: Reviewed and stable  Last Vitals:  Vitals Value Taken Time  BP 119/66 07/04/2017  3:15 PM  Temp 36.9 C 07/04/2017  3:06 PM  Pulse 63 07/04/2017  3:16 PM  Resp 12 07/04/2017  3:16 PM  SpO2 100 % 07/04/2017  3:16 PM  Vitals shown include unvalidated device data.  Last Pain:  Vitals:   07/04/17 1305  TempSrc:   PainSc: 10-Worst pain ever         Complications: No apparent anesthesia complications

## 2017-07-04 NOTE — ED Notes (Signed)
Attempted IV with U/S, unsuccessful, tol well

## 2017-07-04 NOTE — ED Notes (Signed)
Patient transported to CT 

## 2017-07-04 NOTE — H&P (Addendum)
[facility] Admission Note  Heather Day 01-Jun-1985  [id number].    Requesting MD: Shanon Rosser Chief Complaint/Reason for Consult: appendicitis  HPI:  Heather Day is a 32yo female who was transferred from [facility] to [facility] this morning with acute onset RLQ pain. Patient states that she started having sharp RLQ abdominal pain about 2000 last night. The pain is constant and severe. Worse with palpation. Associated with nausea, vomiting, and diarrhea. Denies fever or chills. States that she has never had pain like this before. Tried Tums but this did not help so she went to the ED. In the ED she had a CT scan that showed distended appendix with mild periappendiceal infiltration likely representing early acute appendicitis, no abscess or appendicolith. WBC 13.8. General surgery asked to see.  Last meal around 2000 last night.  -PMH significant for H/o endocarditis, Nonrheumatic tricuspid valve insufficiency, GERD -Abdominal surgical history: c section -Anticoagulants: none -Former smoker, quit 3 weeks ago -Former IV drug abuse, quit about 10 months ago -Employment: currently unemployed   ROS: Review of Systems  Constitutional: Negative.   HENT: Negative.   Eyes: Negative.   Respiratory: Negative.   Cardiovascular: Negative.   Gastrointestinal: Positive for abdominal pain, diarrhea, nausea and vomiting.  Genitourinary: Negative.   Musculoskeletal: Negative.   Skin: Negative.   Neurological: Negative.    All systems reviewed and otherwise negative except for as above  Family History  Problem Relation Age of Onset  . Hypothyroidism Mother   . Hypotension Mother   . Healthy Father     Past Medical History:  Diagnosis Date  . Drug abuse, IV ([place])   . Heartburn   . Hepatitis C   . Rheumatic tricuspid insufficiency     Past Surgical History:  Procedure Laterality Date  . CESAREAN SECTION      Social History:  reports that she has been smoking cigarettes.  She has  a 20.00 pack-year smoking history. She has never used smokeless tobacco. She reports that she has current or past drug history. Drug: IV. She reports that she does not drink alcohol.  Allergies: No Known Allergies   (Not in a hospital admission)  Prior to Admission medications   Medication Sig Start Date End Date Taking? Authorizing Provider  omeprazole (PRILOSEC) 20 MG capsule Take 1 capsule (20 mg total) by mouth 2 (two) times daily before a meal. 03/16/17  Yes Park Liter, MD    Blood pressure (!) 116/54, pulse 65, temperature 97.6 F (36.4 C), temperature source Oral, resp. rate 17, height 5' 8"  (1.727 m), weight 240 lb (108.9 kg), last menstrual period 07/02/2017, SpO2 95 %. Physical Exam: General: pleasant, WD/WN white female who is laying in bed in NAD HEENT: head is normocephalic, atraumatic.  Sclera are noninjected.  Pupils equal and round.  Ears and nose without any masses or lesions.  Mouth is pink and moist. Dentition fair Heart: regular, rate, and rhythm.  +murmur.  Palpable pedal pulses bilaterally Lungs: CTAB, no wheezes, rhonchi, or rales noted.  Respiratory effort nonlabored Abd: well healed lower transverse incision, soft, ND, +BS, no masses, hernias, or organomegaly. +TTP RLQ with voluntary guarding, no rebound MS: all 4 extremities are symmetrical with no cyanosis, clubbing, or edema. Skin: warm and dry with no masses, lesions, or rashes Psych: A&Ox3 with an appropriate affect. Neuro: cranial nerves grossly intact, extremity CSM intact bilaterally, normal speech  Results for orders placed or performed during the hospital encounter of 07/04/17 (from the past 48 hour(s))  Urinalysis, Routine w reflex microscopic     Status: Abnormal   Collection Time: 07/04/17  1:20 AM  Result Value Ref Range   Color, Urine YELLOW YELLOW   APPearance HAZY (A) CLEAR   Specific Gravity, Urine >1.030 (H) 1.005 - 1.030   pH 5.5 5.0 - 8.0   Glucose, UA NEGATIVE NEGATIVE mg/dL    Hgb urine dipstick NEGATIVE NEGATIVE   Bilirubin Urine NEGATIVE NEGATIVE   Ketones, ur NEGATIVE NEGATIVE mg/dL   Protein, ur NEGATIVE NEGATIVE mg/dL   Nitrite NEGATIVE NEGATIVE   Leukocytes, UA NEGATIVE NEGATIVE    Comment: Microscopic not done on urines with negative protein, blood, leukocytes, nitrite, or glucose < 500 mg/dL. Performed at [facility], [address] [place], [place] [address]   Pregnancy, urine     Status: None   Collection Time: 07/04/17  1:20 AM  Result Value Ref Range   Preg Test, Ur NEGATIVE NEGATIVE    Comment:        THE SENSITIVITY OF THIS METHODOLOGY IS >20 mIU/mL. Performed at [facility], [address] [place], [place] [address]   CBC with Differential     Status: Abnormal   Collection Time: 07/04/17  2:06 AM  Result Value Ref Range   WBC 13.8 (H) 4.0 - 10.5 K/uL   RBC 4.00 3.87 - 5.11 MIL/uL   Hemoglobin 12.3 12.0 - 15.0 g/dL   HCT 35.3 (L) 36.0 - 46.0 %   MCV 88.3 78.0 - 100.0 fL   MCH 30.8 26.0 - 34.0 pg   MCHC 34.8 30.0 - 36.0 g/dL   RDW 12.4 11.5 - 15.5 %   Platelets 233 150 - 400 K/uL   Neutrophils Relative % 88 %   Neutro Abs 12.0 (H) 1.7 - 7.7 K/uL   Lymphocytes Relative 7 %   Lymphs Abs 1.0 0.7 - 4.0 K/uL   Monocytes Relative 5 %   Monocytes Absolute 0.8 0.1 - 1.0 K/uL   Eosinophils Relative 0 %   Eosinophils Absolute 0.0 0.0 - 0.7 K/uL   Basophils Relative 0 %   Basophils Absolute 0.0 0.0 - 0.1 K/uL    Comment: Performed at [facility], [address] [place], [place] [address]  Troponin I     Status: None   Collection Time: 07/04/17  2:06 AM  Result Value Ref Range   Troponin I <0.03 <0.03 ng/mL    Comment: Performed at [facility], [address] [place], [place] [address]  Lipase, blood     Status: None   Collection Time: 07/04/17  2:06 AM  Result Value Ref Range   Lipase 30 11 - 51 U/L    Comment: Performed at [facility], [address] [place], [place]  [address]  Comprehensive metabolic panel     Status: Abnormal   Collection Time: 07/04/17  2:06 AM  Result Value Ref Range   Sodium 133 (L) 135 - 145 mmol/L   Potassium 3.6 3.5 - 5.1 mmol/L   Chloride 106 101 - 111 mmol/L   CO2 21 (L) 22 - 32 mmol/L   Glucose, Bld 144 (H) 65 - 99 mg/dL   BUN 16 6 - 20 mg/dL   Creatinine, Ser 0.66 0.44 - 1.00 mg/dL   Calcium 8.6 (L) 8.9 - 10.3 mg/dL   Total Protein 6.6 6.5 - 8.1 g/dL   Albumin 3.8 3.5 - 5.0 g/dL   AST 21 15 -  41 U/L   ALT 12 (L) 14 - 54 U/L   Alkaline Phosphatase 39 38 - 126 U/L   Total Bilirubin 0.4 0.3 - 1.2 mg/dL   GFR calc non Af Amer >60 >60 mL/min   GFR calc Af Amer >60 >60 mL/min    Comment: (NOTE) The eGFR has been calculated using the CKD EPI equation. This calculation has not been validated in all clinical situations. eGFR's persistently <60 mL/min signify possible Chronic Kidney Disease.    Anion gap 6 5 - 15    Comment: Performed at [facility], [address] [place], [place] [address]   Ct Abdomen Pelvis W Contrast  Result Date: 07/04/2017 CLINICAL DATA:  Right chest and abdominal burning and gas pain for 8 hours. EXAM: CT ABDOMEN AND PELVIS WITH CONTRAST TECHNIQUE: Multidetector CT imaging of the abdomen and pelvis was performed using the standard protocol following bolus administration of intravenous contrast. CONTRAST:  131m ISOVUE-300 IOPAMIDOL (ISOVUE-300) INJECTION 61% COMPARISON:  None. FINDINGS: Lower chest: Mild atelectasis in the lung bases. Hepatobiliary: Cholelithiasis. Common duct stone. No bile duct dilatation. No inflammatory infiltration. No focal liver lesions. Pancreas: Unremarkable. No pancreatic ductal dilatation or surrounding inflammatory changes. Spleen: Normal in size without focal abnormality. Adrenals/Urinary Tract: Adrenal glands are unremarkable. Kidneys are normal, without renal calculi, focal lesion, or hydronephrosis. Bladder is unremarkable. Stomach/Bowel: Stomach, small bowel, and  colon are not abnormally distended. The appendix is prominent, measuring 10 mm in diameter. Mild inflammatory stranding in the periappendiceal fat. Changes likely represent early acute appendicitis. Appendix: Location: Medial to the cecum and anterior to the psoas muscle Diameter: 10 mm Appendicolith: No Mucosal hyper-enhancement: No Extraluminal gas: No Periappendiceal collection: No, mild stranding. Vascular/Lymphatic: No significant vascular findings are present. No enlarged abdominal or pelvic lymph nodes. Reproductive: Uterus and bilateral adnexa are unremarkable. Other: No abdominal wall hernia or abnormality. No abdominopelvic ascites. Musculoskeletal: Spondylolysis with mild spondylolisthesis at L5-S1. Mild degenerative changes. IMPRESSION: 1. Distended appendix with mild periappendiceal infiltration likely representing early acute appendicitis. No abscess or appendicolith. 2. Cholelithiasis without evidence of cholecystitis. Common duct stone without biliary dilatation. 3. Spondylolysis with mild spondylolisthesis at L5-S1. Electronically Signed   By: WLucienne CapersM.D.   On: 07/04/2017 04:21   Anti-infectives (From admission, onward)   Start     Dose/Rate Route Frequency Ordered Stop   07/04/17 0430  cefTRIAXone (ROCEPHIN) 2 g in sodium chloride 0.9 % 100 mL IVPB     2 g 200 mL/hr over 30 Minutes Intravenous  Once 07/04/17 0426 07/04/17 [id number]  07/04/17 0430  metroNIDAZOLE (FLAGYL) IVPB 500 mg     500 mg 100 mL/hr over 60 Minutes Intravenous  Once 07/04/17 0426          Assessment/Plan H/o IV drug abuse - quit about 10 months ago H/o endocarditis Nonrheumatic tricuspid valve insufficiency - followed by Dr. KAgustin Cree working on weight loss prior to valve replacement surgery Former smoker - quit about 3 weeks ago GERD - protonix Cholelithiasis  Early acute appendicitis - CT scan shows distended appendix with mild periappendiceal infiltration likely representing early acute  appendicitis, no abscess or appendicolith - WBC 13.8, afebrile  ID - rocephin/flagyl 4/24>> VTE - SCDs FEN - IVF, NPO Foley - none Follow up - TBD  Plan - Patient with early acute appendicitis. Will plan for laparoscopic appendectomy today. Keep NPO and continue IV antibiotics.  BWellington Hampshire PSpecialists [facility] 07/04/2017, 7:26 AM Pager: [phone]: [phone]-Fri  7:00 am-4:30 pm Sat-Sun 7:00 am-11:30 am  I have seen and spoken to the patient and her husband.

## 2017-07-04 NOTE — Interval H&P Note (Signed)
History and Physical Interval Note:  07/04/2017 1:25 PM  Heather Day  has presented today for surgery, with the diagnosis of appendicitis  The various methods of treatment have been discussed with the patient and family. After consideration of risks, benefits and other options for treatment, the patient has consented to  Procedure(s): APPENDECTOMY LAPAROSCOPIC (N/A) as a surgical intervention .  The patient's history has been reviewed, patient examined, no change in status, stable for surgery.  I have reviewed the patient's chart and labs.  Questions were answered to the patient's satisfaction.     Valarie MerinoMatthew B Denaly Gatling

## 2017-07-04 NOTE — Anesthesia Preprocedure Evaluation (Addendum)
Anesthesia Evaluation  Patient identified by MRN, date of birth, ID band Patient awake    Reviewed: Allergy & Precautions, NPO status , Patient's Chart, lab work & pertinent test results  History of Anesthesia Complications Negative for: history of anesthetic complications  Airway Mallampati: II  TM Distance: >3 FB Neck ROM: Full    Dental  (+) Poor Dentition, Dental Advisory Given   Pulmonary Current Smoker, former smoker,    Pulmonary exam normal        Cardiovascular Normal cardiovascular exam+ Valvular Problems/Murmurs   Study Conclusions  - Left ventricle: The cavity size was normal. Wall thickness was normal. Systolic function was normal. The estimated ejection fraction was in the range of 55% to 60%. Wall motion was normal; there were no regional wall motion abnormalities. Left ventricular diastolic function parameters were normal. - Aortic valve: Valve area (VTI): 3.23 cm^2. Valve area (Vmax): 3.34 cm^2. Valve area (Vmean): 3.47 cm^2. - Right atrium: The atrium was mildly dilated. - Tricuspid valve: There was moderate regurgitation directed eccentrically and toward the septum.     Neuro/Psych negative neurological ROS  negative psych ROS   GI/Hepatic negative GI ROS, (+)     substance abuse  IV drug use, Hepatitis -, C  Endo/Other  Morbid obesity  Renal/GU      Musculoskeletal   Abdominal   Peds  Hematology   Anesthesia Other Findings   Reproductive/Obstetrics                            Anesthesia Physical Anesthesia Plan  ASA: III and emergent  Anesthesia Plan: General   Post-op Pain Management:    Induction: Intravenous, Rapid sequence and Cricoid pressure planned  PONV Risk Score and Plan: 4 or greater and Ondansetron, Dexamethasone, Scopolamine patch - Pre-op and Diphenhydramine  Airway Management Planned: Oral ETT  Additional Equipment:   Intra-op Plan:    Post-operative Plan: Extubation in OR  Informed Consent: I have reviewed the patients History and Physical, chart, labs and discussed the procedure including the risks, benefits and alternatives for the proposed anesthesia with the patient or authorized representative who has indicated his/her understanding and acceptance.   Dental advisory given  Plan Discussed with: CRNA and Anesthesiologist  Anesthesia Plan Comments:        Anesthesia Quick Evaluation

## 2017-07-04 NOTE — ED Notes (Addendum)
Attempted IV x 1 to [facility], unsuccessful. States was a IV drug user for 9 years and has been clean for 10 months . Scar tissue noted to arms

## 2017-07-04 NOTE — Discharge Summary (Signed)
[facility] Discharge Summary   Patient ID: Heather Day MRN: [id number] DOB/AGE: May 09, 1985 32 y.o.  Admit date: 07/04/2017 Discharge date: 07/04/2017  Admitting Diagnosis: Acute appendicitis  Discharge Diagnosis Patient Active Problem List   Diagnosis Date Noted  . Acute appendicitis 07/04/2017  . Abdominal pain 03/16/2017  . Atypical chest pain 02/26/2017  . History of endocarditis 02/26/2017  . Tricuspid regurgitation 02/26/2017  . History of drug abuse 02/26/2017  . Smoking 02/26/2017    Consultants None  Imaging: Ct Abdomen Pelvis W Contrast  Result Date: 07/04/2017 CLINICAL DATA:  Right chest and abdominal burning and gas pain for 8 hours. EXAM: CT ABDOMEN AND PELVIS WITH CONTRAST TECHNIQUE: Multidetector CT imaging of the abdomen and pelvis was performed using the standard protocol following bolus administration of intravenous contrast. CONTRAST:  ISOVUE-300 IOPAMIDOL (ISOVUE-300) INJECTION 61% COMPARISON:  None. FINDINGS: Lower chest: Mild atelectasis in the lung bases. Hepatobiliary: Cholelithiasis. Common duct stone. No bile duct dilatation. No inflammatory infiltration. No focal liver lesions. Pancreas: Unremarkable. No pancreatic ductal dilatation or surrounding inflammatory changes. Spleen: Normal in size without focal abnormality. Adrenals/Urinary Tract: Adrenal glands are unremarkable. Kidneys are normal, without renal calculi, focal lesion, or hydronephrosis. Bladder is unremarkable. Stomach/Bowel: Stomach, small bowel, and colon are not abnormally distended. The appendix is prominent, measuring 10 mm in diameter. Mild inflammatory stranding in the periappendiceal fat. Changes likely represent early acute appendicitis. Appendix: Location: Medial to the cecum and anterior to the psoas muscle Diameter: 10 mm Appendicolith: No Mucosal hyper-enhancement: No Extraluminal gas: No Periappendiceal collection: No, mild stranding. Vascular/Lymphatic: No  significant vascular findings are present. No enlarged abdominal or pelvic lymph nodes. Reproductive: Uterus and bilateral adnexa are unremarkable. Other: No abdominal wall hernia or abnormality. No abdominopelvic ascites. Musculoskeletal: Spondylolysis with mild spondylolisthesis at L5-S1. Mild degenerative changes. IMPRESSION: 1. Distended appendix with mild periappendiceal infiltration likely representing early acute appendicitis. No abscess or appendicolith. 2. Cholelithiasis without evidence of cholecystitis. Common duct stone without biliary dilatation. 3. Spondylolysis with mild spondylolisthesis at L5-S1. Electronically Signed   By: Burman Nieves M.D.   On: 07/04/2017 04:21    Procedures Dr. [name] (07/04/17) - Laparoscopic Appendectomy  Hospital Course:  Heather Day is a 31yo female who presented to [facility] 4/24 with acute onset abdominal pain that localized to her RLQ.  Workup showed acute appendicitis.  Patient was admitted and underwent procedure listed above.  Tolerated procedure well and was transferred to the floor.  Diet was advanced as tolerated.  On POD0, the patient was voiding well, tolerating diet, ambulating well, pain well controlled, vital signs stable, incisions c/d/i and felt stable for discharge home.  Patient will follow up as below and knows to call with questions or concerns.    I have personally reviewed the patients medication history on the [place] controlled substance database.    Allergies as of 07/04/2017   No Known Allergies     Medication List    TAKE these medications   omeprazole 20 MG capsule Commonly known as:  PRILOSEC Take 1 capsule (20 mg total) by mouth 2 (two) times daily before a meal.   ondansetron 4 MG disintegrating tablet Commonly known as:  ZOFRAN-ODT Take 1 tablet (4 mg total) by mouth every 6 (six) hours as needed for nausea.   oxyCODONE 5 MG immediate release tablet Commonly known as:  Oxy IR/ROXICODONE Take 1 tablet (5 mg total) by mouth  every 6 (six) hours as needed for severe pain.  Follow-up Information    [facility] [place].   Specialty:  General Surgery Why:  Your appointment is 05/07 11:45 am. Please arrive 30 minutes prior to your appointment to check in and fill out paperwork. Bring photo ID and insurance information. Contact information: [address] [place] [id number] [phone]          Signed: Franne FortsBrooke A Meuth, [facility] 07/04/2017, 3:54 PM Pager: [phone] Consults: [phone] Mon-Fri 7:00 am-4:30 pm Sat-Sun 7:00 am-11:30 am

## 2017-07-04 NOTE — Anesthesia Procedure Notes (Signed)
Procedure Name: Intubation Date/Time: 07/04/2017 2:00 PM Performed by: Montel Clock, CRNA Pre-anesthesia Checklist: Patient identified, Emergency Drugs available, Suction available, Patient being monitored and Timeout performed Patient Re-evaluated:Patient Re-evaluated prior to induction Oxygen Delivery Method: Circle system utilized Preoxygenation: Pre-oxygenation with 100% oxygen Induction Type: IV induction, Rapid sequence and Cricoid Pressure applied Laryngoscope Size: Mac and 3 Grade View: Grade I Tube type: Oral Tube size: 7.5 mm Number of attempts: 1 Airway Equipment and Method: Stylet Placement Confirmation: ETT inserted through vocal cords under direct vision,  positive ETCO2 and breath sounds checked- equal and bilateral Secured at: 21 cm Tube secured with: Tape Dental Injury: Teeth and Oropharynx as per pre-operative assessment

## 2017-07-04 NOTE — ED Notes (Signed)
[facility], Raford Pitchererry RN

## 2017-07-04 NOTE — Op Note (Addendum)
Heather Day  08/09/1985 July 04, 2017   PCP:  System, Pcp Not In   Surgeon: Wenda LowMatt Joncarlo Friberg, MD, FACS  Asst:  none  Anes:  general  Preop Dx: Acute appendicitis, hx hep C Postop Dx: same  Procedure: Laparoscopic appendectomy Location Surgery: [facility] 4 Complications: nonte  EBL:   minimal cc  Drains: none  Description of Procedure:  The patient was taken to OR 4 .  After anesthesia was administered and the patient was prepped a timeout was performed.  Access to the abdomen was achieved via the umbilicus making a linear cut down in the umbilicus findings small umbilical hernia which we opened up and entered with a Hassan trocar.  This was anchored with a suture.  2 fives were placed one in the right upper quadrant on the left lower quadrant.  The 5 mm camera was placed in the left lower quadrant and with angle 30 degree visualize the right lower quadrant and the appendix.  It was to her it was edematous.  It was easily mobile however.  I was able to transect the mesentery of the appendix isolate the base.  The base was then stapled with a white load 4.5 cm cartridge and the appendix was placed in a bag and brought to the umbilicus.  Survey narrowing looked in unremarkable.  The umbilical port was repaired with 0 Vicryl and this was visualized laparoscopically.  The gallbladder was visualized and was robins egg blue with small Pharygian cap.   Ports were injected with Exparel and the abdomen was deflated 4-0 Vicryl was used 4-0 Monocryl was used on the skin along with Dermabond.  The patient tolerated procedure well and was taken to the recovery room in satisfactory condition.  The patient tolerated the procedure well and was taken to the PACU in stable condition.     Heather B. Daphine DeutscherMartin, MD, [facility], [place] [phone]

## 2017-07-04 NOTE — Anesthesia Postprocedure Evaluation (Signed)
Anesthesia Post Note  Patient: Heather Day  Procedure(s) Performed: APPENDECTOMY LAPAROSCOPIC (N/A )     Patient location during evaluation: PACU Anesthesia Type: General Level of consciousness: awake Pain management: pain level controlled Vital Signs Assessment: post-procedure vital signs reviewed and stable Respiratory status: spontaneous breathing Cardiovascular status: stable Anesthetic complications: no    Last Vitals:  Vitals:   07/04/17 1050 07/04/17 1109  BP: 98/63 91/61  Pulse: 67 62  Resp: 18 18  Temp: 36.7 C 37.1 C  SpO2: 98% 99%    Last Pain:  Vitals:   07/04/17 1305  TempSrc:   PainSc: 10-Worst pain ever                 Heather Day

## 2017-07-04 NOTE — ED Provider Notes (Signed)
[facility] Provider Note: Lowella DellJ. Lane Ayana Imhof, MD, FACEP  CSN: [id number] MRN: [id number] ARRIVAL: 07/03/17 at 2326 ROOM: MH09/MH09   CHIEF COMPLAINT  Abdominal Pain   HISTORY OF PRESENT ILLNESS  07/04/17 2:25 AM Heather Day is a 32 y.o. female with a history of rheumatic tricuspid insufficiency and frequent heartburn for which she takes Prilosec and Tums.  She is here with right lower quadrant abdominal pain that began about 8:15 PM yesterday evening.  This pain is sharp, constant and well localized.  It is moderate to severe.  It is worse with palpation or movement.  She has had associated nausea and vomiting and later some diarrhea.  She has not had a fever, dysuria, hematuria, vaginal bleeding or vaginal discharge.  She has had heartburn which was not relieved with Tums.   Past Medical History:  Diagnosis Date  . Drug abuse, IV (HCC)   . Heartburn   . Hepatitis C   . Rheumatic tricuspid insufficiency     Past Surgical History:  Procedure Laterality Date  . CESAREAN SECTION      Family History  Problem Relation Age of Onset  . Hypothyroidism Mother   . Hypotension Mother   . Healthy Father     Social History   Tobacco Use  . Smoking status: Current Every Day Smoker    Packs/day: 1.00    Years: 20.00    Pack years: 20.00    Types: Cigarettes  . Smokeless tobacco: Never Used  Substance Use Topics  . Alcohol use: No  . 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 Authorizing Provider  omeprazole (PRILOSEC) 20 MG capsule Take 1 capsule (20 mg total) by mouth 2 (two) times daily before a meal. 03/16/17   Georgeanna LeaKrasowski, Robert J, MD    Allergies Patient has no known allergies.   REVIEW OF SYSTEMS  Negative except as noted here or in the History of Present Illness.   PHYSICAL EXAMINATION  Initial Vital Signs Blood  pressure 106/69, pulse 66, temperature 97.6 F (36.4 C), temperature source Oral, resp. rate 18, height 5\' 8"  (1.727 m), weight 108.9 kg (240 lb), last menstrual period 07/02/2017, SpO2 100 %.  Examination General: Well-developed, well-nourished female in no acute distress; appearance consistent with age of record HENT: normocephalic; atraumatic Eyes: pupils equal, round and reactive to light; extraocular muscles intact Neck: supple Heart: regular rate and rhythm Lungs: clear to auscultation bilaterally Abdomen: soft; nondistended; right lower quadrant tenderness; bowel sounds present Extremities: No deformity; full range of motion; pulses normal Neurologic: Awake, alert and oriented; motor function intact in all extremities and symmetric; no facial droop Skin: Warm and dry Psychiatric: Normal mood and affect   RESULTS  Summary of this visit's results, reviewed by myself:  EKG Interpretation:  Date & Time: 07/03/2017 11:35 PM  Rate: 76  Rhythm: normal sinus rhythm  QRS Axis: normal  Intervals: normal  ST/T Wave abnormalities: nonspecific T wave changes  Conduction Disutrbances:none  Narrative Interpretation:   Old EKG Reviewed: none available   Laboratory Studies: Results for orders placed or performed during the hospital encounter of 07/04/17 (from the past 24 hour(s))  Urinalysis, Routine w reflex microscopic     Status: Abnormal   Collection Time: 07/04/17  1:20 AM  Result Value Ref Range   Color,  Urine YELLOW YELLOW   APPearance HAZY (A) CLEAR   Specific Gravity, Urine >1.030 (H) 1.005 - 1.030   pH 5.5 5.0 - 8.0   Glucose, UA NEGATIVE NEGATIVE mg/dL   Hgb urine dipstick NEGATIVE NEGATIVE   Bilirubin Urine NEGATIVE NEGATIVE   Ketones, ur NEGATIVE NEGATIVE mg/dL   Protein, ur NEGATIVE NEGATIVE mg/dL   Nitrite NEGATIVE NEGATIVE   Leukocytes, UA NEGATIVE NEGATIVE  Pregnancy, urine     Status: None   Collection Time: 07/04/17  1:20 AM  Result Value Ref Range   Preg  Test, Ur NEGATIVE NEGATIVE  CBC with Differential     Status: Abnormal   Collection Time: 07/04/17  2:06 AM  Result Value Ref Range   WBC 13.8 (H) 4.0 - 10.5 K/uL   RBC 4.00 3.87 - 5.11 MIL/uL   Hemoglobin 12.3 12.0 - 15.0 g/dL   HCT 78.2 (L) 95.6 - 21.3 %   MCV 88.3 78.0 - 100.0 fL   MCH 30.8 26.0 - 34.0 pg   MCHC 34.8 30.0 - 36.0 g/dL   RDW 08.6 57.8 - 46.9 %   Platelets 233 150 - 400 K/uL   Neutrophils Relative % 88 %   Neutro Abs 12.0 (H) 1.7 - 7.7 K/uL   Lymphocytes Relative 7 %   Lymphs Abs 1.0 0.7 - 4.0 K/uL   Monocytes Relative 5 %   Monocytes Absolute 0.8 0.1 - 1.0 K/uL   Eosinophils Relative 0 %   Eosinophils Absolute 0.0 0.0 - 0.7 K/uL   Basophils Relative 0 %   Basophils Absolute 0.0 0.0 - 0.1 K/uL  Troponin I     Status: None   Collection Time: 07/04/17  2:06 AM  Result Value Ref Range   Troponin I <0.03 <0.03 ng/mL  Lipase, blood     Status: None   Collection Time: 07/04/17  2:06 AM  Result Value Ref Range   Lipase 30 11 - 51 U/L  Comprehensive metabolic panel     Status: Abnormal   Collection Time: 07/04/17  2:06 AM  Result Value Ref Range   Sodium 133 (L) 135 - 145 mmol/L   Potassium 3.6 3.5 - 5.1 mmol/L   Chloride 106 101 - 111 mmol/L   CO2 21 (L) 22 - 32 mmol/L   Glucose, Bld 144 (H) 65 - 99 mg/dL   BUN 16 6 - 20 mg/dL   Creatinine, Ser 6.29 0.44 - 1.00 mg/dL   Calcium 8.6 (L) 8.9 - 10.3 mg/dL   Total Protein 6.6 6.5 - 8.1 g/dL   Albumin 3.8 3.5 - 5.0 g/dL   AST 21 15 - 41 U/L   ALT 12 (L) 14 - 54 U/L   Alkaline Phosphatase 39 38 - 126 U/L   Total Bilirubin 0.4 0.3 - 1.2 mg/dL   GFR calc non Af Amer >60 >60 mL/min   GFR calc Af Amer >60 >60 mL/min   Anion gap 6 5 - 15   Imaging Studies: Ct Abdomen Pelvis W Contrast  Result Date: 07/04/2017 CLINICAL DATA:  Right chest and abdominal burning and gas pain for 8 hours. EXAM: CT ABDOMEN AND PELVIS WITH CONTRAST TECHNIQUE: Multidetector CT imaging of the abdomen and pelvis was performed using the  standard protocol following bolus administration of intravenous contrast. CONTRAST:  ISOVUE-300 IOPAMIDOL (ISOVUE-300) INJECTION 61% COMPARISON:  None. FINDINGS: Lower chest: Mild atelectasis in the lung bases. Hepatobiliary: Cholelithiasis. Common duct stone. No bile duct dilatation. No inflammatory infiltration. No focal liver lesions. Pancreas: Unremarkable.  No pancreatic ductal dilatation or surrounding inflammatory changes. Spleen: Normal in size without focal abnormality. Adrenals/Urinary Tract: Adrenal glands are unremarkable. Kidneys are normal, without renal calculi, focal lesion, or hydronephrosis. Bladder is unremarkable. Stomach/Bowel: Stomach, small bowel, and colon are not abnormally distended. The appendix is prominent, measuring 10 mm in diameter. Mild inflammatory stranding in the periappendiceal fat. Changes likely represent early acute appendicitis. Appendix: Location: Medial to the cecum and anterior to the psoas muscle Diameter: 10 mm Appendicolith: No Mucosal hyper-enhancement: No Extraluminal gas: No Periappendiceal collection: No, mild stranding. Vascular/Lymphatic: No significant vascular findings are present. No enlarged abdominal or pelvic lymph nodes. Reproductive: Uterus and bilateral adnexa are unremarkable. Other: No abdominal wall hernia or abnormality. No abdominopelvic ascites. Musculoskeletal: Spondylolysis with mild spondylolisthesis at L5-S1. Mild degenerative changes. IMPRESSION: 1. Distended appendix with mild periappendiceal infiltration likely representing early acute appendicitis. No abscess or appendicolith. 2. Cholelithiasis without evidence of cholecystitis. Common duct stone without biliary dilatation. 3. Spondylolysis with mild spondylolisthesis at L5-S1. Electronically Signed   By: Burman Nieves M.D.   On: 07/04/2017 04:21    ED COURSE and MDM  Nursing notes and initial vitals signs, including pulse oximetry, reviewed.  Vitals:   07/03/17 2332 07/03/17  2333 07/04/17 0404  BP: 106/69  101/62  Pulse: 66  73  Resp: 18  18  Temp: 97.6 F (36.4 C)    TempSrc: Oral    SpO2: 100%  99%  Weight:  108.9 kg (240 lb)   Height:  5\' 8"  (1.727 m)    4:27 AM Antibiotics ordered for likely acute appendicitis.   4:32 AM Dr. Ezzard Standing to see patient in the [facility], ED.  Dr. Rhunette Croft is the accepting EDP.  PROCEDURES    ED DIAGNOSES     ICD-10-CM   1. Acute appendicitis without peritonitis K35.80   2. Calculus of gallbladder without cholecystitis without obstruction K80.20        Tamel Abel, MD 07/04/17 [id number]

## 2017-07-04 NOTE — Discharge Instructions (Signed)

## 2017-07-04 NOTE — Anesthesia Postprocedure Evaluation (Signed)
Anesthesia Post Note  Patient: Heather Day  Procedure(s) Performed: APPENDECTOMY LAPAROSCOPIC (N/A )     Patient location during evaluation: PACU Anesthesia Type: General Level of consciousness: awake Pain management: pain level controlled Vital Signs Assessment: post-procedure vital signs reviewed and stable Cardiovascular status: stable Anesthetic complications: no    Last Vitals:  Vitals:   07/04/17 1545 07/04/17 1600  BP: 113/65 107/60  Pulse: 62 64  Resp: 11 13  Temp: 36.6 C   SpO2: 100% 96%    Last Pain:  Vitals:   07/04/17 1545  TempSrc:   PainSc: 8                  Kaylyne Axton Frankland

## 2017-07-04 NOTE — Progress Notes (Signed)
Patient has walked, urinated, and ate with no nausea. Patient wishes to go home. Discharge instructions were given to patient and she was taken to main entrance.

## 2017-07-05 ENCOUNTER — Encounter (HOSPITAL_COMMUNITY): Payer: Self-pay | Admitting: Surgery

## 2017-07-05 LAB — HIV ANTIBODY (ROUTINE TESTING W REFLEX): HIV Screen 4th Generation wRfx: NONREACTIVE

## 2017-07-31 ENCOUNTER — Emergency Department (HOSPITAL_BASED_OUTPATIENT_CLINIC_OR_DEPARTMENT_OTHER): Payer: Self-pay

## 2017-07-31 ENCOUNTER — Encounter (HOSPITAL_BASED_OUTPATIENT_CLINIC_OR_DEPARTMENT_OTHER): Payer: Self-pay | Admitting: *Deleted

## 2017-07-31 ENCOUNTER — Emergency Department (HOSPITAL_BASED_OUTPATIENT_CLINIC_OR_DEPARTMENT_OTHER)
Admission: EM | Admit: 2017-07-31 | Discharge: 2017-07-31 | Disposition: A | Discharge status: Home / Self Care | Payer: Self-pay | Attending: Emergency Medicine | Admitting: Emergency Medicine

## 2017-07-31 ENCOUNTER — Other Ambulatory Visit: Payer: Self-pay

## 2017-07-31 DIAGNOSIS — F17210 Nicotine dependence, cigarettes, uncomplicated: Secondary | ICD-10-CM | POA: Insufficient documentation

## 2017-07-31 DIAGNOSIS — R10811 Right upper quadrant abdominal tenderness: Secondary | ICD-10-CM

## 2017-07-31 DIAGNOSIS — Z79899 Other long term (current) drug therapy: Secondary | ICD-10-CM | POA: Insufficient documentation

## 2017-07-31 DIAGNOSIS — Z8719 Personal history of other diseases of the digestive system: Secondary | ICD-10-CM

## 2017-07-31 DIAGNOSIS — K8050 Calculus of bile duct without cholangitis or cholecystitis without obstruction: Secondary | ICD-10-CM | POA: Insufficient documentation

## 2017-07-31 LAB — COMPREHENSIVE METABOLIC PANEL
ALK PHOS: 44 U/L (ref 38–126)
ALT: 13 U/L — ABNORMAL LOW (ref 14–54)
AST: 20 U/L (ref 15–41)
Albumin: 3.8 g/dL (ref 3.5–5.0)
Anion gap: 8 (ref 5–15)
BILIRUBIN TOTAL: 0.2 mg/dL — AB (ref 0.3–1.2)
BUN: 15 mg/dL (ref 6–20)
CALCIUM: 9.1 mg/dL (ref 8.9–10.3)
CO2: 21 mmol/L — ABNORMAL LOW (ref 22–32)
CREATININE: 0.65 mg/dL (ref 0.44–1.00)
Chloride: 108 mmol/L (ref 101–111)
Glucose, Bld: 76 mg/dL (ref 65–99)
Potassium: 3.8 mmol/L (ref 3.5–5.1)
Sodium: 137 mmol/L (ref 135–145)
Total Protein: 6.9 g/dL (ref 6.5–8.1)

## 2017-07-31 LAB — CBC
HEMATOCRIT: 37 % (ref 36.0–46.0)
HEMOGLOBIN: 13.1 g/dL (ref 12.0–15.0)
MCH: 31.3 pg (ref 26.0–34.0)
MCHC: 35.4 g/dL (ref 30.0–36.0)
MCV: 88.3 fL (ref 78.0–100.0)
Platelets: 265 10*3/uL (ref 150–400)
RBC: 4.19 MIL/uL (ref 3.87–5.11)
RDW: 12.6 % (ref 11.5–15.5)
WBC: 6.5 10*3/uL (ref 4.0–10.5)

## 2017-07-31 LAB — URINALYSIS, ROUTINE W REFLEX MICROSCOPIC
Bilirubin Urine: NEGATIVE
Glucose, UA: NEGATIVE mg/dL
KETONES UR: NEGATIVE mg/dL
Leukocytes, UA: NEGATIVE
NITRITE: NEGATIVE
PROTEIN: NEGATIVE mg/dL
pH: 5.5 (ref 5.0–8.0)

## 2017-07-31 LAB — PREGNANCY, URINE: PREG TEST UR: NEGATIVE

## 2017-07-31 LAB — URINALYSIS, MICROSCOPIC (REFLEX)

## 2017-07-31 LAB — C DIFFICILE QUICK SCREEN W PCR REFLEX
C DIFFICLE (CDIFF) ANTIGEN: NEGATIVE
C Diff interpretation: NOT DETECTED
C Diff toxin: NEGATIVE

## 2017-07-31 LAB — LIPASE, BLOOD: LIPASE: 60 U/L — AB (ref 11–51)

## 2017-07-31 MED ORDER — SODIUM CHLORIDE 0.9 % IV BOLUS
1000.0000 mL | Freq: Once | INTRAVENOUS | Status: AC
  Administered 2017-07-31: 1000 mL via INTRAVENOUS

## 2017-07-31 MED ORDER — LIDOCAINE HCL (PF) 1 % IJ SOLN
2.0000 mL | Freq: Once | INTRAMUSCULAR | Status: AC
  Administered 2017-07-31: 2 mL via INTRADERMAL
  Filled 2017-07-31: qty 5

## 2017-07-31 MED ORDER — METOCLOPRAMIDE HCL 10 MG PO TABS: 10.0000 mg | ORAL_TABLET | Freq: Four times a day (QID) | ORAL | 0 refills | Status: DC | PRN

## 2017-07-31 MED ORDER — METOCLOPRAMIDE HCL 5 MG/ML IJ SOLN
5.0000 mg | Freq: Once | INTRAMUSCULAR | Status: AC
  Administered 2017-07-31: 5 mg via INTRAVENOUS
  Filled 2017-07-31: qty 2

## 2017-07-31 MED ORDER — CIPROFLOXACIN HCL 500 MG PO TABS: 500.0000 mg | ORAL_TABLET | Freq: Two times a day (BID) | ORAL | 0 refills | Status: DC

## 2017-07-31 MED ORDER — MORPHINE SULFATE (PF) 4 MG/ML IV SOLN
4.0000 mg | Freq: Once | INTRAVENOUS | Status: AC
  Administered 2017-07-31: 4 mg via INTRAVENOUS
  Filled 2017-07-31: qty 1

## 2017-07-31 MED FILL — CIPROFLOXACIN HCL 500 MG TA: 500 | 3 days supply | Qty: 6 | Fill #0

## 2017-07-31 MED FILL — METOCLOPRAMIDE 10 MG TABLET: 10 | 3 days supply | Qty: 12 | Fill #0

## 2017-07-31 NOTE — ED Provider Notes (Addendum)
[facility] EMERGENCY DEPARTMENT Provider Note   CSN: [id number] Arrival date & time: 07/31/17  1040     History   Chief Complaint Chief Complaint  Patient presents with  . Abdominal Pain    HPI Heather Day is a 32 y.o. female.complains of diarrhea for approximately the past 2 weeks for 5 episodes per day Also complains of right upper quadrant pain for approximately the past 2 weeks. Nonradiating, worse with eating. No fever. Other associated symptoms include nausea no vomiting she treated herself with Mucinex , without relief.no chest pain. No other associated symptoms. Abdominal discomfort is mild at present.other associated symptoms include generalized weakness and fatigue. Feels dehydrated  HPI  Past Medical History:  Diagnosis Date  . Drug abuse, IV (HCC)   . Heartburn   . Hepatitis C   . Rheumatic tricuspid insufficiency     Patient Active Problem List   Diagnosis Date Noted  . Acute appendicitis 07/04/2017  . Abdominal pain 03/16/2017  . Atypical chest pain 02/26/2017  . History of endocarditis 02/26/2017  . Tricuspid regurgitation 02/26/2017  . History of drug abuse 02/26/2017  . Smoking 02/26/2017    Past Surgical History:  Procedure Laterality Date  . CESAREAN SECTION    . LAPAROSCOPIC APPENDECTOMY N/A 07/04/2017   Procedure: APPENDECTOMY LAPAROSCOPIC;  Surgeon: Luretha Murphy, MD;  Location: [facility];  Service: General;  Laterality: N/A;     OB History   None      Home Medications    Prior to Admission medications   Medication Sig Start Date End Date Taking? Authorizing Provider  omeprazole (PRILOSEC) 20 MG capsule Take 1 capsule (20 mg total) by mouth 2 (two) times daily before a meal. 03/16/17   [name], MD  ondansetron (ZOFRAN-ODT) 4 MG disintegrating tablet Take 1 tablet (4 mg total) by mouth every 6 (six) hours as needed for nausea. 07/04/17   Meuth, Brooke A, PA-C  oxyCODONE (OXY IR/ROXICODONE) 5 MG immediate release tablet  Take 1 tablet (5 mg total) by mouth every 6 (six) hours as needed for severe pain. 07/04/17   Meuth, Lina Sar, PA-C    Family History Family History  Problem Relation Age of Onset  . Hypothyroidism Mother   . Hypotension Mother   . Healthy Father     Social History Social History   Tobacco Use  . Smoking status: Current Every Day Smoker    Packs/day: 1.00    Years: 20.00    Pack years: 20.00    Types: Cigarettes  . Smokeless tobacco: Never Used  Substance Use Topics  . Alcohol use: No  . 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   Review of Systems Review of Systems  Constitutional: Positive for fatigue.  HENT: Negative.   Respiratory: Negative.   Cardiovascular: Negative.   Gastrointestinal: Positive for abdominal pain, diarrhea and nausea.  Musculoskeletal: Negative.   Skin: Negative.   Neurological: Positive for weakness.  Psychiatric/Behavioral: Negative.   All other systems reviewed and are negative.    Physical Exam Updated Vital Signs BP 125/78 (BP Location: Right Arm)   Pulse 82   Temp 98.2 F (36.8 C) (Oral)   Resp 18   Ht  (1.727 m)   Wt 122.5 kg (270 lb)   LMP 07/29/2017 Comment: (-) urine pregnancy//a.c.  SpO2  99%   BMI 41.05 kg/m   Physical Exam  Constitutional: She appears well-developed and well-nourished. No distress.  HENT:  Head: Normocephalic and atraumatic.  Eyes: Pupils are equal, round, and reactive to light. Conjunctivae are normal.  Neck: Neck supple. No tracheal deviation present. No thyromegaly present.  Cardiovascular: Normal rate and regular rhythm.  No murmur heard. Pulmonary/Chest: Effort normal and breath sounds normal.  Abdominal: Soft. Bowel sounds are normal. She exhibits no distension. There is tenderness. There is no guarding.  Obese, minimally tender at right upper quadrant  Musculoskeletal: Normal  range of motion. She exhibits no edema or tenderness.  Neurological: She is alert. Coordination normal.  Skin: Skin is warm and dry. No rash noted.  Psychiatric: She has a normal mood and affect.  Nursing note and vitals reviewed.    ED Treatments / Results  Labs (all labs ordered are listed, but only abnormal results are displayed) Labs Reviewed  URINALYSIS, ROUTINE W REFLEX MICROSCOPIC - Abnormal; Notable for the following components:      Result Value   Specific Gravity, Urine >1.030 (*)    Hgb urine dipstick TRACE (*)    All other components within normal limits  URINALYSIS, MICROSCOPIC (REFLEX) - Abnormal; Notable for the following components:   Bacteria, UA FEW (*)    All other components within normal limits  C DIFFICILE QUICK SCREEN W PCR REFLEX  CBC  PREGNANCY, URINE  COMPREHENSIVE METABOLIC PANEL  LIPASE, BLOOD    EKG None Results for orders placed or performed during the hospital encounter of 07/31/17  CBC  Result Value Ref Range   WBC 6.5 4.0 - 10.5 K/uL   RBC 4.19 3.87 - 5.11 MIL/uL   Hemoglobin 13.1 12.0 - 15.0 g/dL   HCT 16.1 09.6 - 04.5 %   MCV 88.3 78.0 - 100.0 fL   MCH 31.3 26.0 - 34.0 pg   MCHC 35.4 30.0 - 36.0 g/dL   RDW 40.9 81.1 - 91.4 %   Platelets 265 150 - 400 K/uL  Urinalysis, Routine w reflex microscopic  Result Value Ref Range   Color, Urine YELLOW YELLOW   APPearance CLEAR CLEAR   Specific Gravity, Urine >1.030 (H) 1.005 - 1.030   pH 5.5 5.0 - 8.0   Glucose, UA NEGATIVE NEGATIVE mg/dL   Hgb urine dipstick TRACE (A) NEGATIVE   Bilirubin Urine NEGATIVE NEGATIVE   Ketones, ur NEGATIVE NEGATIVE mg/dL   Protein, ur NEGATIVE NEGATIVE mg/dL   Nitrite NEGATIVE NEGATIVE   Leukocytes, UA NEGATIVE NEGATIVE  Pregnancy, urine  Result Value Ref Range   Preg Test, Ur NEGATIVE NEGATIVE  Urinalysis, Microscopic (reflex)  Result Value Ref Range   RBC / HPF 0-5 0 - 5 RBC/hpf   WBC, UA 0-5 0 - 5 WBC/hpf   Bacteria, UA FEW (A) NONE SEEN    Squamous Epithelial / LPF 0-5 0 - 5  Comprehensive metabolic panel  Result Value Ref Range   Sodium 137 135 - 145 mmol/L   Potassium 3.8 3.5 - 5.1 mmol/L   Chloride 108 101 - 111 mmol/L   CO2 21 (L) 22 - 32 mmol/L   Glucose, Bld 76 65 - 99 mg/dL   BUN 15 6 - 20 mg/dL   Creatinine, Ser 7.82 0.44 - 1.00 mg/dL   Calcium 9.1 8.9 - 95.6 mg/dL   Total Protein 6.9 6.5 - 8.1 g/dL   Albumin 3.8 3.5 - 5.0 g/dL   AST 20 15 - 41 U/L   ALT 13 (L)  14 - 54 U/L   Alkaline Phosphatase 44 38 - 126 U/L   Total Bilirubin 0.2 (L) 0.3 - 1.2 mg/dL   GFR calc non Af Amer >60 >60 mL/min   GFR calc Af Amer >60 >60 mL/min   Anion gap 8 5 - 15  Lipase, blood  Result Value Ref Range   Lipase 60 (H) 11 - 51 U/L   Ct Abdomen Pelvis W Contrast  Result Date: 07/04/2017 CLINICAL DATA:  Right chest and abdominal burning and gas pain for 8 hours. EXAM: CT ABDOMEN AND PELVIS WITH CONTRAST TECHNIQUE: Multidetector CT imaging of the abdomen and pelvis was performed using the standard protocol following bolus administration of intravenous contrast. CONTRAST:  ISOVUE-300 IOPAMIDOL (ISOVUE-300) INJECTION 61% COMPARISON:  None. FINDINGS: Lower chest: Mild atelectasis in the lung bases. Hepatobiliary: Cholelithiasis. Common duct stone. No bile duct dilatation. No inflammatory infiltration. No focal liver lesions. Pancreas: Unremarkable. No pancreatic ductal dilatation or surrounding inflammatory changes. Spleen: Normal in size without focal abnormality. Adrenals/Urinary Tract: Adrenal glands are unremarkable. Kidneys are normal, without renal calculi, focal lesion, or hydronephrosis. Bladder is unremarkable. Stomach/Bowel: Stomach, small bowel, and colon are not abnormally distended. The appendix is prominent, measuring 10 mm in diameter. Mild inflammatory stranding in the periappendiceal fat. Changes likely represent early acute appendicitis. Appendix: Location: Medial to the cecum and anterior to the psoas muscle Diameter: 10  mm Appendicolith: No Mucosal hyper-enhancement: No Extraluminal gas: No Periappendiceal collection: No, mild stranding. Vascular/Lymphatic: No significant vascular findings are present. No enlarged abdominal or pelvic lymph nodes. Reproductive: Uterus and bilateral adnexa are unremarkable. Other: No abdominal wall hernia or abnormality. No abdominopelvic ascites. Musculoskeletal: Spondylolysis with mild spondylolisthesis at L5-S1. Mild degenerative changes. IMPRESSION: 1. Distended appendix with mild periappendiceal infiltration likely representing early acute appendicitis. No abscess or appendicolith. 2. Cholelithiasis without evidence of cholecystitis. Common duct stone without biliary dilatation. 3. Spondylolysis with mild spondylolisthesis at L5-S1. Electronically Signed   By: Burman Nieves M.D.   On: 07/04/2017 04:21   US Abdomen Limited Ruq  Result Date: 07/31/2017 CLINICAL DATA:  Right upper quadrant tenderness EXAM: ULTRASOUND ABDOMEN LIMITED RIGHT UPPER QUADRANT COMPARISON:  CT abdomen and pelvis July 04, 2017 FINDINGS: Gallbladder: Gallbladder is contracted and filled with calculi. Largest individual calculus measures 1.5 cm in length. Gallbladder wall does appear somewhat thickened. There is no appreciable gallbladder wall edema or pericholecystic fluid no sonographic Murphy sign noted by sonographer. Common bile duct: Diameter: 4 mm. No intrahepatic or extrahepatic biliary duct dilatation. Liver: There is an echogenic focus in the left lobe of the liver measuring 0.7 x 0.8 x 0.8 cm. Within normal limits in parenchymal echogenicity. Portal vein is patent on color Doppler imaging with normal direction of blood flow towards the liver. IMPRESSION: 1. Cholelithiasis with contracted gallbladder. Gallbladder wall appears somewhat thickened. A degree of cholecystitis is of concern given this appearance. This finding may warrant nuclear medicine hepatobiliary imaging study to assess for cystic duct  patency. 2. There is a 0.7 x 0.8 x 0.8 cm echogenic focus in the left lobe of the liver, a probable small hemangioma. This presumed small hemangioma was not appreciable on recent CT examination. Given this circumstance, a follow-up ultrasound of the liver in 1 year to confirm stability would be advisable. Electronically Signed   By: Bretta Bang III M.D.   On: 07/31/2017 15:04   Radiology No results found.  Procedures Procedures (including critical care time)  Medications Ordered in ED Medications  sodium chloride 0.9 %  bolus 1,000 mL (has no administration in time range)  metoCLOPramide (REGLAN) injection 5 mg (has no administration in time range)     Initial Impression / Assessment and Plan / ED Course  I have reviewed the triage vital signs and the nursing notes.  Pertinent labs & imaging results that were available during my care of the patient were reviewed by me and considered in my medical decision making (see chart for details).     Nursing unable to establish IV access Angiocath insertion Performed by: Doug Sou  Consent: Verbal consent obtained. Risks and benefits: risks, benefits and alternatives were discussed Time out: Immediately prior to procedure a "time out" was called to verify the correct patient, procedure, equipment, support staff and site/side marked as required.  Preparation: Patient was prepped and draped in the usual sterile fashion.  Vein Location: right external jugular    Gauge: 20  Normal blood return and flush without difficulty Patient tolerance: Patient tolerated the procedure well with no immediate complications.   2:30 PM patient requesting pain medicine. IV morphine ordered after discussion with patient that she's had problems with opiates in the past. She is agreeable tosmall dose IV morphine.  4:15 PM pain control after treatment with intravenous morphine. Nausea is controlled after treatment with IV Reglan. She is able  toDrink water without nausea or vomiting. She feels ready for discharge after treat normal saline . Case discussed with Dr. Maisie Fus, surgeon. Patient is at high risk forinfection and complication. surgery being 1 month postopshe clinically does not have surgical abdomen. No fever no leukocytosis.    Plan C. Difficile panel pending. Prescription Cipro, Reglan. Imodium for diarrhea. Avoid dairy. Low fat diet.Encourage oral hydration.Call Dr. Hannah Beat schedule follow-up point. Return precautions given fever, intractable vomiting, intractable pain   Final Clinical Impressions(s) / ED Diagnoses  Diagnosis #1 biliary colic #2 diarrhea Final diagnoses:  None    ED Discharge Orders    None       Doug Sou, MD 07/31/17 1621    Doug Sou, MD 07/31/17 [phone]

## 2017-07-31 NOTE — Discharge Instructions (Signed)
Take Advil or Motrin as directed for pain .Return to the emergency department if pain is not well controlled with Advil or Motrin taken as directed, if you develop fever higher than 100.4 or you vomit after taking the medication prescribed for nausea. Call Dr. Ermalene Searing office tomorrow to schedule appointment for within the next 2 weeks. Avoid greasy fried or fatty foods. Avoid milk or foods containing milk such as cheese or ice cream all having diarrhea You can take Imodium as directed for diarrhea.Make sure that you drink at least six 8 ounce glasses of water or Gatorade each day in order to stay well-hydrated.

## 2017-07-31 NOTE — ED Notes (Signed)
IV attempt x2 unsuccessful with ultrasound, blood obtained for labs, unable to thread vein.  RN aware.

## 2017-07-31 NOTE — ED Triage Notes (Signed)
Pt reports having appendectomy a month ago, was told at that time that she had gallstones. She has had ruq pain since then with diarrhea. Denies any fevers, n/v.

## 2017-09-03 ENCOUNTER — Telehealth: Payer: Self-pay

## 2017-09-03 NOTE — Telephone Encounter (Signed)
Patient is having swelling in legs and feet. Ankles and feet are the worst. Right ankle is the largest. Swelling in hands as well. Patient has cut back on salt intake. Patient has gained about 7 pounds in 1 week. Patient does not have any shortness of breath that is more than normal. No chest pain for the most part. The other day she felt her heart skip, "but nothing major."   Patient had an echocardiogram about 4 months ago. At that time Dr. Bing MatterKrasowski told her he would like for her to have a repeat echocardiogram in 4 months.

## 2017-09-03 NOTE — Telephone Encounter (Signed)
Patient denies chest pain but does state that she is having some shortness of breath. Patient states she "doesn't think she needs to go to the emergency room but would like to see Dr. Bing MatterKrasowski as soon as possible." Patient scheduled for appointment tomorrow at 10:20 with Dr. Bing MatterKrasowski. Advised patient if she has increased shortness of breath before appointment, she needs to go to the emergency room to be evaluated. Patient verbalized understanding. No further questions.

## 2017-09-03 NOTE — Telephone Encounter (Signed)
Called back again because no one has returned her call, her breathing is getting more labored and swelling is getting worse

## 2017-09-03 NOTE — Telephone Encounter (Signed)
Left message to return call 

## 2017-09-04 ENCOUNTER — Encounter: Payer: Self-pay | Admitting: Cardiology

## 2017-09-04 ENCOUNTER — Ambulatory Visit (INDEPENDENT_AMBULATORY_CARE_PROVIDER_SITE_OTHER): Payer: Self-pay | Admitting: Cardiology

## 2017-09-04 VITALS — BP 110/68 | HR 79 | Ht 68.0 in | Wt 296.8 lb

## 2017-09-04 DIAGNOSIS — R002 Palpitations: Secondary | ICD-10-CM | POA: Insufficient documentation

## 2017-09-04 DIAGNOSIS — Z09 Encounter for follow-up examination after completed treatment for conditions other than malignant neoplasm: Secondary | ICD-10-CM

## 2017-09-04 DIAGNOSIS — M7989 Other specified soft tissue disorders: Secondary | ICD-10-CM

## 2017-09-04 DIAGNOSIS — M25471 Effusion, right ankle: Secondary | ICD-10-CM

## 2017-09-04 DIAGNOSIS — Z8679 Personal history of other diseases of the circulatory system: Secondary | ICD-10-CM

## 2017-09-04 DIAGNOSIS — R42 Dizziness and giddiness: Secondary | ICD-10-CM

## 2017-09-04 NOTE — H&P (View-Only) (Signed)
Cardiology Office Note:    Date:  09/04/2017   ID:  Heather Day, DOB 1985-06-08, MRN [id number]  PCP:  System, Pcp Not In  Cardiologist:  [name], MD    Referring MD: No ref. provider found   Chief Complaint  Patient presents with  . Follow-up  . Edema  . Shortness of Breath  . Cough  . Dizziness  I have a Day of symptoms  History of Present Illness:    Heather Day is a 32 y.o. female with history of endocarditis she has been doing very well.  She was also a smoker and drug abuser she is clean she does not smoke anymore she can drop when she is doing well however 4.  Of last few weeks she experienced swelling both arms and legs worse at evening time, also palpitations when she feel her heart spitting up can happen at any situation also described to have dizziness dizziness happened when she gets up very quickly when she turned her head from side to side.  All the symptoms are very worrisome.  Denies have any chest pain tightness squeezing pressure burning chest.  Also concerned about her weight which is increasing quite significantly.  Past Medical History:  Diagnosis Date  . Drug abuse, IV (HCC)   . Gallstone   . Heartburn   . Hepatitis C   . Rheumatic tricuspid insufficiency     Past Surgical History:  Procedure Laterality Date  . CESAREAN SECTION    . LAPAROSCOPIC APPENDECTOMY N/A 07/04/2017   Procedure: APPENDECTOMY LAPAROSCOPIC;  Surgeon: Luretha Murphy, MD;  Location: [facility];  Service: General;  Laterality: N/A;    Current Medications: Current Meds  Medication Sig  . omeprazole (PRILOSEC) 20 MG capsule Take 1 capsule (20 mg total) by mouth 2 (two) times daily before a meal.     Allergies:   Patient has no known allergies.   Social History   Socioeconomic History  . Marital status: Single    Spouse name: Not on file  . Number of children: Not on file  . Years of education: Not on file  . Highest education level: Not on file  Occupational History  .  Not on file  Social Needs  . Financial resource strain: Not on file  . Food insecurity:    Worry: Not on file    Inability: Not on file  . Transportation needs:    Medical: Not on file    Non-medical: Not on file  Tobacco Use  . Smoking status: Former Smoker    Packs/day: 1.00    Years: 20.00    Pack years: 20.00    Types: Cigarettes  . Smokeless tobacco: Never Used  Substance and Sexual Activity  . Alcohol use: No  . 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 Sexual activity: Not on file  Lifestyle  . Physical activity:    Days per week: Not on file    Minutes per session: Not on file  . Stress: Not on file  Relationships  . Social connections:    Talks on phone: Not on file    Gets together: Not on file    Attends religious service: Not on file    Active member of club or organization: Not on file    Attends meetings of clubs or organizations: Not on file    Relationship status: Not on file  Other Topics Concern  . Not on file  Social History Narrative  . Not on file     Family History: The patient's family history includes Healthy in her father; Hypotension in her mother; Hypothyroidism in her mother. ROS:   Please see the history of present illness.    All 14 point review of systems negative except as described per history of present illness  EKGs/Labs/Other Studies Reviewed:      Recent Labs: 03/14/2017: B Natriuretic Peptide 34.9 07/31/2017: ALT 13; BUN 15; Creatinine, Ser 0.65; Hemoglobin 13.1; Platelets 265; Potassium 3.8; Sodium 137  Recent Lipid Panel    Component Value Date/Time   CHOL 214 (H) 02/26/2017 1425   TRIG 184 (H) 02/26/2017 1425   HDL 45 02/26/2017 1425   CHOLHDL 4.8 (H) 02/26/2017 1425   LDLCALC 132 (H) 02/26/2017 1425    Physical Exam:    VS:  BP 110/68   Pulse 79   Ht 5\' 8"  (1.727 m)   Wt 296 lb 12.8 oz (134.6 kg)   SpO2 97%   BMI 45.13 kg/m     Wt Readings from  Last 3 Encounters:  09/04/17 296 lb 12.8 oz (134.6 kg)  07/31/17 270 lb (122.5 kg)  07/04/17 240 lb (108.9 kg)     GEN:  Well nourished, well developed in no acute distress HEENT: Normal NECK: No JVD; No carotid bruits LYMPHATICS: No lymphadenopathy CARDIAC: RRR, no murmurs, no rubs, no gallops RESPIRATORY:  Clear to auscultation without rales, wheezing or rhonchi  ABDOMEN: Soft, non-tender, non-distended MUSCULOSKELETAL:  No edema; No deformity  SKIN: Warm and dry LOWER EXTREMITIES: no swelling NEUROLOGIC:  Alert and oriented x 3 PSYCHIATRIC:  Normal affect   ASSESSMENT:    1. History of endocarditis   2. Palpitations   3. Dizziness    PLAN:    In order of problems listed above:  1. History of endocarditis.  With all her symptomatology that she is experiencing right now I will ask her to have echocardiogram to recheck on her valve status.  Luckily during my physical examination I do not hear any new findings. 2. Palpitations: I will ask her to have Holter monitor for 24 hours she says she gets palpitation multiple times during the day. 3. Dizziness will do Holter monitor to see if there is any significant arrhythmia. 4. Swelling I will ask her to have Chem-7 as well as proBNP.  See her back in my office in 1 month or sooner if she has a problem   Medication Adjustments/Labs and Tests Ordered: Current medicines are reviewed at length with the patient today.  Concerns regarding medicines are outlined above.  No orders of the defined types were placed in this encounter.  Medication changes: No orders of the defined types were placed in this encounter.   Signed, Georgeanna Leaobert J. Krasowski, MD, [facility] CenterFACC 09/04/2017 10:47 AM    [facility]

## 2017-09-04 NOTE — Addendum Note (Signed)
Addended by: Crist FatLOCKHART, CATHERINE P on: 09/04/2017 10:55 AM   Modules accepted: Orders

## 2017-09-04 NOTE — Addendum Note (Signed)
Addended by: Crist FatLOCKHART, CATHERINE P on: 09/04/2017 11:08 AM   Modules accepted: Orders

## 2017-09-04 NOTE — Patient Instructions (Signed)
Medication Instructions:  Your physician recommends that you continue on your current medications as directed. Please refer to the Current Medication list given to you today.   Labwork: Your physician recommends that you return for lab work today: BMP, ProBNP.   Testing/Procedures: Your physician has requested that you have an echocardiogram. Echocardiography is a painless test that uses sound waves to create images of your heart. It provides your doctor with information about the size and shape of your heart and how well your heart's chambers and valves are working. This procedure takes approximately one hour. There are no restrictions for this procedure.  Your physician has recommended that you wear a holter monitor. Holter monitors are medical devices that record the heart's electrical activity. Doctors most often use these monitors to diagnose arrhythmias. Arrhythmias are problems with the speed or rhythm of the heartbeat. The monitor is a small, portable device. You can wear one while you do your normal daily activities. This is usually used to diagnose what is causing palpitations/syncope (passing out). Wear for 24 hours.   Follow-Up: Your physician recommends that you schedule a follow-up appointment in: 1 month.   If you need a refill on your cardiac medications before your next appointment, please call your pharmacy.   Thank you for choosing [facility]! Mady Gemmaatherine Jeree Delcid, RN [phone]

## 2017-09-04 NOTE — Progress Notes (Signed)
Cardiology Office Note:    Date:  09/04/2017   ID:  Heather Day, DOB 01/18/1986, MRN [id number]  PCP:  System, Pcp Not In  Cardiologist:  Markell Sciascia, MD    Referring MD: No ref. provider found   Chief Complaint  Patient presents with  . Follow-up  . Edema  . Shortness of Breath  . Cough  . Dizziness  I have a lot of symptoms  History of Present Illness:    Heather Day is a 32 y.o. female with history of endocarditis she has been doing very well.  She was also a smoker and drug abuser she is clean she does not smoke anymore she can drop when she is doing well however 4.  Of last few weeks she experienced swelling both arms and legs worse at evening time, also palpitations when she feel her heart spitting up can happen at any situation also described to have dizziness dizziness happened when she gets up very quickly when she turned her head from side to side.  All the symptoms are very worrisome.  Denies have any chest pain tightness squeezing pressure burning chest.  Also concerned about her weight which is increasing quite significantly.  Past Medical History:  Diagnosis Date  . Drug abuse, IV (HCC)   . Gallstone   . Heartburn   . Hepatitis C   . Rheumatic tricuspid insufficiency     Past Surgical History:  Procedure Laterality Date  . CESAREAN SECTION    . LAPAROSCOPIC APPENDECTOMY N/A 07/04/2017   Procedure: APPENDECTOMY LAPAROSCOPIC;  Surgeon: Martin, Matthew, MD;  Location: [facility];  Service: General;  Laterality: N/A;    Current Medications: Current Meds  Medication Sig  . omeprazole (PRILOSEC) 20 MG capsule Take 1 capsule (20 mg total) by mouth 2 (two) times daily before a meal.     Allergies:   Patient has no known allergies.   Social History   Socioeconomic History  . Marital status: Single    Spouse name: Not on file  . Number of children: Not on file  . Years of education: Not on file  . Highest education level: Not on file  Occupational History  .  Not on file  Social Needs  . Financial resource strain: Not on file  . Food insecurity:    Worry: Not on file    Inability: Not on file  . Transportation needs:    Medical: Not on file    Non-medical: Not on file  Tobacco Use  . Smoking status: Former Smoker    Packs/day: 1.00    Years: 20.00    Pack years: 20.00    Types: Cigarettes  . Smokeless tobacco: Never Used  Substance and Sexual Activity  . Alcohol use: No  . 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 Sexual activity: Not on file  Lifestyle  . Physical activity:    Days per week: Not on file    Minutes per session: Not on file  . Stress: Not on file  Relationships  . Social connections:    Talks on phone: Not on file    Gets together: Not on file    Attends religious service: Not on file    Active member of club or organization: Not on file    Attends meetings of clubs or organizations: Not on file    Relationship status: Not on file    Other Topics Concern  . Not on file  Social History Narrative  . Not on file     Family History: The patient's family history includes Healthy in her father; Hypotension in her mother; Hypothyroidism in her mother. ROS:   Please see the history of present illness.    All 14 point review of systems negative except as described per history of present illness  EKGs/Labs/Other Studies Reviewed:      Recent Labs: 03/14/2017: B Natriuretic Peptide 34.9 07/31/2017: ALT 13; BUN 15; Creatinine, Ser 0.65; Hemoglobin 13.1; Platelets 265; Potassium 3.8; Sodium 137  Recent Lipid Panel    Component Value Date/Time   CHOL 214 (H) 02/26/2017 1425   TRIG 184 (H) 02/26/2017 1425   HDL 45 02/26/2017 1425   CHOLHDL 4.8 (H) 02/26/2017 1425   LDLCALC 132 (H) 02/26/2017 1425    Physical Exam:    VS:  BP 110/68   Pulse 79   Ht 5' 8" (1.727 m)   Wt 296 lb 12.8 oz (134.6 kg)   SpO2 97%   BMI 45.13 kg/m     Wt Readings from  Last 3 Encounters:  09/04/17 296 lb 12.8 oz (134.6 kg)  07/31/17 270 lb (122.5 kg)  07/04/17 240 lb (108.9 kg)     GEN:  Well nourished, well developed in no acute distress HEENT: Normal NECK: No JVD; No carotid bruits LYMPHATICS: No lymphadenopathy CARDIAC: RRR, no murmurs, no rubs, no gallops RESPIRATORY:  Clear to auscultation without rales, wheezing or rhonchi  ABDOMEN: Soft, non-tender, non-distended MUSCULOSKELETAL:  No edema; No deformity  SKIN: Warm and dry LOWER EXTREMITIES: no swelling NEUROLOGIC:  Alert and oriented x 3 PSYCHIATRIC:  Normal affect   ASSESSMENT:    1. History of endocarditis   2. Palpitations   3. Dizziness    PLAN:    In order of problems listed above:  1. History of endocarditis.  With all her symptomatology that she is experiencing right now I will ask her to have echocardiogram to recheck on her valve status.  Luckily during my physical examination I do not hear any new findings. 2. Palpitations: I will ask her to have Holter monitor for 24 hours she says she gets palpitation multiple times during the day. 3. Dizziness will do Holter monitor to see if there is any significant arrhythmia. 4. Swelling I will ask her to have Chem-7 as well as proBNP.  See her back in my office in 1 month or sooner if she has a problem   Medication Adjustments/Labs and Tests Ordered: Current medicines are reviewed at length with the patient today.  Concerns regarding medicines are outlined above.  No orders of the defined types were placed in this encounter.  Medication changes: No orders of the defined types were placed in this encounter.   Signed, Aliese Brannum J. Shilynn Hoch, MD, FACC 09/04/2017 10:47 AM    [facility] 

## 2017-09-05 LAB — BASIC METABOLIC PANEL
BUN/Creatinine Ratio: 16 (ref 9–23)
BUN: 12 mg/dL (ref 6–20)
CALCIUM: 9.2 mg/dL (ref 8.7–10.2)
CO2: 20 mmol/L (ref 20–29)
Chloride: 107 mmol/L — ABNORMAL HIGH (ref 96–106)
Creatinine, Ser: 0.75 mg/dL (ref 0.57–1.00)
GFR, EST AFRICAN AMERICAN: 120 mL/min/{1.73_m2} (ref >59)
GFR, EST NON AFRICAN AMERICAN: 104 mL/min/{1.73_m2} (ref >59)
Glucose: 91 mg/dL (ref 65–99)
POTASSIUM: 4.3 mmol/L (ref 3.5–5.2)
SODIUM: 140 mmol/L (ref 134–144)

## 2017-09-05 LAB — PRO B NATRIURETIC PEPTIDE: NT-Pro BNP: 242 pg/mL — ABNORMAL HIGH (ref 0–130)

## 2017-09-11 ENCOUNTER — Telehealth: Payer: Self-pay | Admitting: *Deleted

## 2017-09-11 NOTE — Telephone Encounter (Signed)
Pt phoned to say the swelling in her hands and feet is back again and wanted to know what she can do about it. Says her weight fluctuates in a 12 pd range. Please advise

## 2017-09-11 NOTE — Telephone Encounter (Signed)
Left voicemail for the patient to call the office. 

## 2017-09-24 ENCOUNTER — Ambulatory Visit: Payer: Self-pay

## 2017-09-24 ENCOUNTER — Ambulatory Visit (HOSPITAL_BASED_OUTPATIENT_CLINIC_OR_DEPARTMENT_OTHER)
Admission: RE | Admit: 2017-09-24 | Discharge: 2017-09-24 | Disposition: A | Discharge status: Home / Self Care | Payer: Self-pay | Source: Ambulatory Visit | Attending: Cardiology | Admitting: Cardiology

## 2017-09-24 ENCOUNTER — Ambulatory Visit: Payer: Self-pay | Admitting: Family Medicine

## 2017-09-24 DIAGNOSIS — R002 Palpitations: Secondary | ICD-10-CM | POA: Insufficient documentation

## 2017-09-24 DIAGNOSIS — R42 Dizziness and giddiness: Secondary | ICD-10-CM | POA: Insufficient documentation

## 2017-09-24 DIAGNOSIS — Z8679 Personal history of other diseases of the circulatory system: Secondary | ICD-10-CM | POA: Insufficient documentation

## 2017-09-24 DIAGNOSIS — I071 Rheumatic tricuspid insufficiency: Secondary | ICD-10-CM | POA: Insufficient documentation

## 2017-09-24 MED FILL — OMEPRAZOLE 20 MG CPDR: 20 | 30 days supply | Qty: 60 | Fill #1

## 2017-09-24 NOTE — Progress Notes (Signed)
  Echocardiogram 2D Echocardiogram has been performed.  Heather Day T Heather Day 09/24/2017, 1:27 PM

## 2017-09-28 ENCOUNTER — Other Ambulatory Visit: Payer: Self-pay | Admitting: *Deleted

## 2017-09-28 ENCOUNTER — Encounter: Payer: Self-pay | Admitting: *Deleted

## 2017-09-28 DIAGNOSIS — I361 Nonrheumatic tricuspid (valve) insufficiency: Secondary | ICD-10-CM

## 2017-09-28 DIAGNOSIS — Z01818 Encounter for other preprocedural examination: Secondary | ICD-10-CM

## 2017-10-03 ENCOUNTER — Encounter (HOSPITAL_COMMUNITY): Payer: Self-pay | Admitting: *Deleted

## 2017-10-03 ENCOUNTER — Other Ambulatory Visit: Payer: Self-pay

## 2017-10-03 ENCOUNTER — Ambulatory Visit (HOSPITAL_BASED_OUTPATIENT_CLINIC_OR_DEPARTMENT_OTHER)
Admission: RE | Admit: 2017-10-03 | Discharge: 2017-10-03 | Disposition: A | Discharge status: Home / Self Care | Payer: Self-pay | Source: Ambulatory Visit | Attending: Cardiology | Admitting: Cardiology

## 2017-10-03 ENCOUNTER — Encounter (HOSPITAL_COMMUNITY)
Admission: RE | Disposition: A | Discharge status: Home / Self Care | Payer: Self-pay | Source: Ambulatory Visit | Attending: Cardiology

## 2017-10-03 ENCOUNTER — Ambulatory Visit (HOSPITAL_COMMUNITY): Payer: Self-pay | Admitting: Certified Registered"

## 2017-10-03 ENCOUNTER — Ambulatory Visit (HOSPITAL_COMMUNITY)
Admission: RE | Admit: 2017-10-03 | Discharge: 2017-10-03 | Disposition: A | Discharge status: Home / Self Care | Payer: Self-pay | Source: Ambulatory Visit | Attending: Cardiology | Admitting: Cardiology

## 2017-10-03 DIAGNOSIS — R0602 Shortness of breath: Secondary | ICD-10-CM | POA: Insufficient documentation

## 2017-10-03 DIAGNOSIS — I361 Nonrheumatic tricuspid (valve) insufficiency: Secondary | ICD-10-CM

## 2017-10-03 DIAGNOSIS — R600 Localized edema: Secondary | ICD-10-CM | POA: Insufficient documentation

## 2017-10-03 DIAGNOSIS — R12 Heartburn: Secondary | ICD-10-CM | POA: Insufficient documentation

## 2017-10-03 DIAGNOSIS — Z87891 Personal history of nicotine dependence: Secondary | ICD-10-CM | POA: Insufficient documentation

## 2017-10-03 DIAGNOSIS — F1111 Opioid abuse, in remission: Secondary | ICD-10-CM | POA: Insufficient documentation

## 2017-10-03 DIAGNOSIS — Z9889 Other specified postprocedural states: Secondary | ICD-10-CM | POA: Insufficient documentation

## 2017-10-03 DIAGNOSIS — R42 Dizziness and giddiness: Secondary | ICD-10-CM | POA: Insufficient documentation

## 2017-10-03 DIAGNOSIS — R002 Palpitations: Secondary | ICD-10-CM | POA: Insufficient documentation

## 2017-10-03 DIAGNOSIS — Z8249 Family history of ischemic heart disease and other diseases of the circulatory system: Secondary | ICD-10-CM | POA: Insufficient documentation

## 2017-10-03 DIAGNOSIS — I071 Rheumatic tricuspid insufficiency: Secondary | ICD-10-CM

## 2017-10-03 DIAGNOSIS — Z8679 Personal history of other diseases of the circulatory system: Secondary | ICD-10-CM | POA: Insufficient documentation

## 2017-10-03 HISTORY — PX: TEE WITHOUT CARDIOVERSION: SHX5443

## 2017-10-03 LAB — PREGNANCY, URINE: Preg Test, Ur: NEGATIVE

## 2017-10-03 SURGERY — ECHOCARDIOGRAM, TRANSESOPHAGEAL
Anesthesia: Monitor Anesthesia Care

## 2017-10-03 MED ORDER — SODIUM CHLORIDE 0.9 % IV SOLN
INTRAVENOUS | Status: DC
  Administered 2017-10-03: 09:00:00 via INTRAVENOUS

## 2017-10-03 MED ORDER — PROPOFOL 500 MG/50ML IV EMUL
INTRAVENOUS | Status: DC | PRN
  Administered 2017-10-03: 100 ug/kg/min via INTRAVENOUS

## 2017-10-03 MED ORDER — GLYCOPYRROLATE 0.2 MG/ML IJ SOLN
INTRAMUSCULAR | Status: DC | PRN
  Administered 2017-10-03: .2 mg via INTRAVENOUS

## 2017-10-03 MED ORDER — PROPOFOL 10 MG/ML IV BOLUS
INTRAVENOUS | Status: DC | PRN
  Administered 2017-10-03: 20 mg via INTRAVENOUS
  Administered 2017-10-03: 60 mg via INTRAVENOUS
  Administered 2017-10-03: 20 mg via INTRAVENOUS
  Administered 2017-10-03: 40 mg via INTRAVENOUS

## 2017-10-03 MED ORDER — BUTAMBEN-TETRACAINE-BENZOCAINE 2-2-14 % EX AERO
INHALATION_SPRAY | CUTANEOUS | Status: DC | PRN
  Administered 2017-10-03: 2 via TOPICAL

## 2017-10-03 MED ORDER — LIDOCAINE 2% (20 MG/ML) 5 ML SYRINGE
INTRAMUSCULAR | Status: DC | PRN
  Administered 2017-10-03: 100 mg via INTRAVENOUS

## 2017-10-03 NOTE — CV Procedure (Signed)
Brief TEE note  Full report to be filed in [employer]  Tee performed to evaluate TR.  Appears that posterior tricuspid leaflet (based on 3D images) may be partially destroyed. Area where it does not have tissue that coapts with anterior and septal leaflets. Severe eccentric TR seen.  No obvious vegetations. Aortic, mitral, and pulmonic valves unremarkable.  Sedation with propofol provided by anesthesia. No complications.  Jodelle RedBridgette Amry Cathy, MD

## 2017-10-03 NOTE — Interval H&P Note (Signed)
History and Physical Interval Note:  10/03/2017 8:21 AM  Heather Day  has presented today for surgery, with the diagnosis of regurgitation  The various methods of treatment have been discussed with the patient and family. After consideration of risks, benefits and other options for treatment, the patient has consented to  Procedure(s): TRANSESOPHAGEAL ECHOCARDIOGRAM (TEE) (N/A) as a surgical intervention .  The patient's history has been reviewed, patient examined, no change in status, stable for surgery.  I have reviewed the patient's chart and labs.  Questions were answered to the patient's satisfaction.     [name]

## 2017-10-03 NOTE — Discharge Instructions (Signed)

## 2017-10-03 NOTE — Anesthesia Preprocedure Evaluation (Addendum)
Anesthesia Evaluation  Patient identified by MRN, date of birth, ID band Patient awake    Reviewed: Allergy & Precautions, NPO status , Patient's Chart, lab work & pertinent test results  History of Anesthesia Complications Negative for: history of anesthetic complications  Airway Mallampati: II  TM Distance: >3 FB Neck ROM: Full    Dental  (+) Poor Dentition, Dental Advisory Given   Pulmonary Current Smoker, former smoker,    Pulmonary exam normal        Cardiovascular Normal cardiovascular exam+ Valvular Problems/Murmurs   Study Conclusions  - Left ventricle: The cavity size was normal. Wall thickness was normal. Systolic function was normal. The estimated ejection fraction was in the range of 55% to 60%. Wall motion was normal; there were no regional wall motion abnormalities. Left ventricular diastolic function parameters were normal. - Aortic valve: Valve area (VTI): 3.23 cm^2. Valve area (Vmax): 3.34 cm^2. Valve area (Vmean): 3.47 cm^2. - Right atrium: The atrium was mildly dilated. - Tricuspid valve: There was moderate regurgitation directed eccentrically and toward the septum.     Neuro/Psych negative neurological ROS  negative psych ROS   GI/Hepatic negative GI ROS, (+)     substance abuse  IV drug use, Hepatitis -, C  Endo/Other  Morbid obesity  Renal/GU      Musculoskeletal   Abdominal (+) + obese,   Peds  Hematology   Anesthesia Other Findings Heather Day  ECHO COMPLETE WO IMAGING ENHANCING AGENT  Order# [id number]  Reading physician: Heather Liter, MD Ordering physician: Heather Liter, MD Study date: 09/24/17 Result Notes for ECHOCARDIOGRAM COMPLETE   Notes recorded by Heather Miles, RN on 09/28/2017 at 3:20 PM EDT Patient informed of results and TEE has been scheduled for 10/02/17 with Dr. Sallyanne Day at [facility]. Reviewed pre-testing instructions with patient.  Patient verbalized understanding. No further questions. Copy of instructions sent in the mail. ------  Notes recorded by Heather Liter, MD on 09/27/2017 at 9:23 AM EDT Significant TR, needs TEE, if she wants to talk about ot needs to have appointement   Study Result   Result status: [place]*                       [name], [name] [address]                            [phone]  ------------------------------------------------------------------- Transthoracic Echocardiography  (Report amended )  Patient:    Heather Day, Heather Day MR #:       [id number] Study Date: 09/24/2017 Gender:     F Age:        32 Height:     172.7 cm Weight:     134.6 kg BSA:        2.61 m^2 Pt. Status: Room:   ATTENDING    Default, Provider [phone]  ORDERING     Jenne Campus, MD  REFERRING    Jenne Campus, MD  [place], [place]  SONOGRAPHER  Heather Day, RDCS  cc:  ------------------------------------------------------------------- LV EF: 55% -   60%  -------------------------------------------------------------------  History:   PMH:  Palpitations  Dizziness History of endocarditis. Tricuspid valve regurgitation. Endocarditis.  ------------------------------------------------------------------- Study Conclusions  - Left ventricle: The cavity size was normal. Systolic function was   normal. The estimated ejection fraction was in the range of 55%   to 60%. Wall motion was normal; there were no regional wall   motion abnormalities. Left ventricular diastolic function   parameters were normal. - Right atrium: The atrium was mildly to moderately dilated. - Tricuspid valve: There was moderate-severe regurgitation.  Impressions:  - Normal LVEF.   Moderate RA enlargement.   Mderate - severe TR.   Normal PAP.   TR is worst as compare to previous  echo from 2018. Possibly flail   TV leaflet.  ------------------------------------------------------------------- Study data:  Comparison was made to the study of 03/09/2017.  Study status:  Routine    Reproductive/Obstetrics negative OB ROS                             Anesthesia Physical  Anesthesia Plan  ASA: III  Anesthesia Plan: MAC   Post-op Pain Management:    Induction: Intravenous  PONV Risk Score and Plan: 2 and Ondansetron and Dexamethasone  Airway Management Planned: Natural Airway and Mask  Additional Equipment:   Intra-op Plan:   Post-operative Plan:   Informed Consent: I have reviewed the patients History and Physical, chart, labs and discussed the procedure including the risks, benefits and alternatives for the proposed anesthesia with the patient or authorized representative who has indicated his/her understanding and acceptance.   Dental advisory given  Plan Discussed with: CRNA  Anesthesia Plan Comments:         Anesthesia Quick Evaluation

## 2017-10-03 NOTE — Anesthesia Postprocedure Evaluation (Signed)
Anesthesia Post Note  Patient: Heather Day  Procedure(s) Performed: TRANSESOPHAGEAL ECHOCARDIOGRAM (TEE) (N/A )     Patient location during evaluation: PACU Anesthesia Type: General Level of consciousness: awake Pain management: pain level controlled Vital Signs Assessment: post-procedure vital signs reviewed and stable Respiratory status: spontaneous breathing Cardiovascular status: stable Anesthetic complications: no    Last Vitals:  Vitals:   10/03/17 1006 10/03/17 1020  BP: 96/65 110/74  Pulse:  88  Resp: 14 (!) 25  Temp: 36.6 C   SpO2: 98% 99%    Last Pain:  Vitals:   10/03/17 1020  TempSrc:   PainSc: 0-No pain                 Issak Goley Hommes

## 2017-10-03 NOTE — Transfer of Care (Signed)
Immediate Anesthesia Transfer of Care Note  Patient: Heather Day  Procedure(s) Performed: TRANSESOPHAGEAL ECHOCARDIOGRAM (TEE) (N/A )  Patient Location: PACU  Anesthesia Type:MAC  Level of Consciousness: awake, alert , oriented and patient cooperative  Airway & Oxygen Therapy: Patient Spontanous Breathing and Patient connected to nasal cannula oxygen  Post-op Assessment: Report given to RN and Post -op Vital signs reviewed and stable  Post vital signs: Reviewed and stable  Last Vitals:  Vitals Value Taken Time  BP 96/65 10/03/2017 10:06 AM  Temp 36.6 C 10/03/2017 10:06 AM  Pulse 92 10/03/2017 10:07 AM  Resp 19 10/03/2017 10:07 AM  SpO2 99 % 10/03/2017 10:07 AM  Vitals shown include unvalidated device data.  Last Pain:  Vitals:   10/03/17 1006  TempSrc: Oral  PainSc: 0-No pain         Complications: No apparent anesthesia complications

## 2017-10-04 ENCOUNTER — Encounter (HOSPITAL_COMMUNITY): Payer: Self-pay | Admitting: Cardiology

## 2017-10-05 ENCOUNTER — Ambulatory Visit (INDEPENDENT_AMBULATORY_CARE_PROVIDER_SITE_OTHER): Payer: Self-pay | Admitting: Cardiology

## 2017-10-05 ENCOUNTER — Encounter: Payer: Self-pay | Admitting: Cardiology

## 2017-10-05 VITALS — BP 110/64 | HR 68 | Ht 68.0 in | Wt 300.8 lb

## 2017-10-05 DIAGNOSIS — Z8679 Personal history of other diseases of the circulatory system: Secondary | ICD-10-CM

## 2017-10-05 DIAGNOSIS — R002 Palpitations: Secondary | ICD-10-CM

## 2017-10-05 DIAGNOSIS — I071 Rheumatic tricuspid insufficiency: Secondary | ICD-10-CM

## 2017-10-05 DIAGNOSIS — F1911 Other psychoactive substance abuse, in remission: Secondary | ICD-10-CM

## 2017-10-05 DIAGNOSIS — Z87898 Personal history of other specified conditions: Secondary | ICD-10-CM

## 2017-10-05 MED ORDER — FUROSEMIDE 40 MG PO TABS: 40.0000 mg | ORAL_TABLET | Freq: Every day | ORAL | 0 refills | Status: DC

## 2017-10-05 MED ORDER — POTASSIUM CHLORIDE ER 10 MEQ PO TBCR: 10.0000 meq | EXTENDED_RELEASE_TABLET | Freq: Every day | ORAL | 2 refills | Status: DC

## 2017-10-05 MED FILL — POTASSIUM CL ER 10 MEQ TAB: 10 | 30 days supply | Qty: 30 | Fill #0

## 2017-10-05 MED FILL — FUROSEMIDE 40 MG TAB: 40 | 90 days supply | Qty: 90 | Fill #0

## 2017-10-05 NOTE — Progress Notes (Signed)
Cardiology Office Note:    Date:  10/05/2017   ID:  Heather Day, DOB [birth year], MRN [id number]  PCP:  Default, Provider, MD  Cardiologist:  Jenne Campus, MD    Referring MD: No ref. provider found   Chief Complaint  Patient presents with  . 1 month follow up  Still feeling poorly  History of Present Illness:    Heather Day is a 32 y.o. 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  I met her about a year ago and we did echocardiogram at that time and echocardiogram showed moderate tricuspid regurgitation however lately she gained some weight also started feeling poorly still having some shortness of breath swelling of lower extremities and I reevaluated her tricuspid valve I repeated echocardiogram showed severe tricuspid regurgitation she eventually ended up having TEE done which showed destruction of the posterior leaflet of the tricuspid valve with severe eccentric tricuspid regurgitation.  She complained of having some atypical symptoms which includes weakness fatigue and shortness of breath also swelling of lower extremities.  On the physical examination I could not hear feel clearly hepatomegaly examination is somewhat limited because of the obesity.  I do not see clearly JVD.  She did wear a Holter monitor which did not show any significant abnormality.  Past Medical History:  Diagnosis Date  . Drug abuse, IV ([facility])   . Gallstone   . Heartburn   . Hepatitis C   . Rheumatic tricuspid insufficiency     Past Surgical History:  Procedure Laterality Date  . CESAREAN SECTION    . LAPAROSCOPIC APPENDECTOMY N/A 07/04/2017   Procedure: APPENDECTOMY LAPAROSCOPIC;  Surgeon: Johnathan Hausen, MD;  Location: [facility];  Service: General;  Laterality: N/A;  . TEE WITHOUT CARDIOVERSION N/A 10/03/2017   Procedure: TRANSESOPHAGEAL ECHOCARDIOGRAM (TEE);  Surgeon:  Buford Dresser, MD;  Location: [facility] ENDOSCOPY;  Service: Cardiovascular;  Laterality: N/A;    Current Medications: No outpatient medications have been marked as taking for the 10/05/17 encounter (Office Visit) with Park Liter, MD.     Allergies:   Patient has no known allergies.   Social History   Socioeconomic History  . Marital status: Single    Spouse name: Not on file  . Number of children: Not on file  . Years of education: Not on file  . Highest education level: Not on file  Occupational History  . Not on file  Social Needs  . Financial resource strain: Not on file  . Food insecurity:    Worry: Not on file    Inability: Not on file  . Transportation needs:    Medical: Not on file    Non-medical: Not on file  Tobacco Use  . Smoking status: Former Smoker    Packs/day: 1.00    Years: 20.00    Pack years: 20.00    Types: Cigarettes  . Smokeless tobacco: Never Used  Substance and Sexual Activity  . Alcohol use: No  . 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 Sexual activity: Not on file  Lifestyle  . Physical activity:    Days per week: Not on file    Minutes per session: Not on file  . Stress: Not on file  Relationships  . Social connections:    Talks on phone: Not on  file    Gets together: Not on file    Attends religious service: Not on file    Active member of club or organization: Not on file    Attends meetings of clubs or organizations: Not on file    Relationship status: Not on file  Other Topics Concern  . Not on file  Social History Narrative  . Not on file     Family History: The patient's family history includes Healthy in her father; Hypotension in her mother; Hypothyroidism in her mother. ROS:   Please see the history of present illness.    All 14 point review of systems negative except as described per history of present illness  EKGs/Labs/Other Studies Reviewed:       TEE 10/03/2017: - Left ventricle: Systolic function was normal. The estimated   ejection fraction was in the range of 55% to 60%. Wall motion was   normal; there were no regional wall motion abnormalities. - Aortic valve: There was no regurgitation. - Mitral valve: There was trivial regurgitation. - Left atrium: No evidence of thrombus in the atrial cavity or   appendage. - Right atrium: No evidence of thrombus in the atrial cavity or   appendage. - Atrial septum: No defect or patent foramen ovale was identified. - Tricuspid valve: Tricuspid valve in 3D showed apparent partial   destruction of posterior leaflet leading to malcoaptation (See   image 80) There was severe regurgitation directed eccentrically   and toward the septum. Peak RV-RA gradient (S): 30 mm Hg. - Pulmonic valve: There was trivial regurgitation.  Impressions:  - Severe eccentric TR, initially appeared as malcoaptation but on   3D imaging, the posterior leaflet appears to be partially   destroyed. No evidence of current vegetation. No other   significant valvular abnormalities.  Recent Labs: 03/14/2017: B Natriuretic Peptide 34.9 07/31/2017: ALT 13; Hemoglobin 13.1; Platelets 265 09/04/2017: BUN 12; Creatinine, Ser 0.75; NT-Pro BNP 242; Potassium 4.3; Sodium 140  Recent Lipid Panel    Component Value Date/Time   CHOL 214 (H) 02/26/2017 1425   TRIG 184 (H) 02/26/2017 1425   HDL 45 02/26/2017 1425   CHOLHDL 4.8 (H) 02/26/2017 1425   LDLCALC 132 (H) 02/26/2017 1425    Physical Exam:    VS:  BP 110/64   Pulse 68   Ht 5' 8" (1.727 m)   Wt (!) 300 lb 12.8 oz (136.4 kg)   LMP 09/10/2017   SpO2 98%   BMI 45.74 kg/m     Wt Readings from Last 3 Encounters:  10/05/17 (!) 300 lb 12.8 oz (136.4 kg)  10/03/17 296 lb (134.3 kg)  09/04/17 296 lb 12.8 oz (134.6 kg)     GEN:  Well nourished, well developed in no acute distress HEENT: Normal NECK: No JVD; No carotid bruits LYMPHATICS: No  lymphadenopathy CARDIAC: RRR, holosystolic murmur 1/6 best heard at the lower portion of the sternum, no rubs, no gallops RESPIRATORY:  Clear to auscultation without rales, wheezing or rhonchi  ABDOMEN: Soft, non-tender, non-distended MUSCULOSKELETAL:  No edema; No deformity  SKIN: Warm and dry LOWER EXTREMITIES: no swelling NEUROLOGIC:  Alert and oriented x 3 PSYCHIATRIC:  Normal affect   ASSESSMENT:    1. Severe tricuspid regurgitation   2. Palpitations   3. History of drug abuse   4. History of endocarditis    PLAN:    In order of problems listed above:  1. Severe tricuspid regurgitation.  I will initiate diuretic therapy will put her on  40 mg of furosemide with 10 mg potassium week from today we will repeat Chem-7.  I will also refer her to Dr. Ricard Dillon for consideration of tricuspid valve repair.  However knowing that she does have destruction of the posterior leaflet of the tricuspid valve it could be difficult to may be even impossible to repair it.  My approach to this would be if we can repair her valve we may start thinking about doing this procedure however if the only way to fix severe regurgitation of the tricuspid valve will be replacement of the valve with biological of mechanical prosthesis will may delay the procedure. 2. Palpitations: her monitor did not show any significant arrhythmia. 3. History of drug abuse she is clean and she is been clean for more than a year. 4. 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  Plan as outlined above.  Of course she knows about endocarditis prophylaxis.  I spoke to Dr. Sherren Mocha regarding her case.  We discussed all problems and plan is as outlined above.   Medication Adjustments/Labs and Tests Ordered: Current medicines are reviewed at length with the patient today.  Concerns regarding medicines are outlined above.  No orders of the defined types were placed in this encounter.  Medication changes: No orders of the defined  types were placed in this encounter.   Signed, Park Liter, MD, [facility] 10/05/2017 11:47 AM    [place]

## 2017-10-05 NOTE — Patient Instructions (Signed)
Medication Instructions:  Your physician has recommended you make the following change in your medication:   START: Furosemide 40 mg once daily, and Potassium 10 meq once daily.   Labwork: Your physician recommends that you return for lab work in 1 week: BMP.   Testing/Procedures: None.  Follow-Up: Your physician recommends that you schedule a follow-up appointment in: 1 month.   Any Other Special Instructions Will Be Listed Below (If Applicable).  You have been referred to Dr.Owens with cardiothoracic surgery to discuss treatment for tricuspid valve regurgitation. They will call you with an appointment for this.      If you need a refill on your cardiac medications before your next appointment, please call your pharmacy.  Furosemide tablets What is this medicine? FUROSEMIDE (fyoor [place] se mide) is a diuretic. It helps you make more urine and to lose salt and excess water from your body. This medicine is used to treat high blood pressure, and edema or swelling from heart, kidney, or liver disease. This medicine may be used for other purposes; ask your health care provider or pharmacist if you have questions. COMMON BRAND NAME(S): Active-Medicated Specimen Kit, Delone, Diuscreen, Lasix, RX Specimen Collection Kit, Specimen Collection Kit, URINX Medicated Specimen Collection What should I tell my health care provider before I take this medicine? They need to know if you have any of these conditions: -abnormal blood electrolytes -diarrhea or vomiting -gout -heart disease -kidney disease, small amounts of urine, or difficulty passing urine -liver disease -thyroid disease -an unusual or allergic reaction to furosemide, sulfa drugs, other medicines, foods, dyes, or preservatives -pregnant or trying to get pregnant -breast-feeding How should I use this medicine? Take this medicine by mouth with a glass of water. Follow the directions on the prescription label. You may take this  medicine with or without food. If it upsets your stomach, take it with food or milk. Do not take your medicine more often than directed. Remember that you will need to pass more urine after taking this medicine. Do not take your medicine at a time of day that will cause you problems. Do not take at bedtime. Talk to your pediatrician regarding the use of this medicine in children. While this drug may be prescribed for selected conditions, precautions do apply. Overdosage: If you think you have taken too much of this medicine contact a poison control center or emergency room at once. NOTE: This medicine is only for you. Do not share this medicine with others. What if I miss a dose? If you miss a dose, take it as soon as you can. If it is almost time for your next dose, take only that dose. Do not take double or extra doses. What may interact with this medicine? -aspirin and aspirin-like medicines -certain antibiotics -chloral hydrate -cisplatin -cyclosporine -digoxin -diuretics -laxatives -lithium -medicines for blood pressure -medicines that relax muscles for surgery -methotrexate -NSAIDs, medicines for pain and inflammation like ibuprofen, naproxen, or indomethacin -phenytoin -steroid medicines like prednisone or cortisone -sucralfate -thyroid hormones This list may not describe all possible interactions. Give your health care provider a list of all the medicines, herbs, non-prescription drugs, or dietary supplements you use. Also tell them if you smoke, drink alcohol, or use illegal drugs. Some items may interact with your medicine. What should I watch for while using this medicine? Visit your doctor or health care professional for regular checks on your progress. Check your blood pressure regularly. Ask your doctor or health care professional what your blood  pressure should be, and when you should contact him or her. If you are a diabetic, check your blood sugar as directed. You may need  to be on a special diet while taking this medicine. Check with your doctor. Also, ask how many glasses of fluid you need to drink a day. You must not get dehydrated. You may get drowsy or dizzy. Do not drive, use machinery, or do anything that needs mental alertness until you know how this drug affects you. Do not stand or sit up quickly, especially if you are an older patient. This reduces the risk of dizzy or fainting spells. Alcohol can make you more drowsy and dizzy. Avoid alcoholic drinks. This medicine can make you more sensitive to the sun. Keep out of the sun. If you cannot avoid being in the sun, wear protective clothing and use sunscreen. Do not use sun lamps or tanning beds/booths. What side effects may I notice from receiving this medicine? Side effects that you should report to your doctor or health care professional as soon as possible: -blood in urine or stools -dry mouth -fever or chills -hearing loss or ringing in the ears -irregular heartbeat -muscle pain or weakness, cramps -skin rash -stomach upset, pain, or nausea -tingling or numbness in the hands or feet -unusually weak or tired -vomiting or diarrhea -yellowing of the eyes or skin Side effects that usually do not require medical attention (report to your doctor or health care professional if they continue or are bothersome): -headache -loss of appetite -unusual bleeding or bruising This list may not describe all possible side effects. Call your doctor for medical advice about side effects. You may report side effects to FDA at 1-800-FDA-1088. Where should I keep my medicine? Keep out of the reach of children. Store at room temperature between 15 and 30 degrees C (59 and 86 degrees F). Protect from light. Throw away any unused medicine after the expiration date. NOTE: This sheet is a summary. It may not cover all possible information. If you have questions about this medicine, talk to your doctor, pharmacist, or health  care provider.  2018 [employer] (2014-05-20 13:49:50)

## 2017-10-17 LAB — BASIC METABOLIC PANEL
BUN/Creatinine Ratio: 24 — ABNORMAL HIGH (ref 9–23)
BUN: 19 mg/dL (ref 6–20)
CALCIUM: 9.1 mg/dL (ref 8.7–10.2)
CHLORIDE: 96 mmol/L (ref 96–106)
CO2: 22 mmol/L (ref 20–29)
Creatinine, Ser: 0.78 mg/dL (ref 0.57–1.00)
GFR calc Af Amer: 115 mL/min/{1.73_m2} (ref >59)
GFR, EST NON AFRICAN AMERICAN: 99 mL/min/{1.73_m2} (ref >59)
GLUCOSE: 95 mg/dL (ref 65–99)
Potassium: 4.2 mmol/L (ref 3.5–5.2)
Sodium: 138 mmol/L (ref 134–144)

## 2017-10-23 NOTE — Addendum Note (Signed)
Addendum  created 10/23/17 2052 by Leilani AbleHatchett, Alice Vitelli, MD   Attestation recorded in Intraprocedure, Intraprocedure Attestations filed, Optician, dispensingntraprocedure Staff edited

## 2017-10-30 ENCOUNTER — Encounter: Payer: Self-pay | Admitting: Thoracic Surgery (Cardiothoracic Vascular Surgery)

## 2017-10-30 ENCOUNTER — Institutional Professional Consult (permissible substitution) (INDEPENDENT_AMBULATORY_CARE_PROVIDER_SITE_OTHER): Payer: Self-pay | Admitting: Thoracic Surgery (Cardiothoracic Vascular Surgery)

## 2017-10-30 ENCOUNTER — Other Ambulatory Visit: Payer: Self-pay | Admitting: *Deleted

## 2017-10-30 VITALS — BP 104/54 | HR 74 | Resp 20 | Ht 68.0 in | Wt 300.0 lb

## 2017-10-30 DIAGNOSIS — I071 Rheumatic tricuspid insufficiency: Secondary | ICD-10-CM

## 2017-10-30 NOTE — Progress Notes (Signed)
[address]       [place] [address]             [phone]     CARDIOTHORACIC SURGERY CONSULTATION REPORT  Referring Provider is [name], MD PCP is Default, Provider, MD  Chief Complaint  Patient presents with  . 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  The patient states that she was a heroin addict for approximately 10 years.  Approximately 3 years ago she was hospitalized with right-sided bacterial endocarditis complicated by severe tricuspid regurgitation and septic embolization to the lung.  She states that she was actually scheduled for tricuspid valve replacement but she left the hospital AMA.  At the time she lived in [place].  She was subsequently incarcerated in prison for a little over a year.  When she got out of prison she initially went back to using drugs but within 3 weeks made a decision to clean up her life.  She became completely abstinent and moved to [place] to live with her cousin.  She claims to have remained drug-free ever since, now approximately 15 months.  She developed progressive symptoms of atypical chest pain, shortness of breath, and severe generalized edema.  She was initially evaluated by Dr. Bing Matter in December 2018, and she has been followed carefully ever since.  In April 2019 she developed acute appendicitis and underwent laparoscopic appendectomy by Dr. [name].  CT scan performed at that time revealed cholelithiasis.  She recovered uneventfully.  In May she was evaluated in the emergency room and diagnosed with biliary colic.  In June she was seen in follow-up by Dr. Bing Matter  and notably complaining of worsened abdominal swelling and swelling in both arms and legs.  She has had atypical chest pain and palpitations.  Her weight had been trending up.  She underwent a Holter monitor that revealed only sinus rhythm with no suggestion of any sustained arrhythmias.  Follow-up echocardiogram revealed normal left ventricular systolic function with moderate right atrial enlargement and moderate to severe tricuspid regurgitation.  The tricuspid regurgitation reportedly looked worse than previous echocardiograms and there was question of a possible flail leaflet of the tricuspid valve.  TEE was performed October 03, 2017 and confirmed the presence of severe tricuspid regurgitation.  There was normal left ventricular systolic function with ejection fraction estimated 55 to 60%.  The aortic and mitral valves were normal.  The patient was subsequently referred for elective surgical consultation.  The patient is single and lives with her boyfriend in [place].  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  She complains of progressive exertional shortness of breath with swelling of both arms, both legs, and her abdomen.  Symptoms are transiently improved by diuretic therapy with Lasix but the patient continues to struggle.  She denies any exertional chest pain or chest tightness.  She reports occasional fleeting tightness across her chest.  She reports occasional palpitations without dizzy spells or syncope.  She has continued to gain weight, currently weighing 300 pounds.  She reports intermittent episodes of sharp right upper quadrant abdominal pain typically developed 45 minutes to 1 hour after meals.  Symptoms are  sometimes crampy in nature and usually subside.  She states that she weighed 160 pounds when she was in jail 3 years ago.  She maintains that she quit smoking last February and she has not used any type of illicit drug since she moved to [place] 15 months ago.  She  had a negative drug screen at the time of her initial evaluation in November 2018.  Past Medical History:  Diagnosis Date  . Drug abuse, IV (HCC)   . Gallstone   . Heartburn   . Hepatitis C   . Severe tricuspid regurgitation     Past Surgical History:  Procedure Laterality Date  . CESAREAN SECTION    . LAPAROSCOPIC APPENDECTOMY N/A 07/04/2017   Procedure: APPENDECTOMY LAPAROSCOPIC;  Surgeon: Luretha Murphy, MD;  Location: [facility];  Service: General;  Laterality: N/A;  . TEE WITHOUT CARDIOVERSION N/A 10/03/2017   Procedure: TRANSESOPHAGEAL ECHOCARDIOGRAM (TEE);  Surgeon: Jodelle Red, MD;  Location: [facility] ENDOSCOPY;  Service: Cardiovascular;  Laterality: N/A;    Family History  Problem Relation Age of Onset  . Hypothyroidism Mother   . Hypotension Mother   . Healthy Father     Social History   Socioeconomic History  . Marital status: Single    Spouse name: Not on file  . Number of children: Not on file  . Years of education: Not on file  . Highest education level: Not on file  Occupational History  . Not on file  Social Needs  . Financial resource strain: Not on file  . Food insecurity:    Worry: Not on file    Inability: Not on file  . Transportation needs:    Medical: Not on file    Non-medical: Not on file  Tobacco Use  . Smoking status: Former Smoker    Packs/day: 1.00    Years: 20.00    Pack years: 20.00    Types: Cigarettes  . Smokeless tobacco: Never Used  Substance and Sexual Activity  . Alcohol use: No  . 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 Sexual activity: Not on file  Lifestyle  . Physical activity:    Days per week: Not on file    Minutes per session: Not on file  . Stress: Not on file  Relationships  . Social connections:    Talks on phone: Not on file    Gets together: Not on file    Attends religious service: Not on file    Active member of club or organization: Not  on file    Attends meetings of clubs or organizations: Not on file    Relationship status: Not on file  . Intimate partner violence:    Fear of current or ex partner: Not on file    Emotionally abused: Not on file    Physically abused: Not on file    Forced sexual activity: Not on file  Other Topics Concern  . Not on file  Social History Narrative  . Not on file    Current Outpatient Medications  Medication Sig Dispense Refill  . furosemide (LASIX) 40 MG tablet Take 1 tablet (40 mg total) by mouth daily. 90 tablet 0  . ibuprofen (ADVIL,MOTRIN) 200 MG tablet Take 600 mg by mouth every 6 (six) hours as needed.    Marland Kitchen omeprazole (PRILOSEC) 20 MG capsule Take 1 capsule (20 mg total) by mouth 2 (  two) times daily before a meal. (Patient taking differently: Take 20 mg by mouth 2 (two) times daily as needed (heartburn). ) 60 capsule 11  . potassium chloride (K-DUR) 10 MEQ tablet Take 1 tablet (10 mEq total) by mouth daily. 30 tablet 2   No current facility-administered medications for this visit.     No Known Allergies    Review of Systems:   General:  decreased appetite, decreased energy, + weight gain, no weight loss, no fever  Cardiac:  no chest pain with exertion, fleeting atypical chest pain at rest, +SOB with low level exertion, occasional resting SOB, no PND, + orthopnea, + palpitations, no arrhythmia, no atrial fibrillation, + LE edema, + dizzy spells, no syncope  Respiratory:  + shortness of breath, no home oxygen, no productive cough, + dry cough, no bronchitis, + wheezing, no hemoptysis, no asthma, no pain with inspiration or cough, no sleep apnea, no CPAP at night  GI:   no difficulty swallowing, no reflux, + frequent heartburn, no hiatal hernia, + abdominal pain, no constipation, no diarrhea, no hematochezia, no hematemesis, no melena  GU:   no dysuria,  no frequency, no urinary tract infection, no hematuria, no kidney stones, no kidney disease  Vascular:  no pain suggestive of  claudication, no pain in feet, + leg cramps, + varicose veins, no DVT, no non-healing foot ulcer  Neuro:   no stroke, no TIA's, no seizures, no headaches, no temporary blindness one eye,  no slurred speech, no peripheral neuropathy, no chronic pain, no instability of gait, no memory/cognitive dysfunction  Musculoskeletal: no arthritis, no joint swelling, no myalgias, no difficulty walking, normal mobility   Skin:   no rash, no itching, no skin infections, no pressure sores or ulcerations  Psych:   + anxiety, + depression, + nervousness, no unusual recent stress  Eyes:   no blurry vision, no floaters, no recent vision changes, does not wears glasses or contacts  ENT:   no hearing loss, no loose or painful teeth, no dentures, last saw dentist 3 years ago while in hospital  Hematologic:  no easy bruising, no abnormal bleeding, no clotting disorder, no frequent epistaxis  Endocrine:  no diabetes, does not check CBG's at home     Physical Exam:   BP (!) 104/54   Pulse 74   Resp 20   Ht 5\' 8"  (1.727 m)   Wt 300 lb (136.1 kg)   LMP 10/09/2017   SpO2 98% Comment: RA  BMI 45.61 kg/m   General:  Morbidly obese,  well-appearing  HEENT:  Unremarkable   Neck:   no JVD, no bruits, no adenopathy   Chest:   clear to auscultation, symmetrical breath sounds, no wheezes, no rhonchi   CV:   RRR, no  murmur   Abdomen:  soft, non-tender, distended, no masses   Extremities:  warm, well-perfused, pulses palpable, mild LE edema  Rectal/GU  Deferred  Neuro:   Grossly non-focal and symmetrical throughout  Skin:   Clean and dry, no rashes, no breakdown   Diagnostic Tests:  Transthoracic Echocardiography  (Report amended )  Patient:    [place], Rion MR #:       [id number] Study Date: 09/24/2017 Gender:     F Age:        32 Height:     172.7 cm Weight:     134.6 kg BSA:        2.61 m^2 Pt. Status: Room:   ATTENDING    Default, Provider  [id number]  ORDERING     [name], MD  REFERRING     [name], MD  PERFORMING   [facility]  SONOGRAPHER  Sinda Du, RDCS  cc:  ------------------------------------------------------------------- LV EF: 55% -   60%  ------------------------------------------------------------------- History:   PMH:  Palpitations  Dizziness History of endocarditis. Tricuspid valve regurgitation. Endocarditis.  ------------------------------------------------------------------- Study Conclusions  - Left ventricle: The cavity size was normal. Systolic function was   normal. The estimated ejection fraction was in the range of 55%   to 60%. Wall motion was normal; there were no regional wall   motion abnormalities. Left ventricular diastolic function   parameters were normal. - Right atrium: The atrium was mildly to moderately dilated. - Tricuspid valve: There was moderate-severe regurgitation.  Impressions:  - Normal LVEF.   Moderate RA enlargement.   [place]derate - severe TR.   Normal PAP.   TR is worst as compare to previous echo from 2018. Possibly flail   TV leaflet.  ------------------------------------------------------------------- Study data:  Comparison was made to the study of 03/09/2017.  Study status:  Routine.  Procedure:  Transthoracic echocardiography. Image quality was adequate.  Study completion:  There were no complications.          Transthoracic echocardiography.  M-mode, complete 2D, spectral Doppler, and color Doppler.  Birthdate: Patient birthdate: 1985/12/19.  Age:  Patient is 32 yr old.  Sex: Gender: female.    BMI: 45.1 kg/m^2.  Blood pressure:     114/81 Patient status:  Outpatient.  Study date:  Study date: 09/24/2017. Study time: 12:45 PM.  Location:  Echo laboratory.  -------------------------------------------------------------------  ------------------------------------------------------------------- Left ventricle:  The cavity size was normal. Systolic function was normal. The  estimated ejection fraction was in the range of 55% to 60%. Wall motion was normal; there were no regional wall motion abnormalities. The transmitral flow pattern was normal. The deceleration time of the early transmitral flow velocity was normal. The pulmonary vein flow pattern was normal. The tissue Doppler parameters were normal. Left ventricular diastolic function parameters were normal.  ------------------------------------------------------------------- Aortic valve:   Structurally normal valve. Trileaflet; normal thickness leaflets. Cusp separation was normal. Mobility was not restricted.  Doppler:  Transvalvular velocity was within the normal range. There was no stenosis. There was no regurgitation.  ------------------------------------------------------------------- Aorta:  Aortic root: The aortic root was normal in size.  ------------------------------------------------------------------- Mitral valve:   Structurally normal valve.   Mobility was not restricted.  Doppler:  Transvalvular velocity was within the normal range. There was no evidence for stenosis. There was no regurgitation.  ------------------------------------------------------------------- Left atrium:  The atrium was normal in size.  ------------------------------------------------------------------- Right ventricle:  The cavity size was normal. Wall thickness was normal. Systolic function was normal.  ------------------------------------------------------------------- Pulmonic valve:    Doppler:  Transvalvular velocity was within the normal range. There was no evidence for stenosis. There was trivial regurgitation.  ------------------------------------------------------------------- Tricuspid valve:   Structurally normal valve.    Doppler: Transvalvular velocity was within the normal range. There was moderate-severe  regurgitation.  ------------------------------------------------------------------- Pulmonary artery:   The main pulmonary artery was normal-sized. Systolic pressure was within the normal range.  ------------------------------------------------------------------- Right atrium:  The atrium was mildly to moderately dilated.  ------------------------------------------------------------------- Pericardium:  There was no pericardial effusion.  ------------------------------------------------------------------- Systemic veins: Inferior vena cava: The vessel was normal in size.  ------------------------------------------------------------------- Measurements   Left ventricle  Value        Reference  LV ID, ED, PLAX chordal         (H)     55.3  mm     43 - 52  LV ID, ES, PLAX chordal                 34    mm     23 - 38  LV fx shortening, PLAX chordal          39    %      >=29  LV PW thickness, ED                     8.16  mm     ----------  IVS/LV PW ratio, ED                     1.14         <=1.3  Stroke volume, 2D                       58    ml     ----------  Stroke volume/bsa, 2D                   22    ml/m^2 ----------  LV e&', lateral                          13.4  cm/s   ----------  LV E/e&', lateral                        3.67         ----------  LV e&', medial                           11.5  cm/s   ----------  LV E/e&', medial                         4.28         ----------  LV e&', average                          12.45 cm/s   ----------  LV E/e&', average                        3.95         ----------    Ventricular septum                      Value        Reference  IVS thickness, ED                       9.28  mm     ----------    LVOT                                    Value        Reference  LVOT ID, S                              20  mm     ----------  LVOT area                               3.14  cm^2   ----------  LVOT peak  velocity, S                   83.6  cm/s   ----------  LVOT mean velocity, S                   58    cm/s   ----------  LVOT VTI, S                             18.5  cm     ----------    Aorta                                   Value        Reference  Aortic root ID, ED                      28    mm     ----------  Ascending aorta ID, A-P, S              31    mm     ----------    Left atrium                             Value        Reference  LA ID, A-P, ES                          40    mm     ----------  LA ID/bsa, A-P                          1.53  cm/m^2 <=2.2  LA volume, S                            52.4  ml     ----------  LA volume/bsa, S                        20.1  ml/m^2 ----------  LA volume, ES, 1-p A4C                  47.7  ml     ----------  LA volume/bsa, ES, 1-p A4C              18.3  ml/m^2 ----------  LA volume, ES, 1-p A2C                  50.1  ml     ----------  LA volume/bsa, ES, 1-p A2C              19.2  ml/m^2 ----------    Mitral valve                            Value        Reference  Mitral E-wave peak velocity  49.2  cm/s   ----------  Mitral A-wave peak velocity             33.1  cm/s   ----------  Mitral deceleration time        (H)     327   ms     150 - 230  Mitral E/A ratio, peak                  1.5          ----------    Pulmonary arteries                      Value        Reference  PA pressure, S, DP                      26    mm Hg  <=30    Tricuspid valve                         Value        Reference  Tricuspid regurg peak velocity          238   cm/s   ----------  Tricuspid peak RV-RA gradient           23    mm Hg  ----------    Right atrium                            Value        Reference  RA ID, S-I, ES, A4C             (H)     62.4  mm     34 - 49  RA area, ES, A4C                (H)     26.2  cm^2   8.3 - 19.5  RA volume, ES, A/L                      91.4  ml     ----------  RA volume/bsa, ES, A/L                  35     ml/m^2 ----------    Systemic veins                          Value        Reference  Estimated CVP                           3     mm Hg  ----------    Right ventricle                         Value        Reference  RV ID, minor axis, ED, A4C base         52.2  mm     ----------  TAPSE                                   27.7  mm     ----------  RV pressure, S, DP  26    mm Hg  <=30  RV s&', lateral, S                       14    cm/s   ----------  Legend: (L)  and  (H)  mark values outside specified reference range.  ------------------------------------------------------------------- Lora Havens, MD 2019-07-15T15:48:34   Transesophageal Echocardiography  Patient:    [place], Scheiderer MR #:       [id number] Study Date: 10/03/2017 Gender:     F Age:        32 Height:     172.7 cm Weight:     134.3 kg BSA:        2.61 m^2 Pt. Status: Room:   SONOGRAPHER  Perley Jain, RDCS  ORDERING     Cristal Deer, Bridgette  PERFORMING   Cristal Deer, Bridgette  cc:  ------------------------------------------------------------------- LV EF: 55% -   60%  ------------------------------------------------------------------- Indications:      Tricuspid regurgitation.  ------------------------------------------------------------------- Study Conclusions  - Left ventricle: Systolic function was normal. The estimated   ejection fraction was in the range of 55% to 60%. Wall motion was   normal; there were no regional wall motion abnormalities. - Aortic valve: There was no regurgitation. - Mitral valve: There was trivial regurgitation. - Left atrium: No evidence of thrombus in the atrial cavity or   appendage. - Right atrium: No evidence of thrombus in the atrial cavity or   appendage. - Atrial septum: No defect or patent foramen ovale was identified. - Tricuspid valve: Tricuspid valve in 3D showed apparent partial   destruction of posterior  leaflet leading to malcoaptation (See   image 80) There was severe regurgitation directed eccentrically   and toward the septum. Peak RV-RA gradient (S): 30 mm Hg. - Pulmonic valve: There was trivial regurgitation.  Impressions:  - Severe eccentric TR, initially appeared as malcoaptation but on   3D imaging, the posterior leaflet appears to be partially   destroyed. No evidence of current vegetation. No other   significant valvular abnormalities.  ------------------------------------------------------------------- Study data:   Study status:  Routine.  Consent:  The risks, benefits, and alternatives to the procedure were explained to the patient and informed consent was obtained.  Procedure:  The patient reported no pain pre or post test. Initial setup. The patient was brought to the laboratory. Surface ECG leads were monitored. Sedation. Conscious sedation was administered by anesthesiology staff. Transesophageal echocardiography. An adult multiplane transesophageal probe was inserted by the attending cardiologistwithout difficulty. Image quality was adequate.  Study completion:  The patient tolerated the procedure well. There were no complications.  Administered medications:   Propofol. Diagnostic transesophageal echocardiography.  2D and color Doppler.  Birthdate:  Patient birthdate: [birth year].  Age:  Patient is 32 yr old.  Sex:  Gender: female.    BMI: 45 kg/m^2.  Blood pressure: 110/74  Patient status:  Outpatient.  Study date:  Study date: 10/03/2017. Study time: 09:25 AM.  Location:  Endoscopy.  -------------------------------------------------------------------  ------------------------------------------------------------------- Left ventricle:  Systolic function was normal. The estimated ejection fraction was in the range of 55% to 60%. Wall motion was normal; there were no regional wall motion  abnormalities.  ------------------------------------------------------------------- Aortic valve:   Structurally normal valve. Trileaflet; normal thickness leaflets. Cusp separation was normal.  No evidence of vegetation.  Doppler:  There was no regurgitation.  ------------------------------------------------------------------- Aorta:  There was no atheroma. There was no evidence for dissection. Aortic root: The aortic root was  not dilated. Ascending aorta: The ascending aorta was normal in size. Aortic arch: The aortic arch was normal in size.  ------------------------------------------------------------------- Mitral valve:   Structurally normal valve.   Leaflet separation was normal.  No evidence of vegetation.  Doppler:  There was trivial regurgitation.  ------------------------------------------------------------------- Left atrium:  The atrium was normal in size.  No evidence of thrombus in the atrial cavity or appendage. The appendage was morphologically a left appendage, multilobulated, and of normal size. Emptying velocity was normal.  ------------------------------------------------------------------- Atrial septum:  No defect or patent foramen ovale was identified.   ------------------------------------------------------------------- Right ventricle:  The cavity size was normal. Wall thickness was normal. Systolic function was normal.  ------------------------------------------------------------------- Pulmonic valve:    Structurally normal valve.   Cusp separation was normal.  No evidence of vegetation.  Doppler:  There was trivial regurgitation.  ------------------------------------------------------------------- Tricuspid valve:   Doppler:  Tricuspid valve in 3D showed apparent partial destruction of posterior leaflet leading to malcoaptation (See image 80) There was severe regurgitation directed eccentrically and toward the  septum.  ------------------------------------------------------------------- Right atrium:  The atrium was normal in size.  No evidence of thrombus in the atrial cavity or appendage. The appendage was morphologically a right appendage.  ------------------------------------------------------------------- Pericardium:  There was no pericardial effusion.  ------------------------------------------------------------------- Measurements   Tricuspid valve                      Value  Tricuspid regurg peak velocity       274   cm/s  Tricuspid peak RV-RA gradient        30    mm Hg  Legend: (L)  and  (H)  mark values outside specified reference range.  ------------------------------------------------------------------- Prepared and Electronically Authenticated by  Jodelle Red 2019-07-24T15:15:27    [facility] REPORT:    Date of test:                 09/24/2017 Duration of test:           48 hours Indication:                    Palpitations Ordering physician:         [name] MD Referring physician:        [name] MD   Baseline rhythm: Normal sinus rhythm  Minimum heart rate: 56 BPM.  Average heart rate: 85BPM.  Maximal heart rate 149 BPM.  Atrial arrhythmia: 2 premature supraventricular beats  Ventricular arrhythmia: 1 premature ventricular beat  Conduction abnormality: None  Symptoms: None   Conclusion:  Normal Holter monitor.  Interpreting  cardiologist: [name], MD  Date: 10/05/2017 1:18 PM      Impression:  Patient has stage D severe symptomatic tricuspid regurgitation.  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  She claims to have remained abstinent from all illicit drug use for the past 15 months.  She continues to experience symptoms of right-sided heart failure with severe swelling, volume retention, abdominal bloating, and  exertional shortness of breath.  She also describes episodic right upper quadrant abdominal pain that is typically postprandial and consistent with biliary colic.  I have personally reviewed the patient's recent transthoracic and transesophageal echocardiograms.  Echocardiograms reveal severe tricuspid regurgitation with what appears to be perforation of the posterior leaflet of the tricuspid valve.  The right ventricle is dilated.  The right atrium is dilated.  Left ventricular  size and systolic function appears normal and all other valves are normal.  Previous abdominal CT scan documented the presence of gallstones.  The patient has poor dentition.  I agree that it would be reasonable to consider elective tricuspid valve repair or replacement.  Based upon review of the patient's recent TEE I feel there is a significant chance that her valve may be repairable.  She is clearly symptomatic and without some type of surgical intervention she will likely develop progressive right heart dysfunction eventually over time.  The patient gives a convincing history that she has remained abstinent from any type of illicit drug use.  However, there is no urgency associated with the need for surgical intervention as her severe tricuspid regurgitation has unquestionably been present since she developed endocarditis approximately 3 years ago.  The patient probably should have her gallbladder removed prior to surgery.  She definitely needs dental service consultation and probably will require dental extraction.   Plan:  I have discussed the nature of the patient's clinical problems and personally reviewed images from her recent TEE with the patient in the office today.  We discussed the indications, risk, and potential benefits of tricuspid valve repair or replacement.  Long-term prognosis with continued medical therapy without intervention was discussed.  Alternative surgical approaches have been discussed including a  comparison between conventional sternotomy and minimally-invasive techniques.  Because of the patient's morbid obesity I do not feel that she would likely be good candidate for minithoracotomy approach.  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  Timing of surgery was discussed including the need to eliminate any potential source for infection.  The rationale for proceeding with elective cholecystectomy in the setting of symptomatic cholelithiasis was discussed.  The need for dental extraction was discussed.  All of her questions been addressed.  Patient will be referred to Dr. [name] and/or his colleagues at [facility] to consider elective cholecystectomy.  The patient will also be referred for dental service consultation.  She will call our office and return for follow-up when she has recovered from these procedures so that we can make further arrangements for surgery.  At some point prior to surgery we will obtain a cardiac gated CT angiogram of the heart to evaluate her coronary anatomy.   I spent in excess of 90 minutes during the conduct of this office consultation and >50% of this time involved direct face-to-face encounter with the patient for counseling and/or coordination of their care.    Salvatore Decent. Cornelius Moras, MD 10/30/2017 2:20 PM

## 2017-10-30 NOTE — Patient Instructions (Signed)
Continue all previous medications without any changes at this time  

## 2017-10-31 ENCOUNTER — Other Ambulatory Visit: Payer: Self-pay | Admitting: *Deleted

## 2017-10-31 ENCOUNTER — Other Ambulatory Visit: Payer: Self-pay | Admitting: Thoracic Surgery (Cardiothoracic Vascular Surgery)

## 2017-10-31 DIAGNOSIS — K8010 Calculus of gallbladder with chronic cholecystitis without obstruction: Secondary | ICD-10-CM

## 2017-11-02 ENCOUNTER — Encounter (HOSPITAL_COMMUNITY): Payer: Self-pay | Admitting: Dentistry

## 2017-11-02 ENCOUNTER — Ambulatory Visit (HOSPITAL_COMMUNITY): Payer: Self-pay | Admitting: Dentistry

## 2017-11-02 VITALS — BP 126/83 | HR 77 | Temp 98.3°F

## 2017-11-02 DIAGNOSIS — K0530 Chronic periodontitis, unspecified: Secondary | ICD-10-CM

## 2017-11-02 DIAGNOSIS — K045 Chronic apical periodontitis: Secondary | ICD-10-CM

## 2017-11-02 DIAGNOSIS — K036 Deposits [accretions] on teeth: Secondary | ICD-10-CM

## 2017-11-02 DIAGNOSIS — K0889 Other specified disorders of teeth and supporting structures: Secondary | ICD-10-CM

## 2017-11-02 DIAGNOSIS — K08409 Partial loss of teeth, unspecified cause, unspecified class: Secondary | ICD-10-CM

## 2017-11-02 DIAGNOSIS — I071 Rheumatic tricuspid insufficiency: Secondary | ICD-10-CM

## 2017-11-02 DIAGNOSIS — K06010 Localized gingival recession, unspecified: Secondary | ICD-10-CM

## 2017-11-02 DIAGNOSIS — M264 Malocclusion, unspecified: Secondary | ICD-10-CM

## 2017-11-02 DIAGNOSIS — K029 Dental caries, unspecified: Secondary | ICD-10-CM

## 2017-11-02 DIAGNOSIS — K0850 Unsatisfactory restoration of tooth, unspecified: Secondary | ICD-10-CM

## 2017-11-02 DIAGNOSIS — Z8679 Personal history of other diseases of the circulatory system: Secondary | ICD-10-CM

## 2017-11-02 DIAGNOSIS — K083 Retained dental root: Secondary | ICD-10-CM

## 2017-11-02 MED FILL — OMEPRAZOLE 20 MG CPDR: 20 | 30 days supply | Qty: 60 | Fill #2

## 2017-11-02 MED FILL — POTASSIUM CHL ER M10 TABLET: 10 | 30 days supply | Qty: 30 | Fill #1

## 2017-11-02 NOTE — Progress Notes (Signed)
DENTAL CONSULTATION  Date of Consultation:  11/02/2017 Patient Name:   Heather Day Date of Birth:   10/23/1985 Medical Record Number: [id number]  VITALS: BP 126/83 (BP Location: Right Arm)   Pulse 77   Temp 98.3 F (36.8 C)   LMP 10/09/2017   CHIEF COMPLAINT: Patient was referred by Dr. Cornelius Moraswen for a dental consultation.  HPI: Heather Day is a 32 year old female with history of endocarditis and recently diagnosed with severe tricuspid regurgitation. Patient with anticipated heart valve surgery with Dr. Cornelius Moraswen. Patient is now seen as part of a pre-heart valve surgery dental protocol examination to rule out dental infection that may affect the patient's systemic health and anticipated heart valve surgery.  The patient currently denies acute toothaches, swellings, or abscesses. Patient knows that she has "bad teeth".  The patient last saw a dentist approximately 3 years ago in [place]. Patient had several teeth pulled at that time with no complications by patient report. The patient has been in [place] for approximately one year, but is not seen a dentist. Patient denies having any partial dentures. Patient denies having dental phobia.   PROBLEM LIST: Patient Active Problem List   Diagnosis Date Noted  . Severe tricuspid regurgitation     Priority: High  . Palpitations 09/04/2017  . Dizziness 09/04/2017  . Abdominal pain 03/16/2017  . Atypical chest pain 02/26/2017  . History of endocarditis 02/26/2017  . History of drug abuse 02/26/2017  . Smoking 02/26/2017    PMH: Past Medical History:  Diagnosis Date  . Drug abuse, IV (HCC)   . Gallstone   . Heartburn   . Hepatitis C   . Severe tricuspid regurgitation     PSH: Past Surgical History:  Procedure Laterality Date  . CESAREAN SECTION    . LAPAROSCOPIC APPENDECTOMY N/A 07/04/2017   Procedure: APPENDECTOMY LAPAROSCOPIC;  Surgeon: Luretha MurphyMartin, Matthew, MD;  Location: [facility];  Service: General;  Laterality: N/A;  . TEE WITHOUT  CARDIOVERSION N/A 10/03/2017   Procedure: TRANSESOPHAGEAL ECHOCARDIOGRAM (TEE);  Surgeon: Jodelle Redhristopher, Bridgette, MD;  [facility];  Service: Cardiovascular;  Laterality: N/A;    ALLERGIES: No Known Allergies  MEDICATIONS: Current Outpatient Medications  Medication Sig Dispense Refill  . furosemide (LASIX) 40 MG tablet Take 1 tablet (40 mg total) by mouth daily. 90 tablet 0  . ibuprofen (ADVIL,MOTRIN) 200 MG tablet Take 600 mg by mouth every 6 (six) hours as needed.    Marland Kitchen. omeprazole (PRILOSEC) 20 MG capsule Take 1 capsule (20 mg total) by mouth 2 (two) times daily before a meal. (Patient taking differently: Take 20 mg by mouth 2 (two) times daily as needed (heartburn). ) 60 capsule 11  . potassium chloride (K-DUR) 10 MEQ tablet Take 1 tablet (10 mEq total) by mouth daily. 30 tablet 2   No current facility-administered medications for this visit.     LABS: Lab Results  Component Value Date   WBC 6.5 07/31/2017   HGB 13.1 07/31/2017   HCT 37.0 07/31/2017   MCV 88.3 07/31/2017   PLT 265 07/31/2017      Component Value Date/Time   NA 138 10/16/2017 1600   K 4.2 10/16/2017 1600   CL 96 10/16/2017 1600   CO2 22 10/16/2017 1600   GLUCOSE 95 10/16/2017 1600   GLUCOSE 76 07/31/2017 1310   BUN 19 10/16/2017 1600   CREATININE 0.78 10/16/2017 1600   CALCIUM 9.1 10/16/2017 1600   GFRNONAA 99 10/16/2017 1600   GFRAA 115 10/16/2017 1600   No results  found for: INR, PROTIME No results found for: PTT  SOCIAL HISTORY: Social History   Socioeconomic History  . Marital status: Single    Spouse name: Not on file  . Number of children: Not on file  . Years of education: Not on file  . Highest education level: Not on file  Occupational History  . Not on file  Social Needs  . Financial resource strain: Not on file  . Food insecurity:    Worry: Not on file    Inability: Not on file  . Transportation needs:    Medical: Not on file    Non-medical: Not on file  Tobacco Use   . Smoking status: Former Smoker    Packs/day: 1.00    Years: 20.00    Pack years: 20.00    Types: Cigarettes  . Smokeless tobacco: Never Used  Substance and Sexual Activity  . Alcohol use: No  . 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 Sexual activity: Not on file  Lifestyle  . Physical activity:    Days per week: Not on file    Minutes per session: Not on file  . Stress: Not on file  Relationships  . Social connections:    Talks on phone: Not on file    Gets together: Not on file    Attends religious service: Not on file    Active member of club or organization: Not on file    Attends meetings of clubs or organizations: Not on file    Relationship status: Not on file  . Intimate partner violence:    Fear of current or ex partner: Not on file    Emotionally abused: Not on file    Physically abused: Not on file    Forced sexual activity: Not on file  Other Topics Concern  . Not on file  Social History Narrative  . Not on file    FAMILY HISTORY: Family History  Problem Relation Age of Onset  . Hypothyroidism Mother   . Hypotension Mother   . Healthy Father     REVIEW OF SYSTEMS: Reviewed with the patient as per History of present illness. Psych: Patient denies having dental phobia.  DENTAL HISTORY: CHIEF COMPLAINT: Patient was referred by Dr. Cornelius Moras for a dental consultation.  HPI: Heather Day is a 32 year old female with history of endocarditis and recently diagnosed with severe tricuspid regurgitation. Patient with anticipated heart valve surgery with Dr. Cornelius Moras. Patient is now seen as part of a pre-heart valve surgery dental protocol examination to rule out dental infection that may affect the patient's systemic health and anticipated heart valve surgery.  The patient currently denies acute toothaches, swellings, or abscesses. Patient knows that she has "bad teeth".  The patient last saw a  dentist approximately 3 years ago in [place]. Patient had several teeth pulled at that time with no complications by patient report. The patient has been in [place] for approximately one year, but is not seen a dentist. Patient denies having any partial dentures. Patient denies having dental phobia.  DENTAL EXAMINATION: GENERAL:  The patient is a well-developed, well-nourished female in no acute distress. HEAD AND NECK:  There is no palpable neck lymphadenopathy. The patient denies acute TMJ symptoms. INTRAORAL EXAM:  The patient has normal saliva. There is no evidence of oral abscess formation. DENTITION: The patient has multiple missing teeth numbers 1,  3, 4,16, 17, 18, 19, 28, 29, 30, and 32. Retained root segment in the area of #12. PERIODONTAL:  The patient has chronic periodontitis with plaque and calculus accumulations,gingival recession, and incipient tooth mobility. There is incipient to moderate bone loss noted. DENTAL CARIES/SUBOPTIMAL RESTORATIONS:  Patient has rampant dental caries as per dental charting form. Many of the dental caries are impinging on the pulp of multiple teeth.   ENDODONTIC:  The patient currently denies acute pulpitis symptoms. There is evidence of periapical pathology associated with the root segment #12. CROWN AND BRIDGE:  There are no crown or bridge restorations. PROSTHODONTIC: The patient denies having partial dentures. OCCLUSION:  Patient has a poor occlusal scheme secondary to multiple missing teeth, retained root segment #12, and lack of replacement of the missing teeth with dental prostheses.  RADIOGRAPHIC INTERPRETATION: An orthopantogram was taken and supplemented with a full series of dental radiographs. There are multiple missing teeth. There is retained root segments the area of #12. There is periapical radiolucency at the apex of tooth #12. There are rampant dental caries. Many of the caries are close to impinging on the pulp. There is  supra-eruption and drifting of the unopposed teeth into the edentulous areas.   ASSESSMENTS: 1. Severe tricuspid regurgitation 2. Pre-heart valve surgery dental protocol 3. History of hepatitis C 4. Chronic apical periodontitis 5. Retained root segment 6. Rampant dental caries 7. Chronic periodontitis with bone loss 8. Gingival recession 9. Accretions 10. Incipient tooth mobility 11.  Multiple missing teeth 12. Supra-eruption and drifting of the unopposed teeth into the edentulous areas 13. Poor occlusal scheme and malocclusion 14. Need for antibiotic premedication prior to invasive dental procedures due to history of endocarditis 14. Risk for complications up to and including death with anticipated invasive dental procedures in the operating room with general anesthesia due to patient's overall cardiovascular compromise.   PLAN/RECOMMENDATIONS: 1. I discussed the risks, benefits, and complications of various treatment options with the patient in relationship to her medical and dental conditions, history of endocarditis, anticipated heart valve surgery, and future risk for endocarditis. We discussed various treatment options to include no treatment, multiple extractions with alveoloplasty, pre-prosthetic surgery as indicated, periodontal therapy, dental restorations, root canal therapy, crown and bridge therapy, implant therapy, and replacement of missing teeth as indicated. An extensive discussion with the patient was had to make the patient aware of the ability to proceed with excavation of the dental caries with possible need for root canal therapy and crown or bridge therapy. After all questions were answered, the patient indictated that she currently wishes to proceed with extraction of remaining maxillary teeth as well as tooth #31 with alveoloplasty and gross debridement remaining dentition the operating room with general anesthesia.  The patient will then follow-up with a dentist of her  choice for additional dental restorations and evaluation for replacement of missing teeth with an upper complete and lower cast partial denture as indicated after adequate healing.  Antibiotic premedication will be provided prior to invasive dental procedures as per American Heart Association guidelines due to history of endocarditis. The patient agreed to proceed with preoperative impressions of her teeth at this time. Patient tolerated that procedure well.  The patient will currently follow-up with her cardiologist on 11/09/2017. An operating room procedure will then be scheduled for early to mid September 2019 for the dental procedures as above.  The patient will contact Dental Medicine if acute problems arise before then.   2. Discussion of findings with medical team  and coordination of future medical and dental care as needed.  I spent in excess of  120 minutes during the conduct of this consultation and >50% of this time involved direct face-to-face encounter for counseling and/or coordination of the patient's care.    Charlynne Pander, DDS

## 2017-11-02 NOTE — Patient Instructions (Signed)
The patient is to be scheduled for multiple extractions with alveoloplasty and gross debridement of remaining dentition the operating room with general anesthesia in early to mid September 2019. The patient is currently scheduled to see her cardiologist on 11/09/2017. We will use that note to act as the  H&P for the dental operating room procedure. After the extractions, the patient is to follow-up with a dentist of her choice for additional dental restorations and replacement missing teeth is indicated after adequate healing. Patient is to contact dental medicine if acute dental problems arise before the operating room procedure.  Dr. Kristin BruinsKulinski      [facility]    Department of Dental Medicine     DR. Pj Zehner      HEART VALVES AND MOUTH CARE:  FACTS:   If you have any infection in your mouth, it can infect your heart valve.  If you heart valve is infected, you will be seriously ill.  Infections in the mouth can be SILENT and do not always cause pain.  Examples of infections in the mouth are gum disease, dental cavities, and abscesses.  Some possible signs of infection are: Bad breath, bleeding gums, or teeth that are sensitive to sweets, hot, and/or cold. There are many other signs as well.  WHAT YOU HAVE TO DO:   Brush your teeth after meals and at bedtime. Spend at least 2 minutes brushing well, especially behind your back teeth and all around your teeth that stand alone. Brush at the gumline also.  Do not go to bed without brushing your teeth and flossing.  If you gums bleed when you brush or floss, do NOT stop brushing or flossing. It usually means that your gums need more attention and better cleaning.   If your Dentist or Dr. Kristin BruinsKulinski gave you a prescription mouthwash to use, make sure to use it as directed. If you run out of the medication, get a refill at the pharmacy.   If you were given any other medications or directions by your Dentist, please follow them. If you  did not understand the directions or forget what you were told, please call. We will be happy to refresh her memory.  If you need antibiotics before dental procedures, make sure you take them one hour prior to every dental visit as directed.   Get a dental checkup every 4-6 months in order to keep your mouth healthy, or to find and treat any new infection. You will most likely need your teeth cleaned or gums treated at the same time.  If you are not able to come in for your scheduled appointment, call your Dentist as soon as possible to reschedule.  If you have a problem in between dental visits, call your Dentist.

## 2017-11-04 ENCOUNTER — Encounter (HOSPITAL_BASED_OUTPATIENT_CLINIC_OR_DEPARTMENT_OTHER): Payer: Self-pay | Admitting: Emergency Medicine

## 2017-11-04 ENCOUNTER — Emergency Department (HOSPITAL_BASED_OUTPATIENT_CLINIC_OR_DEPARTMENT_OTHER): Payer: Self-pay

## 2017-11-04 ENCOUNTER — Emergency Department (HOSPITAL_BASED_OUTPATIENT_CLINIC_OR_DEPARTMENT_OTHER)
Admission: EM | Admit: 2017-11-04 | Discharge: 2017-11-04 | Disposition: A | Discharge status: Home / Self Care | Payer: Self-pay | Attending: Emergency Medicine | Admitting: Emergency Medicine

## 2017-11-04 ENCOUNTER — Other Ambulatory Visit: Payer: Self-pay

## 2017-11-04 DIAGNOSIS — Z87891 Personal history of nicotine dependence: Secondary | ICD-10-CM | POA: Insufficient documentation

## 2017-11-04 DIAGNOSIS — R1110 Vomiting, unspecified: Secondary | ICD-10-CM | POA: Insufficient documentation

## 2017-11-04 DIAGNOSIS — R1011 Right upper quadrant pain: Secondary | ICD-10-CM | POA: Insufficient documentation

## 2017-11-04 LAB — CBC WITH DIFFERENTIAL/PLATELET
Basophils Absolute: 0 10*3/uL (ref 0.0–0.1)
Basophils Relative: 0 %
EOS PCT: 2 %
Eosinophils Absolute: 0.1 10*3/uL (ref 0.0–0.7)
HCT: 38.7 % (ref 36.0–46.0)
HEMOGLOBIN: 13.1 g/dL (ref 12.0–15.0)
LYMPHS PCT: 30 %
Lymphs Abs: 2.1 10*3/uL (ref 0.7–4.0)
MCH: 30 pg (ref 26.0–34.0)
MCHC: 33.9 g/dL (ref 30.0–36.0)
MCV: 88.8 fL (ref 78.0–100.0)
Monocytes Absolute: 0.5 10*3/uL (ref 0.1–1.0)
Monocytes Relative: 8 %
NEUTROS ABS: 4.2 10*3/uL (ref 1.7–7.7)
Neutrophils Relative %: 60 %
PLATELETS: 173 10*3/uL (ref 150–400)
RBC: 4.36 MIL/uL (ref 3.87–5.11)
RDW: 12.4 % (ref 11.5–15.5)
WBC: 7 10*3/uL (ref 4.0–10.5)

## 2017-11-04 LAB — COMPREHENSIVE METABOLIC PANEL
ALK PHOS: 47 U/L (ref 38–126)
ALT: 19 U/L (ref 0–44)
AST: 23 U/L (ref 15–41)
Albumin: 3.8 g/dL (ref 3.5–5.0)
Anion gap: 10 (ref 5–15)
BUN: 20 mg/dL (ref 6–20)
CHLORIDE: 105 mmol/L (ref 98–111)
CO2: 24 mmol/L (ref 22–32)
CREATININE: 0.71 mg/dL (ref 0.44–1.00)
Calcium: 9.3 mg/dL (ref 8.9–10.3)
GFR calc Af Amer: >60 mL/min (ref >60)
Glucose, Bld: 113 mg/dL — ABNORMAL HIGH (ref 70–99)
Potassium: 4.1 mmol/L (ref 3.5–5.1)
Sodium: 139 mmol/L (ref 135–145)
Total Bilirubin: 0.2 mg/dL — ABNORMAL LOW (ref 0.3–1.2)
Total Protein: 7.2 g/dL (ref 6.5–8.1)

## 2017-11-04 LAB — URINALYSIS, ROUTINE W REFLEX MICROSCOPIC
BILIRUBIN URINE: NEGATIVE
Glucose, UA: NEGATIVE mg/dL
Ketones, ur: NEGATIVE mg/dL
LEUKOCYTES UA: NEGATIVE
Nitrite: NEGATIVE
PH: 5.5 (ref 5.0–8.0)
Protein, ur: NEGATIVE mg/dL
SPECIFIC GRAVITY, URINE: 1.02 (ref 1.005–1.030)

## 2017-11-04 LAB — URINALYSIS, MICROSCOPIC (REFLEX)

## 2017-11-04 LAB — LIPASE, BLOOD: LIPASE: 46 U/L (ref 11–51)

## 2017-11-04 LAB — PREGNANCY, URINE: PREG TEST UR: NEGATIVE

## 2017-11-04 MED ORDER — GI COCKTAIL ~~LOC~~
30.0000 mL | Freq: Once | ORAL | Status: AC
  Administered 2017-11-04: 30 mL via ORAL
  Filled 2017-11-04: qty 30

## 2017-11-04 MED ORDER — PANTOPRAZOLE SODIUM 40 MG PO TBEC
80.0000 mg | DELAYED_RELEASE_TABLET | Freq: Every day | ORAL | Status: DC
  Administered 2017-11-04: 80 mg via ORAL
  Filled 2017-11-04: qty 2

## 2017-11-04 MED ORDER — FENTANYL CITRATE (PF) 100 MCG/2ML IJ SOLN
50.0000 ug | Freq: Once | INTRAMUSCULAR | Status: AC
  Administered 2017-11-04: 50 ug via INTRAVENOUS
  Filled 2017-11-04: qty 2

## 2017-11-04 MED ORDER — PROMETHAZINE HCL 25 MG PO TABS: 25.0000 mg | ORAL_TABLET | Freq: Four times a day (QID) | ORAL | 0 refills | Status: DC | PRN

## 2017-11-04 MED ORDER — OXYCODONE-ACETAMINOPHEN 5-325 MG PO TABS: 1.0000 | ORAL_TABLET | Freq: Three times a day (TID) | ORAL | 0 refills | Status: DC | PRN

## 2017-11-04 MED ORDER — ONDANSETRON HCL 4 MG/2ML IJ SOLN
4.0000 mg | Freq: Once | INTRAMUSCULAR | Status: AC
  Administered 2017-11-04: 4 mg via INTRAVENOUS
  Filled 2017-11-04: qty 2

## 2017-11-04 MED ORDER — LACTATED RINGERS IV BOLUS
1000.0000 mL | Freq: Once | INTRAVENOUS | Status: AC
  Administered 2017-11-04: 1000 mL via INTRAVENOUS

## 2017-11-04 NOTE — ED Notes (Signed)
Attempted IV to RAC by Crystal RT and by nurse to LFA, tolerated well. Will try with U/S

## 2017-11-04 NOTE — ED Provider Notes (Signed)
Emergency Department Provider Note   I have reviewed the triage vital signs and the nursing notes.   HISTORY  Chief Complaint Abdominal Pain   HPI Heather Day is a 32 y.o. female with history of IV drug use and gallstones the presents to the emergency department today secondary to abdominal pain and vomiting.  Patient states that she is scheduled to get an ultrasound on Thursday for consideration of surgery afterwards but over the last 24 hours she had progressively worsening right-sided abdominal pain with severe nausea and vomiting that started out as just stomach contents and progressed to having yellow vomit.  No sick contacts.  No recent suspicious food intake.  No jaundice. No other associated or modifying symptoms.    Past Medical History:  Diagnosis Date  . Drug abuse, IV (HCC)   . Gallstone   . Heartburn   . Hepatitis C   . Severe tricuspid regurgitation     Patient Active Problem List   Diagnosis Date Noted  . 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 Dental caries 11/02/2017  . Retained dental root 11/02/2017  . Chronic periodontitis 11/02/2017  . Accretions on teeth 11/02/2017  . Palpitations 09/04/2017  . Dizziness 09/04/2017  . Abdominal pain 03/16/2017  . Atypical chest pain 02/26/2017  . History of endocarditis 02/26/2017  . History of drug abuse 02/26/2017  . Smoking 02/26/2017  . Severe tricuspid regurgitation     Past Surgical History:  Procedure Laterality Date  . CESAREAN SECTION    . LAPAROSCOPIC APPENDECTOMY N/A 07/04/2017   Procedure: APPENDECTOMY LAPAROSCOPIC;  Surgeon: Luretha MurphyMartin, Matthew, MD;  Location: [facility];  Service: General;  Laterality: N/A;  . TEE WITHOUT CARDIOVERSION N/A 10/03/2017   Procedure: TRANSESOPHAGEAL ECHOCARDIOGRAM (TEE);  Surgeon: Jodelle Redhristopher, Bridgette, MD;  [facility];  Service: Cardiovascular;  Laterality: N/A;    Current Outpatient Rx  . Order #: [id number] Class: Normal  . Order #: [id number] Class:  Historical Med  . Order #: [id number] Class: Normal  . Order #: [id number] Class: Print  . Order #: [id number] Class: Normal  . Order #: [id number] Class: Print    Allergies Patient has no known allergies.  Family History  Problem Relation Age of Onset  . Hypothyroidism Mother   . Hypotension Mother   . Healthy Father     Social History Social History   Tobacco Use  . Smoking status: Former Smoker    Packs/day: 1.00    Years: 20.00    Pack years: 20.00    Types: Cigarettes  . Smokeless tobacco: Never Used  Substance Use Topics  . Alcohol use: No  . 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 All pertinent positives and negatives as reviewed in the HPI. ____________________________________________   PHYSICAL EXAM:  VITAL SIGNS: ED Triage Vitals  Enc Vitals Group     BP 11/04/17 0727 122/84     Pulse Rate 11/04/17 0727 88     Resp 11/04/17 0727 18     Temp 11/04/17 0727 98.3 F (36.8 C)     Temp Source 11/04/17 0727 Oral     SpO2 11/04/17 0727 99 %     Weight 11/04/17 0728 300 lb (136.1 kg)     Height 11/04/17 0728 5\' 8"  (1.727 m)    Constitutional: Alert and oriented. Well appearing and in no  acute distress. Eyes: Conjunctivae are normal. PERRL. EOMI. Head: Atraumatic. Nose: No congestion/rhinnorhea. Mouth/Throat: Mucous membranes are moist.  Oropharynx non-erythematous. Neck: No stridor.  No meningeal signs.   Cardiovascular: Normal rate, regular rhythm. Good peripheral circulation. Grossly normal heart sounds.   Respiratory: Normal respiratory effort.  No retractions. Lungs CTAB. Gastrointestinal: Soft and nontender. No distention.  Musculoskeletal: No lower extremity tenderness nor edema. No gross deformities of extremities. Neurologic:  Normal speech and language. No gross focal neurologic deficits are appreciated.   Skin:  Skin is warm, dry and intact. No rash noted.   ____________________________________________   LABS (all labs ordered are listed, but only abnormal results are displayed)  Labs Reviewed  URINALYSIS, ROUTINE W REFLEX MICROSCOPIC - Abnormal; Notable for the following components:      Result Value   Hgb urine dipstick SMALL (*)    All other components within normal limits  COMPREHENSIVE METABOLIC PANEL - Abnormal; Notable for the following components:   Glucose, Bld 113 (*)    Total Bilirubin 0.2 (*)    All other components within normal limits  URINALYSIS, MICROSCOPIC (REFLEX) - Abnormal; Notable for the following components:   Bacteria, UA MANY (*)    All other components within normal limits  URINE CULTURE  PREGNANCY, URINE  CBC WITH DIFFERENTIAL/PLATELET  LIPASE, BLOOD   ____________________________________________  RADIOLOGY  US Abdomen Limited Ruq  Result Date: 11/04/2017 CLINICAL DATA:  Chronic right upper quadrant abdominal pain EXAM: ULTRASOUND ABDOMEN LIMITED RIGHT UPPER QUADRANT COMPARISON:  Ultrasound of Jul 31, 2017. FINDINGS: Gallbladder: Cholelithiasis is noted with the largest measuring 1.6 cm. No gallbladder wall thickening or pericholecystic fluid is noted. Sludge is noted within gallbladder lumen. Positive sonographic Murphy's sign is noted. Common bile duct: Diameter: 3 mm which is within normal limits. Liver: Stable 1 cm rounded echogenicity seen in left hepatic lobe. Within normal limits in parenchymal echogenicity. Portal vein is patent on color Doppler imaging with normal direction of blood flow towards the liver. IMPRESSION: Cholelithiasis is noted without gallbladder wall thickening or pericholecystic fluid, although positive sonographic Murphy's sign is noted. HIDA scan may be performed to evaluate for possible cholecystitis. Stable 1 cm rounded echogenicity seen in left hepatic lobe most consistent with hemangioma. Follow-up ultrasound in 1 year is  recommended to ensure stability. Electronically Signed   By: Lupita Raider, M.D.   On: 11/04/2017 10:12    ____________________________________________   PROCEDURES  Procedure(s) performed:   Procedures  Angiocath insertion Performed by: Marily Memos  Consent: Verbal consent obtained. Risks and benefits: risks, benefits and alternatives were discussed Time out: Immediately prior to procedure a "time out" was called to verify the correct patient, procedure, equipment, support staff and site/side marked as required.  Preparation: Patient was prepped and draped in the usual sterile fashion.  Vein Location: Right basilic  Ultrasound Guided  Gauge: 20  Normal blood return and flush without difficulty Patient tolerance: Patient tolerated the procedure well with no immediate complications.  ____________________________________________   INITIAL IMPRESSION / ASSESSMENT AND PLAN / ED COURSE  Treat symptoms and eval for acute cholecystitis.  If this is not present symptoms are improving will discharge to follow-up as necessary.  If persistently symptomatic or abnormal ultrasound will discuss with surgery.  [place] with cholelithiasis and sludge. Positive murphy's. suggests HIDA if persistent pain. reeval with still pain, nausea somewhat improved. Will d/w surgery. Unclear who her surgeon is.  Spoke with Dr. Gerrit Friends with [facility] who does not see indication for HIDA scan  at this moment and just needs close follow-up with her surgeon.  Patient is okay with this.  She is tolerating p.o.  Prescription for pain medication and nausea medicine given per surgery recommendations.  Patient will call [facility] tomorrow to get her appointment moved up.    Pertinent labs & imaging results that were available during my care of the patient were reviewed by me and considered in my medical decision making (see chart for details).  ____________________________________________  FINAL  CLINICAL IMPRESSION(S) / ED DIAGNOSES  Final diagnoses:  Vomiting  Right upper quadrant abdominal pain     MEDICATIONS GIVEN DURING THIS VISIT:  Medications  lactated ringers bolus 1,000 mL ( Intravenous Stopped 11/04/17 0957)  ondansetron (ZOFRAN) injection 4 mg (4 mg Intravenous Given 11/04/17 0851)  fentaNYL (SUBLIMAZE) injection 50 mcg (50 mcg Intravenous Given 11/04/17 0854)  gi cocktail (Maalox,Lidocaine,Donnatal) (30 mLs Oral Given 11/04/17 0852)     NEW OUTPATIENT MEDICATIONS STARTED DURING THIS VISIT:  Discharge Medication List as of 11/04/2017 11:01 AM    START taking these medications   Details  oxyCODONE-acetaminophen (PERCOCET) 5-325 MG tablet Take 1 tablet by mouth every 8 (eight) hours as needed., Starting Sun 11/04/2017, Print    promethazine (PHENERGAN) 25 MG tablet Take 1 tablet (25 mg total) by mouth every 6 (six) hours as needed for nausea or vomiting., Starting Sun 11/04/2017, Normal        Note:  This note was prepared with assistance of Dragon voice recognition software. Occasional wrong-word or sound-a-like substitutions may have occurred due to the inherent limitations of voice recognition software.   Marily Memos, MD 11/04/17 1452

## 2017-11-04 NOTE — ED Triage Notes (Signed)
RUQ pain x 2 months, with n/v. Hx of gallstones, scheduled for U/S on Thursday

## 2017-11-04 NOTE — ED Notes (Signed)
Rebekah RN attempted IV with U/S, unsuccessful. ED MD informed

## 2017-11-04 NOTE — ED Notes (Signed)
ED Provider at bedside for U/S IV 

## 2017-11-04 NOTE — ED Notes (Signed)
Patient is resting comfortably, awaiting U/S, husband at bedside

## 2017-11-04 NOTE — ED Notes (Signed)
Patient transported to Ultrasound 

## 2017-11-04 NOTE — ED Notes (Signed)
ED Provider at bedside. 

## 2017-11-05 ENCOUNTER — Other Ambulatory Visit (HOSPITAL_COMMUNITY): Payer: Self-pay | Admitting: Dentistry

## 2017-11-05 LAB — URINE CULTURE: CULTURE: NO GROWTH

## 2017-11-07 ENCOUNTER — Encounter: Payer: Self-pay | Admitting: Thoracic Surgery (Cardiothoracic Vascular Surgery)

## 2017-11-08 ENCOUNTER — Other Ambulatory Visit: Payer: Self-pay

## 2017-11-09 ENCOUNTER — Ambulatory Visit: Payer: Self-pay | Admitting: Cardiology

## 2017-11-23 NOTE — Pre-Procedure Instructions (Signed)
Heather Day  11/23/2017      [facility] [place] [id number] [place] [address] Phone: [phone] Fax: [phone]    Your procedure is scheduled on Thursday September 19.  [facility]:30 [facility].M.  Call this number if you have problems the morning of surgery:  [phone]   Remember:  Do not eat or drink after midnight.    Take these medicines the morning of surgery with A SIP OF WATER: omeprazole (prilosec), Phenergan if needed  7 days prior to surgery STOP taking any Aspirin(unless otherwise instructed by your surgeon), Aleve, Naproxen, Ibuprofen, Motrin, Advil, Goody's, BC's, all herbal medications, fish oil, and all vitamins     Do not wear jewelry, make-up or nail polish.  Do not wear lotions, powders, or perfumes, or deodorant.  Do not shave 48 hours prior to surgery.  Men may shave face and neck.  Do not bring valuables to the hospital.  [facility] is not responsible for any belongings or valuables.  Contacts, dentures or bridgework may not be worn into surgery.  Leave your suitcase in the car.  After surgery it may be brought to your room.  For patients admitted to the hospital, discharge time will be determined by your treatment team.  Patients discharged the day of surgery will not be allowed to drive home.   Special instructions:    [facility]- Preparing For Surgery  Before surgery, you can play an important role. Because skin is not sterile, your skin needs to be as free of germs as possible. You can reduce the number of germs on your skin by washing with CHG (chlorahexidine gluconate) Soap before surgery.  CHG is an antiseptic cleaner which kills germs and bonds with the skin to continue killing germs even after washing.    Oral Hygiene is also important to reduce your risk of infection.  Remember - BRUSH YOUR TEETH THE MORNING OF SURGERY  WITH YOUR REGULAR TOOTHPASTE  Please do not use if you have an allergy to CHG or antibacterial soaps. If your skin becomes reddened/irritated stop using the CHG.  Do not shave (including legs and underarms) for at least 48 hours prior to first CHG shower. It is OK to shave your face.  Please follow these instructions carefully.   1. Shower the NIGHT BEFORE SURGERY and the MORNING OF SURGERY with CHG.   2. If you chose to wash your hair, wash your hair first as usual with your normal shampoo.  3. After you shampoo, rinse your hair and body thoroughly to remove the shampoo.  4. Use CHG as you would any other liquid soap. You can apply CHG directly to the skin and wash gently with a scrungie or a clean washcloth.   5. Apply the CHG Soap to your body ONLY FROM THE NECK DOWN.  Do not use on open wounds or open sores. Avoid contact with your eyes, ears, mouth and genitals (private parts). Wash Face and genitals (private parts)  with your normal soap.  6. Wash thoroughly, paying special attention to the area where your surgery will be performed.  7. Thoroughly rinse your body with warm water from the neck down.  8. DO NOT shower/wash with your normal soap after using and rinsing off the CHG Soap.  9. Pat yourself dry with a CLEAN TOWEL.  10. Wear CLEAN PAJAMAS to bed  the night before surgery, wear comfortable clothes the morning of surgery  11. Place CLEAN SHEETS on your bed the night of your first shower and DO NOT SLEEP WITH PETS.    Day of Surgery:  Do not apply any deodorants/lotions.  Please wear clean clothes to the hospital/surgery center.   Remember to brush your teeth WITH YOUR REGULAR TOOTHPASTE.    Please read over the following fact sheets that you were given. Coughing and Deep Breathing and Surgical Site Infection Prevention

## 2017-11-26 ENCOUNTER — Inpatient Hospital Stay (HOSPITAL_COMMUNITY)
Admission: RE | Admit: 2017-11-26 | Discharge: 2017-11-26 | Disposition: A | Discharge status: Home / Self Care | Payer: Self-pay | Source: Ambulatory Visit

## 2017-11-26 NOTE — Progress Notes (Signed)
Heather Day did not come to PAT appointment. I called  X 3, "this number is not available at this time.  I notified Katie in Dr Luretha MurphyKulinski's office.

## 2017-11-26 NOTE — Progress Notes (Signed)
Anesthesia Chart Review: Patient did not show for her 11/26/17 PAT appointment and could not be reached at the phone number provided. RN notified Katie at Dr. Luretha Murphy office.   Case:  [id number] Date/Time:  11/29/17 0715   Procedure:  MULTIPLE EXTRACTION WITH ALVEOLOPLASTY AND GROSS DEBRIDEMENT F REMAINING TEETH (Bilateral )   Anesthesia type:  General   Pre-op diagnosis:  SEVERE TRICUSPID REGURGITATION AND CHRONIC PERIODONTITIS   [facility]  [facility]OM 10 / [facility]   Surgeon:  Charlynne Pander, DDS      DISCUSSION: Patient is a 32 year old female scheduled for the above procedure. She is being considered for tricuspid valve repair versus replacement in the future. She canceled her 11/09/17 cardiology visit with Dr. Bing Matter, but did see CT surgeon Dr. 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 History still needs to be updated, but last documented on 10/30/17 as being clean from heroin x 15 months. She moved to [facility] from [place] in early 2018. BMI is consistent with morbid obesity. - ED visit 11/04/17 for abdominal pain. U/S showed cholelithiasis, consider HIDA scan. ED provider spoke with Dr. Gerrit Friends with [facility] who does not see indication for HIDA at that time and advised close out-patient follow-up with surgeon.     Patient missed her PAT visit and canceled her 11/09/17 cardiology visit. She does have a consult note from CT surgery within the past 30 days. Staff message sent to Dr. Kristin Bruins.   PROVIDERS: Patient, No Pcp Per  - [name], MD is cardiologist. First established 02/26/17 for evaluation of chest pain with known tricuspid endocarditis history (had refused surgery when she lived in [place]). Testing as outline below with referral to CT surgery. Next visit scheduled for 12/06/17.  Tressie Stalker, MD  is CT surgeon. Last seen 10/30/17. He thinks it would be reasonable to consider elective tricuspid repair or replacement, but will need dental extractions first and may also need cholecystectomy as well. She was referred to [facility] [name], Molli Hazard, MD) and to Dr. Kristin Bruins. At some point he plans to obtain a cardiac gated CT angiogram of the heart to evaluate her coronary anatomy, but has not been ordered yet.     LABS: She will need updated labs prior to surgery. Most recent labs show: Lab Results  Component Value Date   WBC 7.0 11/04/2017   HGB 13.1 11/04/2017   HCT 38.7 11/04/2017   PLT 173 11/04/2017   GLUCOSE 113 (H) 11/04/2017   ALT 19 11/04/2017   AST 23 11/04/2017   NA 139 11/04/2017   K 4.1 11/04/2017   CL 105 11/04/2017   CREATININE 0.71 11/04/2017   BUN 20 11/04/2017   CO2 24 11/04/2017    IMAGES: Abdominal U/S 11/04/17: IMPRESSION: - Cholelithiasis is noted without gallbladder wall thickening or pericholecystic fluid, although positive sonographic Murphy's sign is noted. HIDA scan may be performed to evaluate for possible cholecystitis. - Stable 1 cm rounded echogenicity seen in left hepatic lobe most consistent with hemangioma. Follow-up ultrasound in 1 year is recommended to ensure stability.  CXR 03/14/17: IMPRESSION: No active cardiopulmonary disease.   EKG: 07/03/17: NSR, non-specific T wave abnormality.   CV: TEE 10/03/17: Study Conclusions - Left ventricle: Systolic function was normal. The estimated   ejection fraction was in the range of 55% to 60%. Wall motion was  normal; there were no regional wall motion abnormalities. - Aortic valve: There was no regurgitation. - Mitral valve: There was trivial regurgitation. - Left atrium: No evidence of thrombus in the atrial cavity or   appendage. - Right atrium: No evidence of thrombus in the atrial cavity or   appendage. - Atrial septum: No defect or patent foramen ovale was identified. - Tricuspid valve:  Tricuspid valve in 3D showed apparent partial   destruction of posterior leaflet leading to malcoaptation (See   image 80) There was severe regurgitation directed eccentrically   and toward the septum. Peak RV-RA gradient (S): 30 mm Hg. - Pulmonic valve: There was trivial regurgitation. Impressions: - Severe eccentric TR, initially appeared as malcoaptation but on   3D imaging, the posterior leaflet appears to be partially   destroyed. No evidence of current vegetation. No other   significant valvular abnormalities.  Echo (TTE) 09/24/17: Impressions: - Normal LVEF.   Moderate RA enlargement.   [place] - severe TR.   Normal PAP.   TR is worst as compare to previous echo from 2018. Possibly flail   TV leaflet.  48 hour Holter monitor 09/24/17: Baseline rhythm: Normal sinus rhythm Minimum heart rate: 56 BPM.  Average heart rate: 85BPM.  Maximal heart rate 149 BPM. Atrial arrhythmia: 2 premature supraventricular beats Ventricular arrhythmia: 1 premature ventricular beat Conduction abnormality: None Symptoms: None Conclusion:  Normal Holter monitor.   Past Medical History:  Diagnosis Date  . Drug abuse, IV (HCC)   . Gallstone   . Heartburn   . Hepatitis C   . Severe tricuspid regurgitation     Past Surgical History:  Procedure Laterality Date  . CESAREAN SECTION    . LAPAROSCOPIC APPENDECTOMY N/A 07/04/2017   Procedure: APPENDECTOMY LAPAROSCOPIC;  Surgeon: Luretha MurphyMartin, Matthew, MD;  Location: [facility];  Service: General;  Laterality: N/A;  . TEE WITHOUT CARDIOVERSION N/A 10/03/2017   Procedure: TRANSESOPHAGEAL ECHOCARDIOGRAM (TEE);  Surgeon: Jodelle Redhristopher, Bridgette, MD;  [facility];  Service: Cardiovascular;  Laterality: N/A;    MEDICATIONS: No current facility-administered medications for this encounter.    . furosemide (LASIX) 40 MG tablet  . ibuprofen (ADVIL,MOTRIN) 200 MG tablet  . ibuprofen (ADVIL,MOTRIN) 200 MG tablet  . omeprazole (PRILOSEC) 20 MG capsule  .  potassium chloride (K-DUR,KLOR-CON) 10 MEQ tablet  . promethazine (PHENERGAN) 25 MG tablet  . oxyCODONE-acetaminophen (PERCOCET) 5-325 MG tablet    Velna Ochsllison Kyonna Frier, PA-C [facility] Phone [phone] 11/26/2017 4:38 PM

## 2017-11-28 ENCOUNTER — Other Ambulatory Visit: Payer: Self-pay

## 2017-11-28 ENCOUNTER — Encounter (HOSPITAL_COMMUNITY)
Admission: RE | Admit: 2017-11-28 | Discharge: 2017-11-28 | Disposition: A | Discharge status: Home / Self Care | Payer: Self-pay | Source: Ambulatory Visit | Attending: Dentistry | Admitting: Dentistry

## 2017-11-28 ENCOUNTER — Encounter (HOSPITAL_COMMUNITY): Payer: Self-pay

## 2017-11-28 DIAGNOSIS — Z01818 Encounter for other preprocedural examination: Secondary | ICD-10-CM | POA: Insufficient documentation

## 2017-11-28 DIAGNOSIS — K0530 Chronic periodontitis, unspecified: Secondary | ICD-10-CM | POA: Insufficient documentation

## 2017-11-28 DIAGNOSIS — I071 Rheumatic tricuspid insufficiency: Secondary | ICD-10-CM | POA: Insufficient documentation

## 2017-11-28 DIAGNOSIS — Z8619 Personal history of other infectious and parasitic diseases: Secondary | ICD-10-CM | POA: Insufficient documentation

## 2017-11-28 DIAGNOSIS — Z79899 Other long term (current) drug therapy: Secondary | ICD-10-CM | POA: Insufficient documentation

## 2017-11-28 HISTORY — DX: Presence of spectacles and contact lenses: Z97.3

## 2017-11-28 HISTORY — DX: Chronic apical periodontitis: K04.5

## 2017-11-28 LAB — COMPREHENSIVE METABOLIC PANEL
ALT: 20 U/L (ref 0–44)
ANION GAP: 9 (ref 5–15)
AST: 23 U/L (ref 15–41)
Albumin: 3.5 g/dL (ref 3.5–5.0)
Alkaline Phosphatase: 51 U/L (ref 38–126)
BUN: 14 mg/dL (ref 6–20)
CHLORIDE: 103 mmol/L (ref 98–111)
CO2: 25 mmol/L (ref 22–32)
Calcium: 9.1 mg/dL (ref 8.9–10.3)
Creatinine, Ser: 0.82 mg/dL (ref 0.44–1.00)
GFR calc Af Amer: >60 mL/min (ref >60)
GFR calc non Af Amer: >60 mL/min (ref >60)
Glucose, Bld: 115 mg/dL — ABNORMAL HIGH (ref 70–99)
POTASSIUM: 4 mmol/L (ref 3.5–5.1)
Sodium: 137 mmol/L (ref 135–145)
Total Bilirubin: 0.7 mg/dL (ref 0.3–1.2)
Total Protein: 7.1 g/dL (ref 6.5–8.1)

## 2017-11-28 LAB — CBC
HCT: 41 % (ref 36.0–46.0)
Hemoglobin: 13.4 g/dL (ref 12.0–15.0)
MCH: 29.5 pg (ref 26.0–34.0)
MCHC: 32.7 g/dL (ref 30.0–36.0)
MCV: 90.3 fL (ref 78.0–100.0)
Platelets: 281 10*3/uL (ref 150–400)
RBC: 4.54 MIL/uL (ref 3.87–5.11)
RDW: 12.2 % (ref 11.5–15.5)
WBC: 8.8 10*3/uL (ref 4.0–10.5)

## 2017-11-28 LAB — PROTIME-INR
INR: 0.86
Prothrombin Time: 11.6 seconds (ref 11.4–15.2)

## 2017-11-28 MED ORDER — DEXTROSE 5 % IV SOLN
3.0000 g | INTRAVENOUS | Status: AC
  Administered 2017-11-29: 3 g via INTRAVENOUS
  Filled 2017-11-28: qty 3

## 2017-11-28 NOTE — Progress Notes (Signed)
Anesthesia PAT Evaluation:   Case:  [id number] Date/Time:  11/29/17 0715   Procedure:  MULTIPLE EXTRACTION WITH ALVEOLOPLASTY AND GROSS DEBRIDEMENT F REMAINING TEETH (Bilateral )   Anesthesia type:  General   Pre-op diagnosis:  SEVERE TRICUSPID REGURGITATION AND CHRONIC PERIODONTITIS   Location:  [facility] ROOM 10 / [facility] OR   Surgeon:  Charlynne PanderKulinski, Ronald F, DDS      DISCUSSION: Patient is a 32 year old female scheduled for the above procedure. She is being considered for tricuspid valve repair versus replacement in the future. She canceled her 11/09/17 cardiology visit with Dr. Bing MatterKrasowski, but did see CT surgeon Dr. 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 [place] [facility] [place]. BMI is consistent with morbid obesity. - ED visit 11/04/17 for abdominal pain. U/S showed cholelithiasis, consider HIDA scan. ED provider spoke with Dr.Gerkinwith [facility] who does not see indication for HIDA at that time and advised close out-patient follow-up with surgeon.    Patient seen at PAT. She is doing well. She reports compliance with medications. She remains free from illicit drug use. No chest pain, SOB, syncope. She notes fluid retention if she misses a dose of Lasix. She is able to clean her house without CV symptoms. She can breath okay lying flat, but often has to sleep upright with four pillows due to reflux. She denied any acute abdominal symptoms. She is scheduled to see Luretha MurphyMartin, Matthew, [facility] on 01/04/18 to discuss elective cholecystectomy.  She missed her 11/26/17 PAT visit because she was in the ED with her boyfriend who is suffering from scrotal cellulitis. They are actually headed back to the ED after today's PAT visit because  his symptoms are worsening. I did advise her that she will need to let Dr. Kristin BruinsKulinski know if she is unable to have her surgery as scheduled, and that she would have to be re-evaluated by cardiology or CT surgery if rescheduled because she needs an H&P within 30 days.   We discussed that nasoendotracheal intubation will be considered. She denied breathing/sinus issues, epistaxis, nasal fractures. She is not on any anti-platelet or anti-coagulation medications. Her nose and nares are somewhat small in size.  She has a nasal septum piercing which we discussed would need to be removed prior to surgery.  Anesthesiologist to evaluate on the day of surgery and discuss definitive anesthesia plan.    VS: BP 109/69   Pulse 72   Temp 37 C   Resp 20   Ht 5\' 8"  (1.727 m)   Wt (!) 142.7 kg   LMP 11/08/2017 (Approximate)   SpO2 98%   BMI 47.85 kg/m  Patient is a pleasant Caucasian female in NAD. No conversational dyspnea. She ambulates independently. Heart RRR. I did not appreciate a murmur. Lungs clear. Mild ankle and hand edema (has not taken Lasix yet today). No carotid bruits noted. Mallampati II.     PROVIDERS: Patient, No Pcp Per  - Gypsy BalsamKrasowski, Robert, MD is cardiologist. First established 02/26/17 for evaluation of chest pain with known tricuspid endocarditis history (had refused surgery when she lived in [place]). Testing as outline below with referral to CT surgery. Next visit scheduled for 12/06/17.  Tressie Stalker- Owen, Clarence, MD is CT surgeon. Last seen 10/30/17. He thinks  it would be reasonable to consider elective tricuspid repair or replacement, but will need dental extractions first and may also need cholecystectomy as well. She was referred to [facility] [name], Molli Hazard, MD) and to Dr. Kristin Bruins. At some point he plans to obtain a cardiac gated CT angiogram of the heart to evaluate her coronary anatomy, but has not been ordered yet.  She denied prior cardiac cath.    LABS: Labs reviewed: Acceptable for  surgery. (all labs ordered are listed, but only abnormal results are displayed)  Labs Reviewed  COMPREHENSIVE METABOLIC PANEL - Abnormal; Notable for the following components:      Result Value   Glucose, Bld 115 (*)    All other components within normal limits  PROTIME-INR  CBC    IMAGES: Abdominal U/S 11/04/17: IMPRESSION: - Cholelithiasis is noted without gallbladder wall thickening or pericholecystic fluid, although positive sonographic Murphy's sign is noted. HIDA scan may be performed to evaluate for possible cholecystitis. - Stable 1 cm rounded echogenicity seen in left hepatic lobe most consistent with hemangioma. Follow-up ultrasound in 1 year is recommended to ensure stability.  CXR 03/14/17: IMPRESSION: No active cardiopulmonary disease.   EKG: 07/03/17: NSR, non-specific T wave abnormality.   CV: TEE 10/03/17: Study Conclusions - Left ventricle: Systolic function was normal. The estimated ejection fraction was in the range of 55% to 60%. Wall motion was normal; there were no regional wall motion abnormalities. - Aortic valve: There was no regurgitation. - Mitral valve: There was trivial regurgitation. - Left atrium: No evidence of thrombus in the atrial cavity or appendage. - Right atrium: No evidence of thrombus in the atrial cavity or appendage. - Atrial septum: No defect or patent foramen ovale was identified. - Tricuspid valve: Tricuspid valve in 3D showed apparent partial destruction of posterior leaflet leading to malcoaptation (See image 80) There was severe regurgitation directed eccentrically and toward the septum. Peak RV-RA gradient (S): 30 mm Hg. - Pulmonic valve: There was trivial regurgitation. Impressions: - Severe eccentric TR, initially appeared as malcoaptation but on 3D imaging, the posterior leaflet appears to be partially destroyed. No evidence of current vegetation. No other significant valvular  abnormalities.  Echo (TTE) 09/24/17: Impressions: - Normal LVEF. Moderate RA enlargement. Mderate - severe TR. Normal PAP. TR is worst as compare to previous echo from 2018. Possibly flail TV leaflet.  48 hour Holter monitor 09/24/17: Baseline rhythm: Normal sinus rhythm Minimum heart rate: 56 BPM. Average heart rate: 85BPM. Maximal heart rate 149 BPM. Atrial arrhythmia: 2 premature supraventricular beats Ventricular arrhythmia: 1 premature ventricular beat Conduction abnormality: None Symptoms: None Conclusion: Normal Holter monitor.   Past Medical History:  Diagnosis Date  . Drug abuse, IV (HCC)   . Gallstone   . Heartburn   . Hepatitis C   . Periodontitis chronic, apical    dental caries  . Severe tricuspid regurgitation   . Wears glasses     Past Surgical History:  Procedure Laterality Date  . CESAREAN SECTION    . LAPAROSCOPIC APPENDECTOMY N/A 07/04/2017   Procedure: APPENDECTOMY LAPAROSCOPIC;  Surgeon: Luretha Murphy, MD;  Location: [facility]S;  Service: General;  Laterality: N/A;  . TEE WITHOUT CARDIOVERSION N/A 10/03/2017   Procedure: TRANSESOPHAGEAL ECHOCARDIOGRAM (TEE);  Surgeon: Jodelle Red, MD;  Location: [name] ENDOSCOPY;  Service: Cardiovascular;  Laterality: N/A;    MEDICATIONS: . furosemide (LASIX) 40 MG tablet  . ibuprofen (ADVIL,MOTRIN) 200 MG tablet  . ibuprofen (ADVIL,MOTRIN) 200 MG tablet  . omeprazole (PRILOSEC) 20 MG capsule  .  oxyCODONE-acetaminophen (PERCOCET) 5-325 MG tablet  . potassium chloride (K-DUR,KLOR-CON) 10 MEQ tablet  . promethazine (PHENERGAN) 25 MG tablet   No current facility-administered medications for this encounter.     Velna [facility]/Anesthesiology Phone [phone] 11/28/2017 12:54 PM

## 2017-11-28 NOTE — Anesthesia Preprocedure Evaluation (Addendum)
Anesthesia Evaluation  Patient identified by MRN, date of birth, ID band Patient awake    Reviewed: Allergy & Precautions, NPO status , Patient's Chart, lab work & pertinent test results  History of Anesthesia Complications Negative for: history of anesthetic complications  Airway Mallampati: II  TM Distance: >3 FB     Dental  (+) Poor Dentition, Dental Advidsory Given   Pulmonary Current Smoker, former smoker,    Pulmonary exam normal        Cardiovascular Normal cardiovascular exam+ Valvular Problems/Murmurs   Impressions:  - Severe eccentric TR, initially appeared as malcoaptation but on   3D imaging, the posterior leaflet appears to be partially   destroyed. No evidence of current vegetation. No other   significant valvular abnormalities.   Study Conclusions  - Left ventricle: The cavity size was normal. Wall thickness was normal. Systolic function was normal. The estimated ejection fraction was in the range of 55% to 60%. Wall motion was normal; there were no regional wall motion abnormalities. Left ventricular diastolic function parameters were normal. - Aortic valve: Valve area (VTI): 3.23 cm^2. Valve area (Vmax): 3.34 cm^2. Valve area (Vmean): 3.47 cm^2. - Right atrium: The atrium was mildly dilated. - Tricuspid valve: There was moderate regurgitation directed eccentrically and toward the septum.     Neuro/Psych negative neurological ROS  negative psych ROS   GI/Hepatic negative GI ROS, (+)     substance abuse  IV drug use, Hepatitis -, C  Endo/Other  Morbid obesity  Renal/GU      Musculoskeletal   Abdominal (+) - obese,   Peds  Hematology   Anesthesia Other Findings Heather Lotshly Tieu  ECHO COMPLETE WO IMAGING ENHANCING AGENT  Order# [id number]  Reading physician: Georgeanna LeaKrasowski, Robert J, MD Ordering physician: Georgeanna LeaKrasowski, Robert J, MD Study date: 09/24/17 Result Notes for ECHOCARDIOGRAM COMPLETE    Notes recorded by Crist FatLockhart, Catherine P, RN on 09/28/2017 at 3:20 PM EDT Patient informed of results and TEE has been scheduled for 10/02/17 with [facility] . Reviewed pre-testing instructions with patient. Patient verbalized understanding. No further questions. Copy of instructions sent in the mail. ------  Notes recorded by Georgeanna LeaKrasowski, Robert J, MD on 09/27/2017 at 9:23 AM EDT Significant TR, needs TEE, if she wants to talk about ot needs to have appointement   Study Result   Result status: Edited Result - FINAL                        *[facility] [place]*                       [address]                        [place], [place] [address]                            [phone]  ------------------------------------------------------------------- Transthoracic Echocardiography  (Report amended )  Patient:    Heather Day, Lavena MR #:       [id number] Study Date: 09/24/2017 Gender:     F Age:        32 Height:     172.7 cm Weight:     134.6 kg BSA:        2.61 m^2 Pt. Status: Room:   ATTENDING    Default, Provider [phone]  ORDERING  [name], MD  REFERRING    [name], MD  PERFORMING   [facility] [place]  SONOGRAPHER  Sinda Du, RDCS  cc:  ------------------------------------------------------------------- LV EF: 55% -   60%  ------------------------------------------------------------------- History:   PMH:  Palpitations  Dizziness History of endocarditis. Tricuspid valve regurgitation. Endocarditis.  ------------------------------------------------------------------- Study Conclusions  - Left ventricle: The cavity size was normal. Systolic function was   normal. The estimated ejection fraction was in the range of 55%   to 60%. Wall motion was normal; there were no regional wall   motion abnormalities. Left ventricular diastolic function   parameters were normal. - Right atrium: The atrium was  mildly to moderately dilated. - Tricuspid valve: There was moderate-severe regurgitation.  Impressions:  - Normal LVEF.   Moderate RA enlargement.   Mderate - severe TR.   Normal PAP.   TR is worst as compare to previous echo from 2018. Possibly flail   TV leaflet.  ------------------------------------------------------------------- Study data:  Comparison was made to the study of 03/09/2017.  Study status:  Routine    Reproductive/Obstetrics negative OB ROS                           Anesthesia Physical  Anesthesia Plan  ASA: III  Anesthesia Plan: General   Post-op Pain Management:    Induction: Intravenous  PONV Risk Score and Plan: 4 or greater and Ondansetron, Dexamethasone, Scopolamine patch - Pre-op and Diphenhydramine  Airway Management Planned: Nasal ETT  Additional Equipment:   Intra-op Plan:   Post-operative Plan: Extubation in OR  Informed Consent: I have reviewed the patients History and Physical, chart, labs and discussed the procedure including the risks, benefits and alternatives for the proposed anesthesia with the patient or authorized representative who has indicated his/her understanding and acceptance.   Dental Advisory Given and Dental advisory given  Plan Discussed with: CRNA, Anesthesiologist and Surgeon  Anesthesia Plan Comments:       Anesthesia Quick Evaluation

## 2017-11-28 NOTE — Progress Notes (Signed)
Pt denies SOB and chest pain. Pt under the care of Dr. Bing MatterKrasowski, Cardiology. PT denies having a stress test and cardiac cath. Pt denies recent labs. See anesthesia note.

## 2017-11-29 ENCOUNTER — Other Ambulatory Visit: Payer: Self-pay

## 2017-11-29 ENCOUNTER — Ambulatory Visit (HOSPITAL_COMMUNITY)
Admission: RE | Admit: 2017-11-29 | Discharge: 2017-11-29 | Disposition: A | Discharge status: Home / Self Care | Payer: Self-pay | Source: Ambulatory Visit | Attending: Dentistry | Admitting: Dentistry

## 2017-11-29 ENCOUNTER — Ambulatory Visit (HOSPITAL_COMMUNITY): Payer: Self-pay | Admitting: Physician Assistant

## 2017-11-29 ENCOUNTER — Encounter (HOSPITAL_COMMUNITY): Payer: Self-pay

## 2017-11-29 ENCOUNTER — Ambulatory Visit (HOSPITAL_COMMUNITY): Payer: Self-pay | Admitting: Vascular Surgery

## 2017-11-29 ENCOUNTER — Encounter (HOSPITAL_COMMUNITY)
Admission: RE | Disposition: A | Discharge status: Home / Self Care | Payer: Self-pay | Source: Ambulatory Visit | Attending: Dentistry

## 2017-11-29 DIAGNOSIS — K036 Deposits [accretions] on teeth: Secondary | ICD-10-CM

## 2017-11-29 DIAGNOSIS — B1920 Unspecified viral hepatitis C without hepatic coma: Secondary | ICD-10-CM | POA: Insufficient documentation

## 2017-11-29 DIAGNOSIS — K045 Chronic apical periodontitis: Secondary | ICD-10-CM

## 2017-11-29 DIAGNOSIS — K029 Dental caries, unspecified: Secondary | ICD-10-CM

## 2017-11-29 DIAGNOSIS — Z79899 Other long term (current) drug therapy: Secondary | ICD-10-CM | POA: Insufficient documentation

## 2017-11-29 DIAGNOSIS — K083 Retained dental root: Secondary | ICD-10-CM | POA: Insufficient documentation

## 2017-11-29 DIAGNOSIS — K0530 Chronic periodontitis, unspecified: Secondary | ICD-10-CM | POA: Insufficient documentation

## 2017-11-29 DIAGNOSIS — Z6841 Body Mass Index (BMI) 40.0 and over, adult: Secondary | ICD-10-CM | POA: Insufficient documentation

## 2017-11-29 DIAGNOSIS — Z87891 Personal history of nicotine dependence: Secondary | ICD-10-CM | POA: Insufficient documentation

## 2017-11-29 DIAGNOSIS — I071 Rheumatic tricuspid insufficiency: Secondary | ICD-10-CM | POA: Insufficient documentation

## 2017-11-29 HISTORY — PX: MULTIPLE EXTRACTIONS WITH ALVEOLOPLASTY: SHX5342

## 2017-11-29 LAB — POCT PREGNANCY, URINE: Preg Test, Ur: NEGATIVE

## 2017-11-29 SURGERY — MULTIPLE EXTRACTION WITH ALVEOLOPLASTY
Anesthesia: General | Laterality: Bilateral

## 2017-11-29 MED ORDER — OXYCODONE HCL 5 MG/5ML PO SOLN
ORAL | Status: AC
  Filled 2017-11-29: qty 10

## 2017-11-29 MED ORDER — LACTATED RINGERS IV SOLN
INTRAVENOUS | Status: DC | PRN
  Administered 2017-11-29 (×2): via INTRAVENOUS

## 2017-11-29 MED ORDER — OXYCODONE HCL 5 MG/5ML PO SOLN
7.5000 mg | Freq: Once | ORAL | Status: AC
  Administered 2017-11-29: 7.5 mg via ORAL

## 2017-11-29 MED ORDER — 0.9 % SODIUM CHLORIDE (POUR BTL) OPTIME
TOPICAL | Status: DC | PRN
  Administered 2017-11-29: 1000 mL

## 2017-11-29 MED ORDER — PROPOFOL 10 MG/ML IV BOLUS
INTRAVENOUS | Status: DC | PRN
  Administered 2017-11-29: 200 mg via INTRAVENOUS

## 2017-11-29 MED ORDER — ROCURONIUM BROMIDE 50 MG/5ML IV SOSY
PREFILLED_SYRINGE | INTRAVENOUS | Status: DC | PRN
  Administered 2017-11-29: 50 mg via INTRAVENOUS

## 2017-11-29 MED ORDER — PROMETHAZINE HCL 25 MG/ML IJ SOLN: 6.2500 mg | INTRAMUSCULAR | Status: DC | PRN

## 2017-11-29 MED ORDER — CEFAZOLIN SODIUM-DEXTROSE 2-4 GM/100ML-% IV SOLN
INTRAVENOUS | Status: AC
  Filled 2017-11-29: qty 100

## 2017-11-29 MED ORDER — FENTANYL CITRATE (PF) 250 MCG/5ML IJ SOLN
INTRAMUSCULAR | Status: AC
  Filled 2017-11-29: qty 5

## 2017-11-29 MED ORDER — SUGAMMADEX SODIUM 200 MG/2ML IV SOLN
INTRAVENOUS | Status: DC | PRN
  Administered 2017-11-29: 300 mg via INTRAVENOUS

## 2017-11-29 MED ORDER — OXYCODONE-ACETAMINOPHEN 5-325 MG PO TABS: ORAL_TABLET | ORAL | 0 refills | Status: DC

## 2017-11-29 MED ORDER — SODIUM CHLORIDE 0.9 % IV SOLN
INTRAVENOUS | Status: DC | PRN
  Administered 2017-11-29: 25 ug/min via INTRAVENOUS

## 2017-11-29 MED ORDER — LIDOCAINE-EPINEPHRINE 2 %-1:100000 IJ SOLN
INTRAMUSCULAR | Status: AC
  Filled 2017-11-29: qty 10.2

## 2017-11-29 MED ORDER — SCOPOLAMINE 1 MG/3DAYS TD PT72
MEDICATED_PATCH | TRANSDERMAL | Status: AC
  Administered 2017-11-29: 1.5 mg via TRANSDERMAL
  Filled 2017-11-29: qty 1

## 2017-11-29 MED ORDER — HEMOSTATIC AGENTS (NO CHARGE) OPTIME
TOPICAL | Status: DC | PRN
  Administered 2017-11-29: 1 via TOPICAL

## 2017-11-29 MED ORDER — SCOPOLAMINE 1 MG/3DAYS TD PT72
1.0000 | MEDICATED_PATCH | TRANSDERMAL | Status: DC
  Administered 2017-11-29: 1.5 mg via TRANSDERMAL

## 2017-11-29 MED ORDER — PROPOFOL 10 MG/ML IV BOLUS
INTRAVENOUS | Status: AC
  Filled 2017-11-29: qty 40

## 2017-11-29 MED ORDER — FENTANYL CITRATE (PF) 100 MCG/2ML IJ SOLN
INTRAMUSCULAR | Status: DC | PRN
  Administered 2017-11-29 (×4): 50 ug via INTRAVENOUS
  Administered 2017-11-29: 100 ug via INTRAVENOUS
  Administered 2017-11-29 (×2): 50 ug via INTRAVENOUS

## 2017-11-29 MED ORDER — LIDOCAINE-EPINEPHRINE 2 %-1:100000 IJ SOLN
INTRAMUSCULAR | Status: DC | PRN
  Administered 2017-11-29: 10.2 mL via INTRADERMAL

## 2017-11-29 MED ORDER — DEXMEDETOMIDINE HCL 200 MCG/2ML IV SOLN
INTRAVENOUS | Status: DC | PRN
  Administered 2017-11-29: 6 ug via INTRAVENOUS
  Administered 2017-11-29: 4 ug via INTRAVENOUS
  Administered 2017-11-29: 6 ug via INTRAVENOUS

## 2017-11-29 MED ORDER — HYDROMORPHONE HCL 1 MG/ML IJ SOLN: 0.2500 mg | INTRAMUSCULAR | Status: DC | PRN

## 2017-11-29 MED ORDER — OXYMETAZOLINE HCL 0.05 % NA SOLN
NASAL | Status: AC
Start: 2017-11-29 — End: ?
  Filled 2017-11-29: qty 15

## 2017-11-29 MED ORDER — OXYMETAZOLINE HCL 0.05 % NA SOLN
NASAL | Status: AC
  Filled 2017-11-29: qty 15

## 2017-11-29 MED ORDER — ONDANSETRON HCL 4 MG/2ML IJ SOLN
INTRAMUSCULAR | Status: DC | PRN
  Administered 2017-11-29: 4 mg via INTRAVENOUS

## 2017-11-29 MED ORDER — MIDAZOLAM HCL 2 MG/2ML IJ SOLN
INTRAMUSCULAR | Status: AC
  Filled 2017-11-29: qty 2

## 2017-11-29 MED ORDER — BUPIVACAINE-EPINEPHRINE (PF) 0.5% -1:200000 IJ SOLN
INTRAMUSCULAR | Status: AC
  Filled 2017-11-29: qty 3.6

## 2017-11-29 MED ORDER — DEXAMETHASONE SODIUM PHOSPHATE 10 MG/ML IJ SOLN
INTRAMUSCULAR | Status: DC | PRN
  Administered 2017-11-29: 10 mg via INTRAVENOUS

## 2017-11-29 MED ORDER — MIDAZOLAM HCL 5 MG/5ML IJ SOLN
INTRAMUSCULAR | Status: DC | PRN
  Administered 2017-11-29: 2 mg via INTRAVENOUS

## 2017-11-29 MED ORDER — BUPIVACAINE-EPINEPHRINE 0.5% -1:200000 IJ SOLN
INTRAMUSCULAR | Status: DC | PRN
  Administered 2017-11-29: 1.7 mL

## 2017-11-29 MED FILL — OXYCODONE-ACETAMINOPHEN 5-3: 5-325 | 4 days supply | Qty: 32 | Fill #0

## 2017-11-29 SURGICAL SUPPLY — 40 items
ALCOHOL 70% 16 OZ (MISCELLANEOUS) ×3 IMPLANT
ATTRACTOMAT 16X20 MAGNETIC DRP (DRAPES) ×3 IMPLANT
BANDAGE HEMOSTAT MRDH 4X4 STRL (MISCELLANEOUS) IMPLANT
BLADE SURG 15 STRL LF DISP TIS (BLADE) ×2 IMPLANT
BLADE SURG 15 STRL SS (BLADE) ×4
BNDG HEMOSTAT MRDH 4X4 STRL (MISCELLANEOUS)
COVER SURGICAL LIGHT HANDLE (MISCELLANEOUS) ×3 IMPLANT
GAUZE 4X4 16PLY RFD (DISPOSABLE) ×3 IMPLANT
GAUZE PACKING FOLDED 2  STR (GAUZE/BANDAGES/DRESSINGS) ×2
GAUZE PACKING FOLDED 2 STR (GAUZE/BANDAGES/DRESSINGS) ×1 IMPLANT
GAUZE SPONGE 4X4 12PLY STRL LF (GAUZE/BANDAGES/DRESSINGS) ×3 IMPLANT
GLOVE BIO SURGEON STRL SZ 6.5 (GLOVE) ×2 IMPLANT
GLOVE BIO SURGEONS STRL SZ 6.5 (GLOVE) ×1
GLOVE SURG ORTHO 8.0 STRL STRW (GLOVE) ×3 IMPLANT
GOWN STRL REUS W/ TWL LRG LVL3 (GOWN DISPOSABLE) ×1 IMPLANT
GOWN STRL REUS W/TWL 2XL LVL3 (GOWN DISPOSABLE) ×3 IMPLANT
GOWN STRL REUS W/TWL LRG LVL3 (GOWN DISPOSABLE) ×2
HEMOSTAT SURGICEL 2X14 (HEMOSTASIS) IMPLANT
KIT BASIN OR (CUSTOM PROCEDURE TRAY) ×3 IMPLANT
KIT TURNOVER KIT B (KITS) ×3 IMPLANT
MANIFOLD NEPTUNE WASTE (CANNULA) ×3 IMPLANT
NEEDLE BLUNT 16X1.5 OR ONLY (NEEDLE) ×3 IMPLANT
NEEDLE DENTAL 27 LONG (NEEDLE) ×6 IMPLANT
NS IRRIG 1000ML POUR BTL (IV SOLUTION) ×3 IMPLANT
PACK EENT II TURBAN DRAPE (CUSTOM PROCEDURE TRAY) ×3 IMPLANT
PAD ARMBOARD 7.5X6 YLW CONV (MISCELLANEOUS) ×6 IMPLANT
SPONGE SURGIFOAM ABS GEL 100 (HEMOSTASIS) ×3 IMPLANT
SPONGE SURGIFOAM ABS GEL 12-7 (HEMOSTASIS) IMPLANT
SPONGE SURGIFOAM ABS GEL SZ50 (HEMOSTASIS) IMPLANT
SUCTION FRAZIER HANDLE 10FR (MISCELLANEOUS) ×2
SUCTION TUBE FRAZIER 10FR DISP (MISCELLANEOUS) ×1 IMPLANT
SUT CHROMIC 3 0 PS 2 (SUTURE) ×9 IMPLANT
SUT CHROMIC 4 0 P 3 18 (SUTURE) IMPLANT
SYR 50ML SLIP (SYRINGE) ×3 IMPLANT
TOWEL NATURAL 10PK STERILE (DISPOSABLE) ×3 IMPLANT
TUBE CONNECTING 12'X1/4 (SUCTIONS) ×1
TUBE CONNECTING 12X1/4 (SUCTIONS) ×2 IMPLANT
WATER STERILE IRR 1000ML POUR (IV SOLUTION) ×3 IMPLANT
WATER TABLETS ICX (MISCELLANEOUS) ×3 IMPLANT
YANKAUER SUCT BULB TIP NO VENT (SUCTIONS) ×3 IMPLANT

## 2017-11-29 NOTE — Progress Notes (Signed)
Pt came to PACU with L AC PIV infiltrated-Dr [name] BlueSinger notified by CRNA Gillermina PhyMartinelli. Dr [name] BlueSinger updated re pt's c/o pain, and po OXY Solution given and she appears comfortable. Dr Kristin BruinsKulinski here & updated.

## 2017-11-29 NOTE — H&P (Signed)
11/29/2017  Patient:            Heather Day Date of Birth:  1985/08/07 MRN:                [id number]   BP 106/83   Pulse 76   Temp 97.9 F (36.6 C) (Oral)   Resp 20   Ht 5\' 8"  (1.727 m)   Wt (!) 142.7 kg   LMP 11/08/2017 (Approximate)   SpO2 99%   BMI 47.85 kg/m    Heather Day presents for multiple dental extractions with alveoloplasty and gross debridement remaining dentition in the operating room with general anesthesia.  The patient was recently diagnosed with severe tricuspid regurgitation with anticipated heart valve surgery in the future.  The patient denies any acute medical or dental changes.  Please see note from Dr. Cornelius Moras dated 10/30/2017 to use as H&P for the dental operating room procedure.    Charlynne Pander, DDS       CARDIOTHORACIC SURGERY CONSULTATION REPORT   Referring Provider is [name], MD  PCP is Default, Provider, MD    Chief Complaint   Patient presents with  . 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     The patient states that she was a heroin addict for approximately 10 years.  Approximately 3 years ago she was hospitalized with right-sided bacterial endocarditis complicated by severe tricuspid regurgitation and septic embolization to the lung.  She states that she was actually scheduled for tricuspid valve replacement but she left the hospital AMA.  At the time she lived in [place].  She was subsequently incarcerated in prison for a little over a year.  When she got out of prison she initially went back to using drugs but within 3 weeks made a decision to clean up her life.  She became completely  abstinent and moved to [place] to live with her cousin.  She claims to have remained drug-free ever since, now approximately 15 months.  She developed progressive symptoms of atypical chest pain, shortness of breath, and severe generalized edema.  She was initially evaluated by Dr. Bing Matter in December 2018, and she has been followed carefully ever since.  In April 2019 she developed acute appendicitis and underwent laparoscopic appendectomy by Dr. [name].  CT scan performed at that time revealed cholelithiasis.  She recovered uneventfully.  In May she was evaluated in the emergency room and diagnosed with biliary colic.  In June she was seen in follow-up by Dr. Bing Matter and notably complaining of worsened abdominal swelling and swelling in both arms and legs.  She has had atypical chest pain and palpitations.  Her weight had been trending up.  She underwent a Holter monitor that revealed only sinus rhythm with no suggestion of any sustained arrhythmias.  Follow-up echocardiogram revealed normal left ventricular systolic function with moderate right atrial enlargement and moderate to severe tricuspid regurgitation.  The tricuspid regurgitation reportedly looked worse than previous echocardiograms and there was question of a possible flail leaflet of the tricuspid valve.  TEE was performed October 03, 2017 and confirmed the presence of severe tricuspid regurgitation.  There was normal left ventricular systolic function with ejection fraction estimated 55 to  60%.  The aortic and mitral valves were normal.  The patient was subsequently referred for elective surgical consultation.     The patient is single and lives with her boyfriend in [place].  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  She complains of progressive exertional shortness of breath with swelling of both arms, both legs, and her abdomen.  Symptoms are transiently improved by diuretic therapy with Lasix but the patient  continues to struggle.  She denies any exertional chest pain or chest tightness.  She reports occasional fleeting tightness across her chest.  She reports occasional palpitations without dizzy spells or syncope.  She has continued to gain weight, currently weighing 300 pounds.  She reports intermittent episodes of sharp right upper quadrant abdominal pain typically developed 45 minutes to 1 hour after meals.  Symptoms are sometimes crampy in nature and usually subside.  She states that she weighed 160 pounds when she was in jail 3 years ago.  She maintains that she quit smoking last February and she has not used any type of illicit drug since she moved to [place] 15 months ago.  She had a negative drug screen at the time of her initial evaluation in November 2018.          Past Medical History:    Diagnosis   Date  . Drug abuse, IV (HCC)  . Gallstone  . Heartburn  . Hepatitis C  . Severe tricuspid regurgitation     Past Surgical History:  Procedure   Laterality   Date  . CESAREAN SECTION  . LAPAROSCOPIC APPENDECTOMY   N/A   07/04/2017   Procedure: APPENDECTOMY LAPAROSCOPIC;  Surgeon: Luretha Murphy, MD;  Location: [facility] OR[facility];  Service: General;  Laterality: N/A;   TEE WITHOUT CARDIOVERSION   N/A   10/03/2017    Procedure: TRANSESOPHAGEAL ECHOCARDIOGRAM (TEE);  Surgeon: Jodelle Red, MD;  Location: [facility] ENDOSCOPY;  Service: Cardiovascular;  Laterality: N/A;                Family History    Problem   Relation   Age of Onset    .   Hypothyroidism   Mother        .   Hypotension   Mother        .   Healthy   Father               Social History             Socioeconomic History    .   Marital status:   Single            Spouse name:   Not on file    .   Number of children:   Not on file    .   Years of education:   Not on file    .   Highest education level:   Not on file     Occupational History    .   Not on file    Social Needs    .   Financial resource strain:   Not on file    .   Food insecurity:            Worry:   Not on file            Inability:   Not on file    .   Transportation needs:  Medical:   Not on file            Non-medical:   Not on file    Tobacco Use    .   Smoking status:   Former Smoker            Packs/day:   1.00            Years:   20.00            Pack years:   20.00            Types:   Cigarettes    .   Smokeless tobacco:   Never Used    Substance and Sexual Activity    .   Alcohol use:   No    .   Drug use:   Not Currently            Types:   IV            Comment: Clean x 10 months from IV heroin , used 9 years     .   Sexual activity:   Not on file    Lifestyle    .   Physical activity:            Days per week:   Not on file            Minutes per session:   Not on file    .   Stress:   Not on file    Relationships    .   Social connections:            Talks on phone:   Not on file            Gets together:   Not on file            Attends religious service:   Not on file            Active member of club or organization:   Not on file            Attends meetings of clubs or organizations:   Not on file            Relationship status:   Not on file    .   Intimate partner violence:            Fear of current or ex partner:   Not on file            Emotionally abused:   Not on file            Physically abused:   Not on file            Forced sexual activity:   Not on file    Other Topics   Concern    .   Not on file    Social History Narrative    .   Not on file                 Current Outpatient Medications    Medication    Sig   Dispense   Refill    .   furosemide (LASIX) 40 MG tablet   Take 1 tablet (40 mg total) by mouth daily.   90 tablet   0    .   ibuprofen (ADVIL,MOTRIN) 200 MG tablet   Take 600 mg by mouth every 6 (six) hours as needed.            Marland Kitchen  omeprazole (PRILOSEC) 20 MG capsule   Take 1 capsule (20 mg total) by mouth 2 (two) times daily before a meal. (Patient taking differently: Take 20 mg by mouth 2 (two) times daily as needed (heartburn). )   60 capsule   11    .   potassium chloride (K-DUR) 10 MEQ tablet   Take 1 tablet (10 mEq total) by mouth daily.   30 tablet   2        No current facility-administered medications for this visit.           No Known Allergies           Review of Systems:                 General:                      decreased appetite, decreased energy, + weight gain, no weight loss, no fever              Cardiac:                       no chest pain with exertion, fleeting atypical chest pain at rest, +SOB with low level exertion, occasional resting SOB, no PND, + orthopnea, + palpitations, no arrhythmia, no atrial fibrillation, + LE edema, + dizzy spells, no syncope              Respiratory:                 + shortness of breath, no home oxygen, no productive cough, + dry cough, no bronchitis, + wheezing, no hemoptysis, no asthma, no pain with inspiration or cough, no sleep apnea, no CPAP at night              GI:                               no difficulty swallowing, no reflux, + frequent heartburn, no hiatal hernia, + abdominal pain, no constipation, no diarrhea, no hematochezia, no hematemesis, no melena              GU:                              no dysuria,  no frequency, no urinary tract infection, no hematuria, no kidney stones, no kidney disease              Vascular:                     no pain suggestive of claudication, no pain in feet, + leg cramps, + varicose veins, no DVT, no non-healing foot  ulcer              Neuro:                         no stroke, no TIA's, no seizures, no headaches, no temporary blindness one eye,  no slurred speech, no peripheral neuropathy, no chronic pain, no instability of gait, no memory/cognitive dysfunction              Musculoskeletal:         no arthritis, no joint swelling, no myalgias, no difficulty walking, normal mobility  Skin:                            no rash, no itching, no skin infections, no pressure sores or ulcerations              Psych:                         + anxiety, + depression, + nervousness, no unusual recent stress              Eyes:                           no blurry vision, no floaters, no recent vision changes, does not wears glasses or contacts              ENT:                            no hearing loss, no loose or painful teeth, no dentures, last saw dentist 3 years ago while in hospital              Hematologic:               no easy bruising, no abnormal bleeding, no clotting disorder, no frequent epistaxis              Endocrine:                   no diabetes, does not check CBG's at home                               Physical Exam:                 BP (!) 104/54   Pulse 74   Resp 20   Ht 5\' 8"  (1.727 m)   Wt 300 lb (136.1 kg)   LMP 10/09/2017   SpO2 98% Comment: RA  BMI 45.61 kg/m               General:                      Morbidly obese,  well-appearing              HEENT:                       Unremarkable               Neck:                           no JVD, no bruits, no adenopathy               Chest:                          clear to auscultation, symmetrical breath sounds, no wheezes, no rhonchi               CV:                              RRR, no  murmur  Abdomen:                    soft, non-tender, distended, no masses               Extremities:                 warm, well-perfused, pulses palpable, mild LE edema              Rectal/GU                    Deferred              Neuro:                         Grossly non-focal and symmetrical throughout              Skin:                            Clean and dry, no rashes, no breakdown        Diagnostic Tests:     Transthoracic Echocardiography     (Report amended )     Patient:    Maevis, Mumby  MR #:       [id number]  Study Date: 09/24/2017  Gender:     F  Age:        32  Height:     172.7 cm  Weight:     134.6 kg  BSA:        2.61 m^2  Pt. Status:  Room:      ATTENDING    Default, Provider [phone]   ORDERING     [name], MD   REFERRING    [name], MD   PERFORMING   [facility]   SONOGRAPHER  Sinda Du, RDCS     cc:     -------------------------------------------------------------------  LV EF: 55% -   60%     -------------------------------------------------------------------  History:   PMH:  Palpitations     Dizziness  History of endocarditis. Tricuspid valve regurgitation.  Endocarditis.     -------------------------------------------------------------------  Study Conclusions     - Left ventricle: The cavity size was normal. Systolic function was    normal. The estimated ejection fraction was in the range of 55%    to 60%. Wall motion was normal; there were no regional wall    motion abnormalities. Left ventricular diastolic function    parameters were normal.  - Right atrium: The atrium was mildly to moderately dilated.  - Tricuspid valve: There was moderate-severe regurgitation.     Impressions:     - Normal LVEF.    Moderate RA enlargement.    Mderate - severe TR.    Normal PAP.    TR is worst as compare to previous echo from 2018. Possibly flail    TV leaflet.     -------------------------------------------------------------------  Study data:  Comparison was made to the study of 03/09/2017.  Study  status:  Routine.  Procedure:  Transthoracic  echocardiography.  Image quality was adequate.  Study completion:  There were no  complications.          Transthoracic echocardiography.  M-mode,  complete 2D, spectral Doppler, and color Doppler.  Birthdate:  Patient birthdate: [birth year].  Age:  Patient is 32 yr old.  Sex:  Gender: female.    BMI: 45.1 kg/m^2.  Blood  pressure:     114/81  Patient status:  Outpatient.  Study date:  Study date: 09/24/2017.  Study time: 12:45 PM.  Location:  Echo laboratory.     -------------------------------------------------------------------     -------------------------------------------------------------------  Left ventricle:  The cavity size was normal. Systolic function was  normal. The estimated ejection fraction was in the range of 55% to  60%. Wall motion was normal; there were no regional wall motion  abnormalities. The transmitral flow pattern was normal. The  deceleration time of the early transmitral flow velocity was  normal. The pulmonary vein flow pattern was normal. The tissue  Doppler parameters were normal. Left ventricular diastolic function  parameters were normal.     -------------------------------------------------------------------  Aortic valve:   Structurally normal valve. Trileaflet; normal  thickness leaflets. Cusp separation was normal. Mobility was not  restricted.  Doppler:  Transvalvular velocity was within the normal  range. There was no stenosis. There was no regurgitation.     -------------------------------------------------------------------  Aorta:  Aortic root: The aortic root was normal in size.     -------------------------------------------------------------------  Mitral valve:   Structurally normal valve.   Mobility was not  restricted.  Doppler:  Transvalvular velocity was within the normal  range. There was no evidence for stenosis. There was no  regurgitation.      -------------------------------------------------------------------  Left atrium:  The atrium was normal in size.     -------------------------------------------------------------------  Right ventricle:  The cavity size was normal. Wall thickness was  normal. Systolic function was normal.     -------------------------------------------------------------------  Pulmonic valve:    Doppler:  Transvalvular velocity was within the  normal range. There was no evidence for stenosis. There was trivial  regurgitation.     -------------------------------------------------------------------  Tricuspid valve:   Structurally normal valve.    Doppler:  Transvalvular velocity was within the normal range. There was  moderate-severe regurgitation.     -------------------------------------------------------------------  Pulmonary artery:   The main pulmonary artery was normal-sized.  Systolic pressure was within the normal range.     -------------------------------------------------------------------  Right atrium:  The atrium was mildly to moderately dilated.     -------------------------------------------------------------------  Pericardium:  There was no pericardial effusion.     -------------------------------------------------------------------  Systemic veins:  Inferior vena cava: The vessel was normal in size.     -------------------------------------------------------------------  Measurements      Left ventricle                          Value        Reference   LV ID, ED, PLAX chordal         (H)     55.3  mm     43 - 52   LV ID, ES, PLAX chordal                 34    mm     23 - 38   LV fx shortening, PLAX chordal          39    %      >=29   LV PW thickness, ED                     8.16  mm     ----------   IVS/LV PW ratio, ED                     1.14         <=  1.3   Stroke volume, 2D                       58    ml     ----------    Stroke volume/bsa, 2D                   22    ml/m^2 ----------   LV e&', lateral                          13.4  cm/s   ----------   LV E/e&', lateral                        3.67         ----------   LV e&', medial                           11.5  cm/s   ----------   LV E/e&', medial                         4.28         ----------   LV e&', average                          12.45 cm/s   ----------   LV E/e&', average                        3.95         ----------      Ventricular septum                      Value        Reference   IVS thickness, ED                       9.28  mm     ----------      LVOT                                    Value        Reference   LVOT ID, S                              20    mm     ----------   LVOT area                               3.14  cm^2   ----------   LVOT peak velocity, S                   83.6  cm/s   ----------   LVOT mean velocity, S                   58    cm/s   ----------   LVOT VTI, S                             18.5  cm     ----------  Aorta                                   Value        Reference   Aortic root ID, ED                      28    mm     ----------   Ascending aorta ID, A-P, S              31    mm     ----------      Left atrium                             Value        Reference   LA ID, A-P, ES                          40    mm     ----------   LA ID/bsa, A-P                          1.53  cm/m^2 <=2.2   LA volume, S                            52.4  ml     ----------   LA volume/bsa, S                        20.1  ml/m^2 ----------   LA volume, ES, 1-p A4C                  47.7  ml     ----------   LA volume/bsa, ES, 1-p A4C              18.3  ml/m^2 ----------   LA volume, ES, 1-p A2C                  50.1  ml     ----------   LA volume/bsa, ES, 1-p A2C              19.2  ml/m^2 ----------      Mitral valve                            Value        Reference   Mitral E-wave peak  velocity             49.2  cm/s   ----------   Mitral A-wave peak velocity             33.1  cm/s   ----------   Mitral deceleration time        (H)     327   ms     150 - 230   Mitral E/A ratio, peak                  1.5          ----------      Pulmonary arteries                      Value  Reference   PA pressure, S, DP                      26    mm Hg  <=30      Tricuspid valve                         Value        Reference   Tricuspid regurg peak velocity          238   cm/s   ----------   Tricuspid peak RV-RA gradient           23    mm Hg  ----------      Right atrium                            Value        Reference   RA ID, S-I, ES, A4C             (H)     62.4  mm     34 - 49   RA area, ES, A4C                (H)     26.2  cm^2   8.3 - 19.5   RA volume, ES, A/L                      91.4  ml     ----------   RA volume/bsa, ES, A/L                  35    ml/m^2 ----------      Systemic veins                          Value        Reference   Estimated CVP                           3     mm Hg  ----------      Right ventricle                         Value        Reference   RV ID, minor axis, ED, A4C base         52.2  mm     ----------   TAPSE                                   27.7  mm     ----------   RV pressure, S, DP                      26    mm Hg  <=30   RV s&', lateral, S                       14    cm/s   ----------     Legend:  (L)  and  (H)  mark values outside specified reference range.     -------------------------------------------------------------------  Lora HavensAmended     Robert Krasowski, MD  [id number]:48:34  Transesophageal Echocardiography     Patient:    Lilliauna, Van  MR #:       [id number]  Study Date: 10/03/2017  Gender:     F  Age:        32  Height:     172.7 cm  Weight:     134.3 kg  BSA:        2.61 m^2  Pt. Status:  Room:      SONOGRAPHER  Perley Jain, RDCS   ORDERING      Cristal Deer, Bridgette   PERFORMING   Cristal Deer, Bridgette     cc:     -------------------------------------------------------------------  LV EF: 55% -   60%     -------------------------------------------------------------------  Indications:      Tricuspid regurgitation.     -------------------------------------------------------------------  Study Conclusions     - Left ventricle: Systolic function was normal. The estimated    ejection fraction was in the range of 55% to 60%. Wall motion was    normal; there were no regional wall motion abnormalities.  - Aortic valve: There was no regurgitation.  - Mitral valve: There was trivial regurgitation.  - Left atrium: No evidence of thrombus in the atrial cavity or    appendage.  - Right atrium: No evidence of thrombus in the atrial cavity or    appendage.  - Atrial septum: No defect or patent foramen ovale was identified.  - Tricuspid valve: Tricuspid valve in 3D showed apparent partial    destruction of posterior leaflet leading to malcoaptation (See    image 80) There was severe regurgitation directed eccentrically    and toward the septum. Peak RV-RA gradient (S): 30 mm Hg.  - Pulmonic valve: There was trivial regurgitation.     Impressions:     - Severe eccentric TR, initially appeared as malcoaptation but on    3D imaging, the posterior leaflet appears to be partially    destroyed. No evidence of current vegetation. No other    significant valvular abnormalities.     -------------------------------------------------------------------  Study data:   Study status:  Routine.  Consent:  The risks,  benefits, and alternatives to the procedure were explained to the  patient and informed consent was obtained.  Procedure:  The patient  reported no pain pre or post test. Initial setup. The patient was  brought to the laboratory. Surface ECG leads were monitored.  Sedation. Conscious  sedation was administered by anesthesiology  staff. Transesophageal echocardiography. An adult multiplane  transesophageal probe was inserted by the attending  cardiologistwithout difficulty. Image quality was adequate.  Study  completion:  The patient tolerated the procedure well. There were  no complications.  Administered medications:   Propofol.  Diagnostic transesophageal echocardiography.  2D and color Doppler.   Birthdate:  Patient birthdate: 1985-05-08.  Age:  Patient is 32 yr  old.  Sex:  Gender: female.    BMI: 45 kg/m^2.  Blood pressure:  110/74  Patient status:  Outpatient.  Study date:  Study date:  10/03/2017. Study time: 09:25 AM.  Location:  Endoscopy.     -------------------------------------------------------------------     -------------------------------------------------------------------  Left ventricle:  Systolic function was normal. The estimated  ejection fraction was in the range of 55% to 60%. Wall motion was  normal; there were no regional wall motion abnormalities.     -------------------------------------------------------------------  Aortic valve:   Structurally normal valve. Trileaflet; normal  thickness leaflets. Cusp separation was normal.  No evidence of  vegetation.  Doppler:  There was no regurgitation.     -------------------------------------------------------------------  Aorta:  There was no atheroma. There was no evidence for  dissection. Aortic root: The aortic root was not dilated.  Ascending aorta: The ascending aorta was normal in size.  Aortic arch: The aortic arch was normal in size.     -------------------------------------------------------------------  Mitral valve:   Structurally normal valve.   Leaflet separation was  normal.  No evidence of vegetation.  Doppler:  There was trivial  regurgitation.     -------------------------------------------------------------------  Left atrium:  The  atrium was normal in size.  No evidence of  thrombus in the atrial cavity or appendage. The appendage was  morphologically a left appendage, multilobulated, and of normal  size. Emptying velocity was normal.     -------------------------------------------------------------------  Atrial septum:  No defect or patent foramen ovale was identified.     -------------------------------------------------------------------  Right ventricle:  The cavity size was normal. Wall thickness was  normal. Systolic function was normal.     -------------------------------------------------------------------  Pulmonic valve:    Structurally normal valve.   Cusp separation was  normal.  No evidence of vegetation.  Doppler:  There was trivial  regurgitation.     -------------------------------------------------------------------  Tricuspid valve:   Doppler:  Tricuspid valve in 3D showed apparent  partial destruction of posterior leaflet leading to malcoaptation  (See image 80) There was severe regurgitation directed  eccentrically and toward the septum.     -------------------------------------------------------------------  Right atrium:  The atrium was normal in size.  No evidence of  thrombus in the atrial cavity or appendage. The appendage was  morphologically a right appendage.     -------------------------------------------------------------------  Pericardium:  There was no pericardial effusion.     -------------------------------------------------------------------  Measurements      Tricuspid valve                      Value   Tricuspid regurg peak velocity       274   cm/s   Tricuspid peak RV-RA gradient        30    mm Hg     Legend:  (L)  and  (H)  mark values outside specified reference range.     -------------------------------------------------------------------  Prepared and Electronically Authenticated by     Jodelle Red  2019-07-24T15:15:27            HOLTER MONITOR REPORT:           Date of test:                 09/24/2017  Duration of test:           48 hours  Indication:                    Palpitations  Ordering physician:         [name] MD  Referring physician:        [name] MD        Baseline rhythm: Normal sinus rhythm     Minimum heart rate: 56 BPM.  Average heart rate: 85BPM.  Maximal heart rate 149 BPM.     Atrial arrhythmia: 2 premature supraventricular beats     Ventricular arrhythmia: 1 premature ventricular beat     Conduction abnormality: None     Symptoms: None        Conclusion:   Normal Holter monitor.     Interpreting  cardiologist: Molly Maduro  Bing Matter, MD   Date: 10/05/2017 1:18 PM                Impression:     Patient has stage D severe symptomatic tricuspid regurgitation.  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  She claims to have remained abstinent from all illicit drug use for the past 15 months.  She continues to experience symptoms of right-sided heart failure with severe swelling, volume retention, abdominal bloating, and exertional shortness of breath.  She also describes episodic right upper quadrant abdominal pain that is typically postprandial and consistent with biliary colic.  I have personally reviewed the patient's recent transthoracic and transesophageal echocardiograms.  Echocardiograms reveal severe tricuspid regurgitation with what appears to be perforation of the posterior leaflet of the tricuspid valve.  The right ventricle is dilated.  The right atrium is dilated.  Left ventricular size and systolic function appears normal and all other valves are normal.  Previous abdominal CT scan documented the presence of gallstones.  The patient has poor dentition.     I agree that it would be reasonable to consider  elective tricuspid valve repair or replacement.  Based upon review of the patient's recent TEE I feel there is a significant chance that her valve may be repairable.  She is clearly symptomatic and without some type of surgical intervention she will likely develop progressive right heart dysfunction eventually over time.  The patient gives a convincing history that she has remained abstinent from any type of illicit drug use.  However, there is no urgency associated with the need for surgical intervention as her severe tricuspid regurgitation has unquestionably been present since she developed endocarditis approximately 3 years ago.  The patient probably should have her gallbladder removed prior to surgery.  She definitely needs dental service consultation and probably will require dental extraction.        Plan:     I have discussed the nature of the patient's clinical problems and personally reviewed images from her recent TEE with the patient in the office today.  We discussed the indications, risk, and potential benefits of tricuspid valve repair or replacement.  Long-term prognosis with continued medical therapy without intervention was discussed.  Alternative surgical approaches have been discussed including a comparison between conventional sternotomy and minimally-invasive techniques.  Because of the patient's morbid obesity I do not feel that she would likely be good candidate for minithoracotomy approach.  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  Timing of surgery was discussed including the need to eliminate any potential source for infection.  The rationale for proceeding with elective cholecystectomy in the setting of symptomatic cholelithiasis was discussed.  The need for dental extraction was discussed.  All of her questions been addressed.     Patient  will be referred to Dr. [name] and/or his colleagues at [facility] to consider elective cholecystectomy.  The patient will also be referred for dental service consultation.  She will call our office and return for follow-up when she has recovered from these procedures so that we can make further arrangements for surgery.  At some point prior to surgery we will obtain a cardiac gated CT angiogram of the heart to evaluate her coronary anatomy.        I spent in excess of 90 minutes during  the conduct of this office consultation and >50% of this time involved direct face-to-face encounter with the patient for counseling and/or coordination of their care.           Salvatore Decent. Cornelius Moras, MD  10/30/2017  2:20 PM              Electronically signed by Purcell Nails, MD at 10/30/2017  6:20 PM

## 2017-11-29 NOTE — Op Note (Signed)
OPERATIVE REPORT  Patient:            Heather Day Date of Birth:  Jul 15, 1985 MRN:                [id number]   DATE OF PROCEDURE:  11/29/2017  PREOPERATIVE DIAGNOSES: 1.  Severe tricuspid regurgitation 2.  Pre-heart valve surgery dental protocol 3.  Chronic apical periodontitis 4.  Dental caries 5.  Retained root segments 6.  Chronic periodontitis 7.  Accretions  POSTOPERATIVE DIAGNOSES: 1.  Severe tricuspid regurgitation 2.  Pre-heart valve surgery dental protocol 3.  Chronic apical periodontitis 4.  Dental caries 5.  Retained root segments 6.  Chronic periodontitis 7.  Accretions  OPERATIONS: 1. Multiple extraction of tooth numbers 2, 5 through 15, and 31 2.  3 quadrants of alveoloplasty 3. Gross debridement of remaining dentition   SURGEON: Charlynne Pander, DDS  ASSISTANT: Pearletha Alfred (dental assistant)  ANESTHESIA: General anesthesia via nasoendotracheal tube.  MEDICATIONS: 1. Ancef 2 g IV prior to invasive dental procedures. 2. Local anesthesia with a total utilization of 6 carpules each containing 34 mg of lidocaine with 0.017 mg of epinephrine as well as 1 carpule each containing 9 mg of bupivacaine with 0.009 mg of epinephrine.  SPECIMENS: There are 13 teeth that were discarded.  DRAINS: None  CULTURES: None  COMPLICATIONS: None  ESTIMATED BLOOD LOSS: 100 mLs.  INTRAVENOUS FLUIDS: 1000 mLs of Lactated ringers solution.  INDICATIONS: The patient was recently diagnosed with severe tricuspid regurgitation.  A medically necessary dental consultation was then requested to evaluate poor dentition as part of a pre-heart valve surgery dental protocol.  The patient was examined and treatment planned for multiple extractions with alveoloplasty and gross debridement of remaining dentition in the operating room with general anesthesia.  This treatment plan was formulated to decrease the risks and complications associated with dental infection from affecting  the patient's systemic health and the anticipated heart valve surgery.  OPERATIVE FINDINGS: Patient was examined operating room number 10.  The teeth were identified for extraction. The patient was noted be affected by chronic apical periodontitis, retained root segments, dental caries, chronic periodontitis, and Accretions.     DESCRIPTION OF PROCEDURE: Patient was brought to the main operating room number 10. Patient was then placed in the supine position on the operating table. General anesthesia was then induced per the anesthesia team. The patient was then prepped and draped in the usual manner for dental medicine procedure. A timeout was performed. The patient was identified and procedures were verified. A throat pack was placed at this time. The oral cavity was then thoroughly examined with the findings noted above. The patient was then ready for dental medicine procedure as follows:  Local anesthesia was then administered sequentially with a total utilization of 6 carpules each containing 34 mg of lidocaine with 0.017 mg of epinephrine as well as 1 carpule  each containing 9 mg bupivacaine with 0.009 mg of epinephrine.  The Maxillary left and right quadrants first approached. Anesthesia was then delivered utilizing infiltration with lidocaine with epinephrine. A #15 blade incision was then made from the maxillary right tuberosity and extended to the maxillary left tuberosity.  A  surgical flap was then carefully reflected. The maxillary teeth were then subluxated with a series of straight elevators.  A surgical handpiece and bur and copious amounts of sterile water were used to remove buccal and interseptal bone around tooth numbers 2, 5, 6, 11, 12, 13, 14, and 15 appropriately.  The maxillary teeth were then again subluxated with a series of straight elevators.  Tooth numbers 5 through 13 were then removed with a 150 forceps without complications.  Tooth #2 was then removed with a 53R forceps  without complications.  Tooth numbers 14 and 15 were then removed with the 53 L forceps without complications.  Alveoloplasty was then performed utilizing a ronguers and bone file to achieve primary closure. The surgical sites were then irrigated with copious amounts of sterile saline x4. The tissues were approximated and trimmed appropriately.  A piece of Surgifoam was placed in each extraction socket appropriately.  The maxillary right surgical site was then closed from the maxillary right tuberosity and extended the mesial #8 utilizing 3-0 chromic gut suture in a continuous interrupted suture technique x1.  The maxillary left surgical site was then closed from the maxillary left tuberosity and extended to the mesial #9 utilizing 3-0 chromic gut suture in a continuous interrupted suture technique x1.    At this point time, the mandibular quadrants were approached. The patient was given a  inferior alveolar nerve block and long buccal nerve block utilizing the bupivacaine with epinephrine to the mandibular right quadrant. Further infiltration was then achieved utilizing the lidocaine with epinephrine. A 15 blade incision was then made from the distal of number 32 and extended to the mesial of #29.  A surgical flap was then carefully reflected.  A surgical handpiece and but with copious amounts of sterile water were used to remove buccal and interseptal bone around tooth #31.  The coronal aspect of tooth #31 was then removed with a 17 forceps.  Further bone was then removed around the retained root segments and the roots were then elevated out with a series of Cryer's elevators without further complication.  Alveoloplasty was then performed utilizing a rongeurs and bone file to help achieve primary closure. The tissues were approximated and trimmed appropriately. The surgical sites were then irrigated with copious amounts of sterile saline.  A piece of Surgifoam was placed in the extraction socket appropriately.   The mandibular right surgical site was then closed from the distal #32 and extended the mesial of #29 utilizing 3-0 chromic gut suture in a continuous rapid suture technique x1.   At this point in time, the remaining dentition was approached.  A sonic scaler was used to remove accretions.  A series of hand curettes were then used to further remove accretions.  The Sonic scaler was then again used to refine the removal of accretions as needed.  This completed the gross debridement procedure.  At this point time, the entire mouth was irrigated with copious amounts of sterile saline. The patient was examined for complications, seeing none, the dental medicine procedure was deemed to be complete. The throat pack was removed at this time. An oral airway was then placed at the request of the anesthesia team. A series of 4 x 4 gauze were placed in the mouth to aid hemostasis. The patient was then handed over to the anesthesia team for final disposition. After an appropriate amount of time, the patient was extubated and taken to the postanesthsia care unit in good condition. All counts were correct for the dental medicine procedure.   Charlynne Panderonald F. Hellen Shanley, DDS.

## 2017-11-29 NOTE — Progress Notes (Signed)
PRE-OPERATIVE NOTE:  11/29/2017 Heather LotAshly Day [id number]  VITALS: BP 106/83   Pulse 76   Temp 97.9 F (36.6 C) (Oral)   Resp 20   Ht 5\' 8"  (1.727 m)   Wt (!) 142.7 kg   LMP 11/08/2017 (Approximate)   SpO2 99%   BMI 47.85 kg/m   Lab Results  Component Value Date   WBC 8.8 11/28/2017   HGB 13.4 11/28/2017   HCT 41.0 11/28/2017   MCV 90.3 11/28/2017   PLT 281 11/28/2017   BMET    Component Value Date/Time   NA 137 11/28/2017 0950   NA 138 10/16/2017 1600   K 4.0 11/28/2017 0950   CL 103 11/28/2017 0950   CO2 25 11/28/2017 0950   GLUCOSE 115 (H) 11/28/2017 0950   BUN 14 11/28/2017 0950   BUN 19 10/16/2017 1600   CREATININE 0.82 11/28/2017 0950   CALCIUM 9.1 11/28/2017 0950   GFRNONAA >60 11/28/2017 0950   GFRAA >60 11/28/2017 0950    Lab Results  Component Value Date   INR 0.86 11/28/2017   No results found for: PTT   Heather Day presents for multiple dental extractions with alveoloplasty and gross debridement of remaining dentition in the operating room with general anesthesia.   SUBJECTIVE: The patient denies any acute medical or dental changes and agrees to proceed with treatment as planned.  EXAM: No sign of acute dental changes.  ASSESSMENT: Patient is affected by chronic apical periodontitis, dental caries, retained root segments, chronic periodontitis, and Accretions.   PLAN: Patient agrees to proceed with treatment as planned in the operating room as previously discussed and accepts the risks, benefits, and complications of the proposed treatment. Patient is aware of the risk for bleeding, bruising, swelling, infection, pain, nerve damage, soft tissue damage, damage to adjacent teeth, sinus involvement, root tip fracture, mandible fracture, and the risks of complications associated with the anesthesia. Patient also is aware of the potential for other complications up to and including death due to her overall cardiovascular and respiratory compromise.      Charlynne Panderonald F. Kulinski, DDS

## 2017-11-29 NOTE — Discharge Instructions (Signed)

## 2017-11-29 NOTE — Transfer of Care (Signed)
Immediate Anesthesia Transfer of Care Note  Patient: Heather Day  Procedure(s) Performed: Extraction of tooth #'s 2,5-15, and 31 with alveoloplasty and gross debridement of remaining dentition (Bilateral )  Patient Location: PACU  Anesthesia Type:General  Level of Consciousness: awake, alert  and oriented  Airway & Oxygen Therapy: Patient Spontanous Breathing and Patient connected to face mask oxygen  Post-op Assessment: Report given to RN, Post -op Vital signs reviewed and stable and Patient moving all extremities X 4  Post vital signs: Reviewed and stable  Last Vitals:  Vitals Value Taken Time  BP 133/85 11/29/2017 10:13 AM  Temp    Pulse 95 11/29/2017 10:13 AM  Resp 16 11/29/2017 10:13 AM  SpO2 100 % 11/29/2017 10:13 AM  Vitals shown include unvalidated device data.  Last Pain:  Vitals:   11/29/17 [id number]  TempSrc:   PainSc: 0-No pain         Complications: No apparent anesthesia complications

## 2017-11-29 NOTE — Anesthesia Postprocedure Evaluation (Signed)
Anesthesia Post Note  Patient: Heather Day  Procedure(s) Performed: Extraction of tooth #'s 2,5-15, and 31 with alveoloplasty and gross debridement of remaining dentition (Bilateral )     Patient location during evaluation: PACU Anesthesia Type: General Level of consciousness: sedated Pain management: pain level controlled Vital Signs Assessment: post-procedure vital signs reviewed and stable Respiratory status: spontaneous breathing and respiratory function stable Cardiovascular status: stable Postop Assessment: no apparent nausea or vomiting Anesthetic complications: no    Last Vitals:  Vitals:   11/29/17 1045 11/29/17 1050  BP: 135/82 122/77  Pulse:  87  Resp: 20   Temp: (!) 36.1 C   SpO2:  98%    Last Pain:  Vitals:   11/29/17 1050  TempSrc:   PainSc: 4                  Korry Dalgleish DANIEL

## 2017-11-29 NOTE — Anesthesia Procedure Notes (Signed)
Procedure Name: Intubation Date/Time: 11/29/2017 8:09 AM Performed by: Neldon Newport, CRNA Pre-anesthesia Checklist: Timeout performed, Patient being monitored, Suction available, Emergency Drugs available and Patient identified Patient Re-evaluated:Patient Re-evaluated prior to induction Oxygen Delivery Method: Circle system utilized Preoxygenation: Pre-oxygenation with 100% oxygen Induction Type: IV induction Ventilation: Mask ventilation without difficulty Laryngoscope Size: Mac and 3 Grade View: Grade I Nasal Tubes: Right, Nasal prep performed, Nasal Rae and Magill forceps- large, utilized Tube size: 6.5 mm Number of attempts: 1 Placement Confirmation: breath sounds checked- equal and bilateral,  positive ETCO2 and ETT inserted through vocal cords under direct vision Tube secured with: Tape Dental Injury: Teeth and Oropharynx as per pre-operative assessment

## 2017-11-30 ENCOUNTER — Encounter (HOSPITAL_COMMUNITY): Payer: Self-pay | Admitting: Dentistry

## 2017-12-06 ENCOUNTER — Encounter: Payer: Self-pay | Admitting: Cardiology

## 2017-12-06 ENCOUNTER — Ambulatory Visit (INDEPENDENT_AMBULATORY_CARE_PROVIDER_SITE_OTHER): Payer: Self-pay | Admitting: Cardiology

## 2017-12-06 VITALS — BP 110/70 | HR 104 | Ht 68.0 in | Wt 314.8 lb

## 2017-12-06 DIAGNOSIS — F1911 Other psychoactive substance abuse, in remission: Secondary | ICD-10-CM

## 2017-12-06 DIAGNOSIS — I071 Rheumatic tricuspid insufficiency: Secondary | ICD-10-CM

## 2017-12-06 DIAGNOSIS — Z87898 Personal history of other specified conditions: Secondary | ICD-10-CM

## 2017-12-06 DIAGNOSIS — R002 Palpitations: Secondary | ICD-10-CM

## 2017-12-06 MED ORDER — FUROSEMIDE 80 MG PO TABS: 40.0000 mg | ORAL_TABLET | Freq: Every day | ORAL | 1 refills | Status: DC

## 2017-12-06 NOTE — Progress Notes (Signed)
Cardiology Office Note:    Date:  12/06/2017   ID:  Heather Day, DOB 19-Apr-1985, MRN [id number]  PCP:  Patient, No Pcp Per  Cardiologist:  [name], MD    Referring MD: No ref. provider found   Chief Complaint  Patient presents with  . 1 month follow up  Doing fine  History of Present Illness:    Heather Day is a 32 y.o. 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  She is clean now she was sent to cardiothoracic surgery with consideration for tricuspid valve repair versus replacement.  She was not qualified as potential candidate however she need to have her dental care improved before surgery as well as gallbladder surgery before.  She had dental surgery done couple days ago still sore however doing well from that point review she does have appointment with surgeon a month from now to talk about cholecystectomy.  Overall doing well upset about the fact that her weight did not go down.  Also upset about the fact that she cannot lose weight.  She did not take her furosemide for 2 days, as she said she ran out of it and she did have moderate right now she does have some money she will buy it.  Past Medical History:  Diagnosis Date  . Drug abuse, IV (HCC)   . Gallstone   . Heartburn   . Hepatitis C   . Periodontitis chronic, apical    dental caries  . Severe tricuspid regurgitation   . Wears glasses     Past Surgical History:  Procedure Laterality Date  . CESAREAN SECTION    . LAPAROSCOPIC APPENDECTOMY N/A 07/04/2017   Procedure: APPENDECTOMY LAPAROSCOPIC;  Surgeon: Luretha Murphy, MD;  Location: [facility];  Service: General;  Laterality: N/A;  . MULTIPLE EXTRACTIONS WITH ALVEOLOPLASTY Bilateral 11/29/2017   Procedure: Extraction of tooth #'s 2,5-15, and 31 with alveoloplasty and gross debridement of remaining dentition;  Surgeon: Charlynne Pander, DDS;  Location: [facility] OR;  Service: Oral Surgery;   Laterality: Bilateral;  . TEE WITHOUT CARDIOVERSION N/A 10/03/2017   Procedure: TRANSESOPHAGEAL ECHOCARDIOGRAM (TEE);  Surgeon: Jodelle Red, MD;  Location: [facility] ENDOSCOPY;  Service: Cardiovascular;  Laterality: N/A;    Current Medications: Current Meds  Medication Sig  . furosemide (LASIX) 40 MG tablet Take 1 tablet (40 mg total) by mouth daily.  Marland Kitchen ibuprofen (ADVIL,MOTRIN) 200 MG tablet Take 600 mg by mouth every 6 (six) hours as needed (for pain.).   Marland Kitchen omeprazole (PRILOSEC) 20 MG capsule Take 1 capsule (20 mg total) by mouth 2 (two) times daily before a meal. (Patient taking differently: Take 20 mg by mouth 2 (two) times daily. )  . oxyCODONE-acetaminophen (PERCOCET) 5-325 MG tablet Take one or two tablets by mouth every 6 hours as needed for moderate to severe dental           pain.  . potassium chloride (K-DUR,KLOR-CON) 10 MEQ tablet Take 10 mEq by mouth daily.  . promethazine (PHENERGAN) 25 MG tablet Take 1 tablet (25 mg total) by mouth every 6 (six) hours as needed for nausea or vomiting.     Allergies:   Patient has no known allergies.   Social History   Socioeconomic History  . Marital status: Single    Spouse name: Not on file  . Number of children: Not on file  . Years of education: Not on file  . Highest education level: Not  on file  Occupational History  . Not on file  Social Needs  . Financial resource strain: Not on file  . Food insecurity:    Worry: Not on file    Inability: Not on file  . Transportation needs:    Medical: Not on file    Non-medical: Not on file  Tobacco Use  . Smoking status: Former Smoker    Packs/day: 1.00    Years: 20.00    Pack years: 20.00    Types: Cigarettes    Last attempt to quit: 04/26/2017    Years since quitting: 0.6  . Smokeless tobacco: Never Used  Substance and Sexual Activity  . Alcohol use: No  . 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 Sexual  activity: Not on file  Lifestyle  . Physical activity:    Days per week: Not on file    Minutes per session: Not on file  . Stress: Not on file  Relationships  . Social connections:    Talks on phone: Not on file    Gets together: Not on file    Attends religious service: Not on file    Active member of club or organization: Not on file    Attends meetings of clubs or organizations: Not on file    Relationship status: Not on file  Other Topics Concern  . Not on file  Social History Narrative  . Not on file     Family History: The patient's family history includes Healthy in her father; Hypotension in her mother; Hypothyroidism in her mother. ROS:   Please see the history of present illness.    All 14 point review of systems negative except as described per history of present illness  EKGs/Labs/Other Studies Reviewed:      Recent Labs: 03/14/2017: B Natriuretic Peptide 34.9 09/04/2017: NT-Pro BNP 242 11/28/2017: ALT 20; BUN 14; Creatinine, Ser 0.82; Hemoglobin 13.4; Platelets 281; Potassium 4.0; Sodium 137  Recent Lipid Panel    Component Value Date/Time   CHOL 214 (H) 02/26/2017 1425   TRIG 184 (H) 02/26/2017 1425   HDL 45 02/26/2017 1425   CHOLHDL 4.8 (H) 02/26/2017 1425   LDLCALC 132 (H) 02/26/2017 1425    Physical Exam:    VS:  BP 110/70   Pulse (!) 104   Ht 5\' 8"  (1.727 m)   Wt (!) 314 lb 12.8 oz (142.8 kg)   LMP 11/08/2017 (Approximate)   SpO2 97%   BMI 47.87 kg/m     Wt Readings from Last 3 Encounters:  12/06/17 (!) 314 lb 12.8 oz (142.8 kg)  11/29/17 (!) 314 lb 11.2 oz (142.7 kg)  11/28/17 (!) 314 lb 11.2 oz (142.7 kg)     GEN:  Well nourished, well developed in no acute distress HEENT: Normal NECK: No JVD; No carotid bruits LYMPHATICS: No lymphadenopathy CARDIAC: RRR, systolic murmur like always, no rubs, no gallops RESPIRATORY:  Clear to auscultation without rales, wheezing or rhonchi  ABDOMEN: Soft, non-tender, non-distended MUSCULOSKELETAL:   No edema; No deformity  SKIN: Warm and dry LOWER EXTREMITIES: no swelling NEUROLOGIC:  Alert and oriented x 3 PSYCHIATRIC:  Normal affect   ASSESSMENT:    1. Severe tricuspid regurgitation   2. History of drug abuse   3. Palpitations    PLAN:    In order of problems listed above:  1. Severe tricuspid regurgitation.  Again  she was evaluated by surgery and she is being qualified for surgery.  We have to take care of her dental care as well as gallbladder that were waiting for hemodynamically stable will restart her furosemide. 2. History of drug abuse she remains clean. 3. Palpitations denies having any.   Medication Adjustments/Labs and Tests Ordered: Current medicines are reviewed at length with the patient today.  Concerns regarding medicines are outlined above.  No orders of the defined types were placed in this encounter.  Medication changes: No orders of the defined types were placed in this encounter.   Signed, [name], MD, [facility] 12/06/2017 3:31 PM    [facility]

## 2017-12-06 NOTE — Patient Instructions (Signed)
Medication Instructions:  Your physician has recommended you make the following change in your medication:  START 40 mg lasix daily  Labwork: None  Testing/Procedures: None  Follow-Up: Your physician recommends that you schedule a follow-up appointment in: 3 months  Any Other Special Instructions Will Be Listed Below (If Applicable).     If you need a refill on your cardiac medications before your next appointment, please call your pharmacy.   [facility]  Garey Ham, RN, BSN

## 2017-12-07 MED FILL — POTASSIUM CHL ER M10 TABLET: 10 | 30 days supply | Qty: 30 | Fill #2

## 2017-12-07 MED FILL — OMEPRAZOLE 20 MG CPDR: 20 | 30 days supply | Qty: 60 | Fill #3

## 2017-12-11 ENCOUNTER — Ambulatory Visit (HOSPITAL_COMMUNITY): Payer: Self-pay | Admitting: Dentistry

## 2017-12-21 MED FILL — OMEPRAZOLE 20 MG CPDR: 20 | 30 days supply | Qty: 60 | Fill #3

## 2017-12-21 MED FILL — POTASSIUM CHL ER M10 TABLET: 10 | 30 days supply | Qty: 30 | Fill #2

## 2017-12-24 ENCOUNTER — Ambulatory Visit (HOSPITAL_COMMUNITY): Payer: Self-pay | Admitting: Dentistry

## 2017-12-24 ENCOUNTER — Encounter (HOSPITAL_COMMUNITY): Payer: Self-pay | Admitting: Dentistry

## 2017-12-24 VITALS — BP 113/86 | HR 79 | Temp 97.9°F

## 2017-12-24 DIAGNOSIS — Z01818 Encounter for other preprocedural examination: Secondary | ICD-10-CM

## 2017-12-24 DIAGNOSIS — K08199 Complete loss of teeth due to other specified cause, unspecified class: Secondary | ICD-10-CM

## 2017-12-24 DIAGNOSIS — Z8679 Personal history of other diseases of the circulatory system: Secondary | ICD-10-CM

## 2017-12-24 DIAGNOSIS — I071 Rheumatic tricuspid insufficiency: Secondary | ICD-10-CM

## 2017-12-24 DIAGNOSIS — K029 Dental caries, unspecified: Secondary | ICD-10-CM

## 2017-12-24 NOTE — Patient Instructions (Signed)
PLAN: 1. Use salt water rinses as needed to aid healing. She is to use Monoject syringe to aid in removing food debris from extraction site area #31. 2. Brush teeth after meals and at bedtime 3. Follow-up with a dentist of her choice for additional dental restorations and fabrication of upper complete and lower partial  4. The patient was given preoperative models to assist in fabrication of her dentures. 5. Release of information was signed for [employer] to be able to send dental records if they so desire. 6. Patient is cleared Dentally for tricuspid valve surgery at this time.     Charlynne Pander, DDS

## 2017-12-24 NOTE — Progress Notes (Signed)
POST OPERATIVE NOTE:  12/24/2017 Heather Day [id number]  VITALS: BP 113/86 (BP Location: Left Arm)   Pulse 79   Temp 97.9 F (36.6 C)   LABS:  Lab Results  Component Value Date   WBC 8.8 11/28/2017   HGB 13.4 11/28/2017   HCT 41.0 11/28/2017   MCV 90.3 11/28/2017   PLT 281 11/28/2017   BMET    Component Value Date/Time   NA 137 11/28/2017 0950   NA 138 10/16/2017 1600   K 4.0 11/28/2017 0950   CL 103 11/28/2017 0950   CO2 25 11/28/2017 0950   GLUCOSE 115 (H) 11/28/2017 0950   BUN 14 11/28/2017 0950   BUN 19 10/16/2017 1600   CREATININE 0.82 11/28/2017 0950   CALCIUM 9.1 11/28/2017 0950   GFRNONAA >60 11/28/2017 0950   GFRAA >60 11/28/2017 0950    Lab Results  Component Value Date   INR 0.86 11/28/2017   No results found for: PTT   Heather Day is status post multiple extractions with alveoloplasty and gross debridement of remaining dentition in the operating room with general anesthesia on 11/29/2017. The patient now presents for evaluation of healing and suture removal as needed.  SUBJECTIVE: Patient with no complaints of pain from dental extraction sites. Patient denies having any stitches that remain.   EXAM: There is no sign of infection, heme, or ooze. Patient is healing in by generalized primary closure with several areas of healing in by secondary intention. Patient is missing all maxillary teeth.  Dental caries as per previous dental charting form on tooth numbers 20, 21, and 22. Incisal caries of tooth #27 noted.  PROCEDURE: The patient was given a chlorhexidine gluconate rinse for 30 seconds. No sutures need to be removed.  Food debris was removed from the lower right extraction site with irrigation. The patient tolerated procedure well.  Patient was given a Monoject syringe and instructions to aid irrigating the lower right molar extraction site of food debris.  ASSESSMENT: Post operative course is consistent with dental procedures performed in the  operating room Loss of teeth due to extraction Dental caries of tooth numbers 20, 21, 22 and the incisal of #27.  PLAN: 1. Use salt water rinses as needed to aid healing. She is to use Monoject syringe to aid in removing food debris from extraction site area #31. 2. Brush teeth after meals and at bedtime 3. Follow-up with a dentist of her choice for additional dental restorations and fabrication of upper complete and lower partial  4. The patient was given preoperative models to assist in fabrication of her dentures. 5. Release of information was signed for [employer] to be able to send dental records if they so desire. 6. Patient is cleared Dentally for tricuspid valve surgery at this time.    Charlynne Pander, DDS

## 2017-12-31 DIAGNOSIS — Z736 Limitation of activities due to disability: Secondary | ICD-10-CM

## 2018-01-02 ENCOUNTER — Telehealth: Payer: Self-pay

## 2018-01-02 NOTE — Telephone Encounter (Signed)
Patient has contacted the office stating that she has been having worsening edema. She stated that she noticed the edema has gotten worse in her legs and in her stomach. She has been having some left side pain that radiates to her axillary, but denies numbness and states that the numbness she had been having had lessened from the past. She is having a pre-op on Friday for her gallbladder surgery, and is unsure if her symptoms are from her gallbladder or if they are gallbladder related. I agreed with the patient that the symptoms that she is describing do sound like they could be either cardiac or possibly gallbladder. I advised that I would schedule her an appointment to see Dr. Bing Matter tomorrow, and for her to be here at 2 pm. I let her know that if any of her symptoms worsen, that she needs to go to the ER for evaluation as she may need to proceed with surgery before her pre-op or be evaluated by Cardiology before tomorrow. After discussion she agreed.

## 2018-01-03 ENCOUNTER — Ambulatory Visit: Payer: Self-pay | Admitting: Cardiology

## 2018-01-04 MED FILL — HYDROCODON-APAP 5-325: 5-325 | 8 days supply | Qty: 30 | Fill #0

## 2018-01-07 ENCOUNTER — Emergency Department (HOSPITAL_BASED_OUTPATIENT_CLINIC_OR_DEPARTMENT_OTHER)
Admission: EM | Admit: 2018-01-07 | Discharge: 2018-01-07 | Disposition: A | Discharge status: Home / Self Care | Payer: Self-pay | Attending: Emergency Medicine | Admitting: Emergency Medicine

## 2018-01-07 ENCOUNTER — Emergency Department (HOSPITAL_BASED_OUTPATIENT_CLINIC_OR_DEPARTMENT_OTHER): Payer: Self-pay

## 2018-01-07 ENCOUNTER — Encounter (HOSPITAL_BASED_OUTPATIENT_CLINIC_OR_DEPARTMENT_OTHER): Payer: Self-pay | Admitting: Emergency Medicine

## 2018-01-07 ENCOUNTER — Other Ambulatory Visit: Payer: Self-pay

## 2018-01-07 DIAGNOSIS — I361 Nonrheumatic tricuspid (valve) insufficiency: Secondary | ICD-10-CM | POA: Insufficient documentation

## 2018-01-07 DIAGNOSIS — K8020 Calculus of gallbladder without cholecystitis without obstruction: Secondary | ICD-10-CM | POA: Insufficient documentation

## 2018-01-07 DIAGNOSIS — R1011 Right upper quadrant pain: Secondary | ICD-10-CM | POA: Insufficient documentation

## 2018-01-07 DIAGNOSIS — K8050 Calculus of bile duct without cholangitis or cholecystitis without obstruction: Secondary | ICD-10-CM

## 2018-01-07 DIAGNOSIS — Z79899 Other long term (current) drug therapy: Secondary | ICD-10-CM | POA: Insufficient documentation

## 2018-01-07 DIAGNOSIS — Z87891 Personal history of nicotine dependence: Secondary | ICD-10-CM | POA: Insufficient documentation

## 2018-01-07 LAB — COMPREHENSIVE METABOLIC PANEL
ALBUMIN: 4 g/dL (ref 3.5–5.0)
ALT: 16 U/L (ref 0–44)
AST: 24 U/L (ref 15–41)
Alkaline Phosphatase: 55 U/L (ref 38–126)
Anion gap: 8 (ref 5–15)
BILIRUBIN TOTAL: 0.2 mg/dL — AB (ref 0.3–1.2)
BUN: 11 mg/dL (ref 6–20)
CO2: 25 mmol/L (ref 22–32)
CREATININE: 0.73 mg/dL (ref 0.44–1.00)
Calcium: 9.4 mg/dL (ref 8.9–10.3)
Chloride: 103 mmol/L (ref 98–111)
GFR calc Af Amer: >60 mL/min (ref >60)
GLUCOSE: 105 mg/dL — AB (ref 70–99)
POTASSIUM: 4 mmol/L (ref 3.5–5.1)
Sodium: 136 mmol/L (ref 135–145)
TOTAL PROTEIN: 7.5 g/dL (ref 6.5–8.1)

## 2018-01-07 LAB — CBC
HEMATOCRIT: 41.4 % (ref 36.0–46.0)
HEMOGLOBIN: 13.8 g/dL (ref 12.0–15.0)
MCH: 29.3 pg (ref 26.0–34.0)
MCHC: 33.3 g/dL (ref 30.0–36.0)
MCV: 87.9 fL (ref 80.0–100.0)
Platelets: 294 10*3/uL (ref 150–400)
RBC: 4.71 MIL/uL (ref 3.87–5.11)
RDW: 12 % (ref 11.5–15.5)
WBC: 8.6 10*3/uL (ref 4.0–10.5)
nRBC: 0 % (ref 0.0–0.2)

## 2018-01-07 LAB — LIPASE, BLOOD: Lipase: 36 U/L (ref 11–51)

## 2018-01-07 MED ORDER — SODIUM CHLORIDE 0.9 % IV BOLUS
1000.0000 mL | Freq: Once | INTRAVENOUS | Status: AC
  Administered 2018-01-07: 1000 mL via INTRAVENOUS

## 2018-01-07 MED ORDER — PANTOPRAZOLE SODIUM 40 MG IV SOLR
40.0000 mg | Freq: Once | INTRAVENOUS | Status: AC
  Administered 2018-01-07: 40 mg via INTRAVENOUS
  Filled 2018-01-07: qty 40

## 2018-01-07 MED ORDER — ONDANSETRON HCL 4 MG/2ML IJ SOLN
4.0000 mg | Freq: Once | INTRAMUSCULAR | Status: AC
  Administered 2018-01-07: 4 mg via INTRAVENOUS
  Filled 2018-01-07: qty 2

## 2018-01-07 MED ORDER — KETOROLAC TROMETHAMINE 30 MG/ML IJ SOLN
30.0000 mg | Freq: Once | INTRAMUSCULAR | Status: AC
  Administered 2018-01-07: 30 mg via INTRAVENOUS
  Filled 2018-01-07: qty 1

## 2018-01-07 NOTE — Discharge Instructions (Addendum)
No signs of acute inflammation or infection of gallbladder.   Return for fever greater than 100.71F, persistent vomiting, worsening pain, inability to tolerate fluids or dehydration  Take famotidine (pepcid) throughout the day to help with acid reflux like symptoms.  Continue prilosec. Low fat diet recommended.   I have messaged Dr [name] to ensure close follow up   Can take acetaminophen (tylenol) for pain. Avoid ibuprofen or aspirin as this can irritate stomach

## 2018-01-07 NOTE — ED Notes (Signed)
Patient transported to Ultrasound 

## 2018-01-07 NOTE — ED Provider Notes (Signed)
[facility] EMERGENCY DEPARTMENT Provider Note   CSN: [id number] Arrival date & time: 01/07/18  1748     History   Chief Complaint Chief Complaint  Patient presents with  . Abdominal Pain    HPI Earnesteen Birnie is a 32 y.o. female w/ h/o remote IVDU, endocarditis, severe tricuspid valve regurgitation, cholelithiasis, is here for evaluation of RUQ abdominal pain.  Described as burning, achy. Non radiating. Worse with meals and palpation.  No alleviating factors. Onset 3 months ago, intermittent, worsening over 1 week.  She went to general surgery on Friday and met with Dr Hassell Done [facility]) who recommended elective cholecystectomy in the next couple of days but has not heard back about scheduling surgery. Associated with nausea, nbnb emesis onset 8 pm last night, chills.  She is followed by dentistry, cardiothoracic surgeon, cardiology and is getting prepped for open heart surgery with CT surgeon Dr Ricard Dillon.  She has had recent dental extractions.   Has been using promethazine and percocets for symptoms without relief.  Does not use percocet frequently due to h/o IVDU. Has been clean for 1 yr a 4 months.  Hematochezia that she attributes to constipation for percocet.    Denies fevers, diarrhea, hematemesis, melena, CP, SOB.    HPI  Past Medical History:  Diagnosis Date  . Drug abuse, IV ([place])   . Gallstone   . Heartburn   . Hepatitis C   . Periodontitis chronic, apical    dental caries  . Severe tricuspid regurgitation   . Wears glasses     Patient Active Problem List   Diagnosis Date Noted  . Dental caries 11/02/2017  . Chronic periodontitis 11/02/2017  . Palpitations 09/04/2017  . Dizziness 09/04/2017  . Abdominal pain 03/16/2017  . Atypical chest pain 02/26/2017  . History of endocarditis 02/26/2017  . History of drug abuse ([place]) 02/26/2017  . Smoking 02/26/2017  . Severe tricuspid regurgitation     Past Surgical History:  Procedure Laterality Date  .  CESAREAN SECTION    . LAPAROSCOPIC APPENDECTOMY N/A 07/04/2017   Procedure: APPENDECTOMY LAPAROSCOPIC;  Surgeon: Johnathan Hausen, MD;  Location: [facility];  Service: General;  Laterality: N/A;  . MULTIPLE EXTRACTIONS WITH ALVEOLOPLASTY Bilateral 11/29/2017   Procedure: Extraction of tooth #'s 2,5-15, and 31 with alveoloplasty and gross debridement of remaining dentition;  Surgeon: Lenn Cal, DDS;  Location: [facility];  Service: Oral Surgery;  Laterality: Bilateral;  . TEE WITHOUT CARDIOVERSION N/A 10/03/2017   Procedure: TRANSESOPHAGEAL ECHOCARDIOGRAM (TEE);  Surgeon: Buford Dresser, MD;  Location: [facility] ENDOSCOPY;  Service: Cardiovascular;  Laterality: N/A;     OB History   None      Home Medications    Prior to Admission medications   Medication Sig Start Date End Date Taking? Authorizing Provider  furosemide (LASIX) 80 MG tablet Take 0.5 tablets (40 mg total) by mouth daily. 12/06/17 03/06/18  Park Liter, MD  ibuprofen (ADVIL,MOTRIN) 200 MG tablet Take 600 mg by mouth every 6 (six) hours as needed (for pain.).     [provider]  omeprazole (PRILOSEC) 20 MG capsule Take 1 capsule (20 mg total) by mouth 2 (two) times daily before a meal. Patient taking differently: Take 20 mg by mouth 2 (two) times daily.  03/16/17   Park Liter, MD  oxyCODONE-acetaminophen (PERCOCET) 5-325 MG tablet Take one or two tablets by mouth every 6 hours as needed for moderate to severe dental  pain. 11/29/17   Lenn Cal, DDS  potassium chloride (K-DUR,KLOR-CON) 10 MEQ tablet Take 10 mEq by mouth daily. 11/02/17   [provider]  promethazine (PHENERGAN) 25 MG tablet Take 1 tablet (25 mg total) by mouth every 6 (six) hours as needed for nausea or vomiting. 11/04/17   Mesner, Corene Cornea, MD    Family History Family History  Problem Relation Age of Onset  . Hypothyroidism Mother   . Hypotension Mother   . Healthy Father     Social History Social History    Tobacco Use  . Smoking status: Former Smoker    Packs/day: 1.00    Years: 20.00    Pack years: 20.00    Types: Cigarettes    Last attempt to quit: 04/26/2017    Years since quitting: 0.7  . Smokeless tobacco: Never Used  Substance Use Topics  . Alcohol use: No  . Drug use: Not Currently    Types: IV, Heroin    Comment: Clean x 10 months from IV heroin , used 9 years      Allergies   Patient has no known allergies.   Review of Systems Review of Systems  Gastrointestinal: Positive for abdominal pain, blood in stool, nausea and vomiting.  All other systems reviewed and are negative.    Physical Exam Updated Vital Signs BP 103/79 (BP Location: Right Arm)   Pulse 74   Temp 98.2 F (36.8 C) (Oral)   Resp 18   Ht 5' 8"  (1.727 m)   Wt (!) 140.6 kg   LMP 12/17/2017 (Approximate)   SpO2 100%   BMI 47.14 kg/m   Physical Exam  Constitutional: She is oriented to person, place, and time. She appears well-developed and well-nourished.  Non toxic. Obese female.   HENT:  Head: Normocephalic and atraumatic.  Nose: Nose normal.  No upper dentition. MMM.  Eyes: Pupils are equal, round, and reactive to light. Conjunctivae and EOM are normal.  Neck: Normal range of motion.  Cardiovascular: Normal rate and regular rhythm.  Pulmonary/Chest: Effort normal and breath sounds normal.  Abdominal: Soft. Bowel sounds are normal. There is tenderness in the right upper quadrant. There is positive Murphy's sign.  No G/R/R. No suprapubic or CVA tenderness. Negative Murphy's and McBurney's. No audible BS difficult exam given obesity.   Musculoskeletal: Normal range of motion.  Neurological: She is alert and oriented to person, place, and time.  Skin: Skin is warm and dry. Capillary refill takes less than 2 seconds.  Psychiatric: She has a normal mood and affect. Her behavior is normal.  Nursing note and vitals reviewed.    ED Treatments / Results  Labs (all labs ordered are listed,  but only abnormal results are displayed) Labs Reviewed  COMPREHENSIVE METABOLIC PANEL - Abnormal; Notable for the following components:      Result Value   Glucose, Bld 105 (*)    Total Bilirubin 0.2 (*)    All other components within normal limits  LIPASE, BLOOD  CBC  URINALYSIS, ROUTINE W REFLEX MICROSCOPIC  PREGNANCY, URINE    EKG None  Radiology US Abdomen Limited Ruq  Result Date: 01/07/2018 CLINICAL DATA:  RIGHT upper quadrant pain, nausea and vomiting for 2 days. History of cholelithiasis and hepatitis-C. Scheduled cholecystectomy. EXAM: ULTRASOUND ABDOMEN LIMITED RIGHT UPPER QUADRANT COMPARISON:  Abdominal ultrasound November 04, 2017. FINDINGS: Gallbladder: Multiple echogenic gallstones measuring to 14 mm with acoustic shadowing. No gallbladder wall thickening or pericholecystic fluid. No sonographic Murphy sign elicited. Common bile duct:  Diameter: 6 mm, top normal. Liver: No focal lesion identified. Increased parenchymal echogenicity. Portal vein is patent on color Doppler imaging with normal direction of blood flow towards the liver. IMPRESSION: 1. Cholelithiasis without sonographic findings of acute cholecystitis. 2. Top-normal size of CBD without choledocholithiasis. 3. Hepatic steatosis/hepatocellular disease. Electronically Signed   By: Elon Alas M.D.   On: 01/07/2018 20:32    Procedures Procedures (including critical care time)  Medications Ordered in ED Medications  ondansetron (ZOFRAN) injection 4 mg (4 mg Intravenous Given 01/07/18 1944)  pantoprazole (PROTONIX) injection 40 mg (40 mg Intravenous Given 01/07/18 1943)  sodium chloride 0.9 % bolus 1,000 mL (0 mLs Intravenous Stopped 01/07/18 2110)  ketorolac (TORADOL) 30 MG/ML injection 30 mg (30 mg Intravenous Given 01/07/18 2148)     Initial Impression / Assessment and Plan / ED Course  I have reviewed the triage vital signs and the nursing notes.  Pertinent labs & imaging results that were available  during my care of the patient were reviewed by me and considered in my medical decision making (see chart for details).  Clinical Course as of Jan 08 2203  Mon Jan 07, 2018  2045 US Abdomen Limited RUQ [CG]    Clinical Course User Index [CG] Kinnie Feil, PA-C    32 yo with known cholelithiasis here with worsening RUQ abdominal pain, n/v.  Currently being followed by Dr Hassell Done with [place] [facility] for elective cholecystectomy. +Murphy's on exam. Non toxic. Afebrile. Will have to r/o acute cholecystectomy. No h/o pancreatitis, PUD.   2200: WBC normal. Lipase normal. LFTs normal. RUQ [place] with gallstones and top normal CBD.  Pt had no episodes of emesis in ER. She is tolerating fluids.  Spoke to general surgery (Dr. Marcello Moores) who agrees pt is appropriate for dc.  Will recommend low fat diet, tylenol for pain, promethazine for nausea. Will message Dr Hassell Done to facilitate close follow up. Discussed results, plan and disposition with patient who is in agreement and comfortable with plan.   Final Clinical Impressions(s) / ED Diagnoses   Final diagnoses:  RUQ abdominal pain  Biliary colic    ED Discharge Orders    None       Arlean Hopping 01/07/18 2204    Isla Pence, MD 01/07/18 2330

## 2018-01-07 NOTE — ED Triage Notes (Signed)
Patient reports RUQ abdominal pain.  States that she is supposed to have cholecystectomy either 10/29 or 10/30 but has not received a callback yet.

## 2018-01-08 ENCOUNTER — Encounter: Payer: Self-pay | Admitting: Emergency Medicine

## 2018-01-11 ENCOUNTER — Other Ambulatory Visit: Payer: Self-pay

## 2018-01-11 ENCOUNTER — Encounter (HOSPITAL_COMMUNITY): Payer: Self-pay | Admitting: *Deleted

## 2018-01-11 ENCOUNTER — Ambulatory Visit: Payer: Self-pay | Admitting: General Surgery

## 2018-01-11 NOTE — Progress Notes (Signed)
Heather Day complains of epicastric pain, chest pain and back pain.  Patient states the pain 1/10, "it really is pressure more than anything."  Patient is taking Advil for pain, I instructed patient to stop taking Advil today.  Dr Bing Matter, patient's cardiologist is aware of the chest pain, he documented it in his notes in January 2019. Patient reports that she  Gets short of breath climbing stairs, but not at rest.

## 2018-01-14 ENCOUNTER — Other Ambulatory Visit: Payer: Self-pay | Admitting: Cardiology

## 2018-01-14 MED ORDER — DEXTROSE 5 % IV SOLN
3.0000 g | INTRAVENOUS | Status: AC
  Administered 2018-01-15: 3 g via INTRAVENOUS
  Filled 2018-01-14: qty 3000

## 2018-01-14 NOTE — H&P (Signed)
History of Present Illness Heather Day B. Heather Deutscher MD; 01/04/2018 3:14 PM) The patient is a 32 year old female who presents for evaluation of gall stones. 32 year old WF who had a recent total mouth extraction and is having gallbladder attacks (seen in the ER and referred to the office) and who is awaiting a tricuspid valve repair by Dr. Ashley Mariner presents to be scheduled for lap chole. She has gallstones by ultrasound up to 1.6 cm. Dr. Cornelius Moras wants her to have a cholecystectomy before she has her valvuloplasty. She has had a prior lap appy by me this past April. She is given a lap chole book and is aware of the surgery and its risks. Since I will be away for 2 weeks, I will try to get her set up with a surgeon that has time next week.  She has a history of IV drug use and endocarditis. She was seen in the [facility] ER 8/25 with abdominal pain and vomiting and ultrasound noted her stones without apparent acute changes. She wants to proceed with lap chole.    Allergies (Tanisha A. Manson Passey, RMA; 01/04/2018 2:41 PM) No Known Drug Allergies [07/17/2017]: Allergies Reconciled   Medication History (Tanisha A. Manson Passey, RMA; 01/04/2018 2:42 PM) oxyCODONE-Acetaminophen (5-325MG  Tablet, Oral) Active. Omeprazole (20MG  Capsule DR, Oral) Active. Potassium Chloride Crys ER ( Tablet ER, Oral) Active. Promethazine HCl (25MG  Tablet, Oral) Active. Lasix (80MG  Tablet, Oral) Active. Medications Reconciled  Vitals (Tanisha A. Brown RMA; 01/04/2018 2:41 PM) 01/04/2018 2:41 PM Weight: 309.6 lb Height: 68in Body Surface Area: 2.46 m Body Mass Index: 47.07 kg/m  Temp.: 98.26F  Pulse: 98 (Regular)  BP: 126/86 (Sitting, Left Arm, Standard)       Physical Exam (Matthew B. Heather Deutscher MD; 01/04/2018 3:21 PM) General Note: Obese WF HEENT nasal piercing; recent teeth extraction prior to heart surgery.  Chest clear Heart SR with gallop-could appreciate tricuspid regurgitation Abdomen is obese  and minimally tender Ext FROM     Assessment & Plan Heather Day B. Heather Deutscher MD; 01/04/2018 3:23 PM) CHRONIC CHOLECYSTITIS WITH CALCULUS (K80.10) Impression: Chronic cholecystitis with need for lap chole before tricuspid valve can be replaced. Will request surgery by surgeon who has time in the next week.  I went over the procedure with her in detail with the [facility]ames book and highlighted CBD injury. She tolerated her lap appy. She is using compazine for her nausea. Will give #20 Percocet.  Signed electronically by Valarie Merino, MD (01/04/2018 3:24 PM)

## 2018-01-14 NOTE — Anesthesia Preprocedure Evaluation (Addendum)
Anesthesia Evaluation  Patient identified by MRN, date of birth, ID band Patient awake    Reviewed: Allergy & Precautions, H&P , NPO status , Patient's Chart, lab work & pertinent test results  Airway Mallampati: II  TM Distance: >3 FB Neck ROM: Full    Dental no notable dental hx. (+) Edentulous Upper, Partial Lower, Dental Advisory Given   Pulmonary neg pulmonary ROS, former smoker,    Pulmonary exam normal breath sounds clear to auscultation       Cardiovascular Exercise Tolerance: Good + Valvular Problems/Murmurs  Rhythm:Regular Rate:Normal     Neuro/Psych Anxiety Bipolar Disorder negative neurological ROS     GI/Hepatic GERD  Medicated and Controlled,(+) Hepatitis -, C  Endo/Other  Morbid obesity  Renal/GU negative Renal ROS  negative genitourinary   Musculoskeletal   Abdominal   Peds  Hematology negative hematology ROS (+)   Anesthesia Other Findings   Reproductive/Obstetrics negative OB ROS                           Anesthesia Physical Anesthesia Plan  ASA: III  Anesthesia Plan: General   Post-op Pain Management:    Induction: Intravenous  PONV Risk Score and Plan: 4 or greater and Ondansetron, Dexamethasone and Midazolam  Airway Management Planned: Oral ETT  Additional Equipment:   Intra-op Plan:   Post-operative Plan: Extubation in OR  Informed Consent: I have reviewed the patients History and Physical, chart, labs and discussed the procedure including the risks, benefits and alternatives for the proposed anesthesia with the patient or authorized representative who has indicated his/her understanding and acceptance.   Dental advisory given  Plan Discussed with: CRNA  Anesthesia Plan Comments: (PAT note written 01/14/2018 by Shonna Chock, PA-C. History of severe TR. )       Anesthesia Quick Evaluation

## 2018-01-14 NOTE — Progress Notes (Signed)
Anesthesia Chart Review: Maury Dus   Case:  [id number] Date/Time:  01/15/18 0915   Procedure:  LAPAROSCOPIC CHOLECYSTECTOMY ERAS PATHWAY (N/A )   Anesthesia type:  General   Pre-op diagnosis:  Chronic cholecystitis w/calculus   [facility]  [facility] ROOM 10 / [facility]   Surgeon:  Glenna Fellows, MD      DISCUSSION: Patient is a 32 year old female scheduled for the above procedure.  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 She moved to [facility] from [place] in 2018. BMI is consistent with morbid obesity.  She had initial consultation with CT surgeon Dr. Cornelius Moras in August regarding severe TR. He thinks it would be reasonable to consider elective tricuspid repair or replace, but referred her for dental extraction and general surgery evaluation for cholecystectomy first. She underwent dental extractions on 11/29/17. She saw her cardiologist Dr. Bing Matter on 12/06/17. He is aware of future plans for cholecystectomy.   ED visit 01/07/18 for RUQ abdominal pain. She apparently had been seen by general surgery 01/04/18 and was waiting for cholecystectomy to be scheduled. Symptoms includes N/V and chills. WBC, lipase, and LFTs WNL. RUQ U/S with gallstones and top normal CBD. Tolerating fluids in ED. General surgery agreed with discharge home and would schedule surgery in the near future.  Based on currently available information, I would anticipate that she can proceed as planned if no acute changes.   VS: LMP 12/17/2017 (Approximate) . VS 01/07/18: BP 103/79, HR 74, RR 18, SpO2 100%. T 36.8 C.  PROVIDERS: Patient, No Pcp Per - [name], MD is cardiologist. First established 02/26/17 for evaluation of chest pain with known tricuspid endocarditis history (had refused surgery when she lived in  [place]). Testing as outlined below with referral to CT surgery. Last visit 12/06/17.  Tressie Stalker, MD is CT surgeon. Last seen 10/30/17. He thinks it would be reasonable to consider elective tricuspid repair or replacement, but will need dental extractions first and may also need cholecystectomy as well. She was referred to [facility] [name], Molli Hazard, MD) and to Dr. Kristin Bruins. At some point he plans to obtain a cardiac gated CT angiogram of the heart to evaluate her coronary anatomy, but has not been ordered yet.She denied prior cardiac cath.    LABS: Labs on 01/07/18 include: Lab Results  Component Value Date   WBC 8.6 01/07/2018   HGB 13.8 01/07/2018   HCT 41.4 01/07/2018   PLT 294 01/07/2018   GLUCOSE 105 (H) 01/07/2018   ALT 16 01/07/2018   AST 24 01/07/2018   NA 136 01/07/2018   K 4.0 01/07/2018   CL 103 01/07/2018   CREATININE 0.73 01/07/2018   BUN 11 01/07/2018   CO2 25 01/07/2018   INR 0.86 11/28/2017    IMAGES: Abdominal U/S 01/07/18: IMPRESSION: 1. Cholelithiasis without sonographic findings of acute cholecystitis. 2. Top-normal size of CBD without choledocholithiasis. 3. Hepatic steatosis/hepatocellular disease.  CXR 03/14/17: IMPRESSION: No active cardiopulmonary disease.   EKG:07/03/17: NSR, non-specific T wave abnormality.   CV: TEE 10/03/17: Study Conclusions - Left ventricle: Systolic function was normal. The estimated ejection fraction was in the range of 55% to 60%. Wall motion was normal; there were no regional wall motion abnormalities. - Aortic valve: There was no regurgitation. - Mitral valve: There was trivial regurgitation. - Left atrium: No evidence of thrombus in  the atrial cavity or appendage. - Right atrium: No evidence of thrombus in the atrial cavity or appendage. - Atrial septum: No defect or patent foramen ovale was identified. - Tricuspid valve: Tricuspid valve in 3D showed apparent partial destruction of posterior  leaflet leading to malcoaptation (See image 80) There was severe regurgitation directed eccentrically and toward the septum. Peak RV-RA gradient (S): 30 mm Hg. - Pulmonic valve: There was trivial regurgitation. Impressions: - Severe eccentric TR, initially appeared as malcoaptation but on 3D imaging, the posterior leaflet appears to be partially destroyed. No evidence of current vegetation. No other significant valvular abnormalities.  Echo (TTE) 09/24/17: Impressions: - Normal LVEF. Moderate RA enlargement. Mderate - severe TR. Normal PAP. TR is worst as compare to previous echo from 2018. Possibly flail TV leaflet.  48 hour Holter monitor 09/24/17: Baseline rhythm: Normal sinus rhythm Minimum heart rate: 56 BPM. Average heart rate: 85BPM. Maximal heart rate 149 BPM. Atrial arrhythmia: 2 premature supraventricular beats Ventricular arrhythmia: 1 premature ventricular beat Conduction abnormality: None Symptoms: None Conclusion: Normal Holter monitor.   Past Medical History:  Diagnosis Date  . Anxiety    panic attacks  . Bipolar disorder (HCC)   . Drug abuse, IV (HCC)   . Dyspnea    when walking up stairs  . Gallstone   . GERD (gastroesophageal reflux disease)   . Heartburn   . Hepatitis C   . Periodontitis chronic, apical    dental caries  . PTSD (post-traumatic stress disorder)   . Severe tricuspid regurgitation   . Wears glasses     Past Surgical History:  Procedure Laterality Date  . CESAREAN SECTION    . LAPAROSCOPIC APPENDECTOMY N/A 07/04/2017   Procedure: APPENDECTOMY LAPAROSCOPIC;  Surgeon: Luretha Murphy, MD;  Location: [facility] ORS;  Service: General;  Laterality: N/A;  . MULTIPLE EXTRACTIONS WITH ALVEOLOPLASTY Bilateral 11/29/2017   Procedure: Extraction of tooth #'s 2,5-15, and 31 with alveoloplasty and gross debridement of remaining dentition;  Surgeon: Charlynne Pander, DDS;  Location: [facility] OR;  Service: Oral Surgery;  Laterality:  Bilateral;  . TEE WITHOUT CARDIOVERSION N/A 10/03/2017   Procedure: TRANSESOPHAGEAL ECHOCARDIOGRAM (TEE);  Surgeon: Jodelle Red, MD;  Location: [facility] ENDOSCOPY;  Service: Cardiovascular;  Laterality: N/A;    MEDICATIONS: No current facility-administered medications for this encounter.    . furosemide (LASIX) 80 MG tablet  . ibuprofen (ADVIL,MOTRIN) 200 MG tablet  . omeprazole (PRILOSEC) 20 MG capsule  . potassium chloride (K-DUR,KLOR-CON) 10 MEQ tablet  . promethazine (PHENERGAN) 25 MG tablet  . oxyCODONE-acetaminophen (PERCOCET) 5-325 MG tablet    Velna Ochs [facility] Short Stay Center/Anesthesiology Phone [phone] 01/14/2018 9:51 AM

## 2018-01-15 ENCOUNTER — Ambulatory Visit (HOSPITAL_COMMUNITY): Payer: Self-pay

## 2018-01-15 ENCOUNTER — Ambulatory Visit (HOSPITAL_COMMUNITY): Payer: Self-pay | Admitting: Vascular Surgery

## 2018-01-15 ENCOUNTER — Encounter (HOSPITAL_COMMUNITY): Payer: Self-pay

## 2018-01-15 ENCOUNTER — Ambulatory Visit (HOSPITAL_COMMUNITY)
Admission: RE | Admit: 2018-01-15 | Discharge: 2018-01-15 | Disposition: A | Discharge status: Home / Self Care | Payer: Self-pay | Source: Ambulatory Visit | Attending: General Surgery | Admitting: General Surgery

## 2018-01-15 ENCOUNTER — Other Ambulatory Visit: Payer: Self-pay

## 2018-01-15 ENCOUNTER — Encounter (HOSPITAL_COMMUNITY)
Admission: RE | Disposition: A | Discharge status: Home / Self Care | Payer: Self-pay | Source: Ambulatory Visit | Attending: General Surgery

## 2018-01-15 DIAGNOSIS — K8010 Calculus of gallbladder with chronic cholecystitis without obstruction: Secondary | ICD-10-CM | POA: Insufficient documentation

## 2018-01-15 DIAGNOSIS — Z79899 Other long term (current) drug therapy: Secondary | ICD-10-CM | POA: Insufficient documentation

## 2018-01-15 DIAGNOSIS — K219 Gastro-esophageal reflux disease without esophagitis: Secondary | ICD-10-CM | POA: Insufficient documentation

## 2018-01-15 DIAGNOSIS — Z419 Encounter for procedure for purposes other than remedying health state, unspecified: Secondary | ICD-10-CM

## 2018-01-15 DIAGNOSIS — Z87891 Personal history of nicotine dependence: Secondary | ICD-10-CM | POA: Insufficient documentation

## 2018-01-15 DIAGNOSIS — Z6841 Body Mass Index (BMI) 40.0 and over, adult: Secondary | ICD-10-CM | POA: Insufficient documentation

## 2018-01-15 DIAGNOSIS — I071 Rheumatic tricuspid insufficiency: Secondary | ICD-10-CM | POA: Insufficient documentation

## 2018-01-15 HISTORY — PX: INTRAOPERATIVE CHOLANGIOGRAM: SHX5230

## 2018-01-15 HISTORY — DX: Dyspnea, unspecified: R06.00

## 2018-01-15 HISTORY — DX: Post-traumatic stress disorder, unspecified: F43.10

## 2018-01-15 HISTORY — DX: Anxiety disorder, unspecified: F41.9

## 2018-01-15 HISTORY — DX: Gastro-esophageal reflux disease without esophagitis: K21.9

## 2018-01-15 HISTORY — DX: Bipolar disorder, unspecified: F31.9

## 2018-01-15 HISTORY — PX: CHOLECYSTECTOMY: SHX55

## 2018-01-15 LAB — POCT PREGNANCY, URINE: Preg Test, Ur: NEGATIVE

## 2018-01-15 SURGERY — LAPAROSCOPIC CHOLECYSTECTOMY
Anesthesia: General | Site: Abdomen

## 2018-01-15 MED ORDER — MIDAZOLAM HCL 2 MG/2ML IJ SOLN
INTRAMUSCULAR | Status: AC
  Filled 2018-01-15: qty 2

## 2018-01-15 MED ORDER — GABAPENTIN 300 MG PO CAPS
300.0000 mg | ORAL_CAPSULE | ORAL | Status: AC
  Administered 2018-01-15: 300 mg via ORAL
  Filled 2018-01-15: qty 1

## 2018-01-15 MED ORDER — PROPOFOL 10 MG/ML IV BOLUS
INTRAVENOUS | Status: AC
  Filled 2018-01-15: qty 20

## 2018-01-15 MED ORDER — CELECOXIB 200 MG PO CAPS
200.0000 mg | ORAL_CAPSULE | ORAL | Status: AC
  Administered 2018-01-15: 200 mg via ORAL
  Filled 2018-01-15: qty 1

## 2018-01-15 MED ORDER — OXYCODONE HCL 5 MG PO TABS: 5.0000 mg | ORAL_TABLET | Freq: Four times a day (QID) | ORAL | 0 refills | Status: DC | PRN

## 2018-01-15 MED ORDER — 0.9 % SODIUM CHLORIDE (POUR BTL) OPTIME
TOPICAL | Status: DC | PRN
  Administered 2018-01-15: 1000 mL

## 2018-01-15 MED ORDER — ROCURONIUM BROMIDE 100 MG/10ML IV SOLN
INTRAVENOUS | Status: DC | PRN
  Administered 2018-01-15: 20 mg via INTRAVENOUS
  Administered 2018-01-15: 30 mg via INTRAVENOUS

## 2018-01-15 MED ORDER — OXYCODONE HCL 5 MG PO TABS
5.0000 mg | ORAL_TABLET | Freq: Once | ORAL | Status: AC
  Administered 2018-01-15: 5 mg via ORAL

## 2018-01-15 MED ORDER — ONDANSETRON HCL 4 MG/2ML IJ SOLN
INTRAMUSCULAR | Status: DC | PRN
  Administered 2018-01-15: 4 mg via INTRAVENOUS

## 2018-01-15 MED ORDER — SODIUM CHLORIDE 0.9 % IV SOLN
INTRAVENOUS | Status: DC | PRN
  Administered 2018-01-15: 10:00:00

## 2018-01-15 MED ORDER — SODIUM CHLORIDE 0.9 % IR SOLN
Status: DC | PRN
  Administered 2018-01-15: 1000 mL

## 2018-01-15 MED ORDER — OXYCODONE HCL 5 MG PO TABS
ORAL_TABLET | ORAL | Status: AC
  Filled 2018-01-15: qty 1

## 2018-01-15 MED ORDER — ACETAMINOPHEN 500 MG PO TABS
1000.0000 mg | ORAL_TABLET | ORAL | Status: AC
  Administered 2018-01-15: 1000 mg via ORAL
  Filled 2018-01-15: qty 2

## 2018-01-15 MED ORDER — HEPARIN SODIUM (PORCINE) 5000 UNIT/ML IJ SOLN
5000.0000 [IU] | Freq: Once | INTRAMUSCULAR | Status: AC
  Administered 2018-01-15: 5000 [IU] via SUBCUTANEOUS
  Filled 2018-01-15: qty 1

## 2018-01-15 MED ORDER — SUCCINYLCHOLINE CHLORIDE 20 MG/ML IJ SOLN
INTRAMUSCULAR | Status: DC | PRN
  Administered 2018-01-15: 100 mg via INTRAVENOUS

## 2018-01-15 MED ORDER — BUPIVACAINE-EPINEPHRINE 0.5% -1:200000 IJ SOLN
INTRAMUSCULAR | Status: AC
  Filled 2018-01-15: qty 1

## 2018-01-15 MED ORDER — CHLORHEXIDINE GLUCONATE CLOTH 2 % EX PADS: 6.0000 | MEDICATED_PAD | Freq: Once | CUTANEOUS | Status: DC

## 2018-01-15 MED ORDER — PHENYLEPHRINE HCL 10 MG/ML IJ SOLN
INTRAMUSCULAR | Status: DC | PRN
  Administered 2018-01-15: 100 ug via INTRAVENOUS

## 2018-01-15 MED ORDER — LIDOCAINE 2% (20 MG/ML) 5 ML SYRINGE
INTRAMUSCULAR | Status: DC | PRN
  Administered 2018-01-15: 30 mg via INTRAVENOUS

## 2018-01-15 MED ORDER — LACTATED RINGERS IV SOLN
INTRAVENOUS | Status: DC
  Administered 2018-01-15: 08:00:00 via INTRAVENOUS

## 2018-01-15 MED ORDER — PROPOFOL 10 MG/ML IV BOLUS
INTRAVENOUS | Status: DC | PRN
  Administered 2018-01-15: 150 mg via INTRAVENOUS

## 2018-01-15 MED ORDER — SUGAMMADEX SODIUM 200 MG/2ML IV SOLN
INTRAVENOUS | Status: DC | PRN
  Administered 2018-01-15: 400 mg via INTRAVENOUS

## 2018-01-15 MED ORDER — FENTANYL CITRATE (PF) 100 MCG/2ML IJ SOLN
INTRAMUSCULAR | Status: DC | PRN
  Administered 2018-01-15 (×2): 100 ug via INTRAVENOUS

## 2018-01-15 MED ORDER — HYDROMORPHONE HCL 1 MG/ML IJ SOLN: 0.2500 mg | INTRAMUSCULAR | Status: DC | PRN

## 2018-01-15 MED ORDER — LACTATED RINGERS IV SOLN
INTRAVENOUS | Status: DC | PRN
  Administered 2018-01-15 (×2): via INTRAVENOUS

## 2018-01-15 MED ORDER — IOPAMIDOL (ISOVUE-300) INJECTION 61%
INTRAVENOUS | Status: AC
  Filled 2018-01-15: qty 50

## 2018-01-15 MED ORDER — DEXAMETHASONE SODIUM PHOSPHATE 10 MG/ML IJ SOLN
INTRAMUSCULAR | Status: DC | PRN
  Administered 2018-01-15: 10 mg via INTRAVENOUS

## 2018-01-15 MED ORDER — FENTANYL CITRATE (PF) 250 MCG/5ML IJ SOLN
INTRAMUSCULAR | Status: AC
  Filled 2018-01-15: qty 5

## 2018-01-15 MED ORDER — BUPIVACAINE-EPINEPHRINE 0.5% -1:200000 IJ SOLN
INTRAMUSCULAR | Status: DC | PRN
  Administered 2018-01-15: 50 mL

## 2018-01-15 MED FILL — FUROSEMIDE 40 MG TAB: 40 | 90 days supply | Qty: 90 | Fill #0

## 2018-01-15 MED FILL — POTASSIUM CHL ER M10 TABLET: 10 | 90 days supply | Qty: 90 | Fill #0

## 2018-01-15 MED FILL — oxyCODONE HCL 5 MG TABS: 5 | 3 days supply | Qty: 12 | Fill #0

## 2018-01-15 SURGICAL SUPPLY — 46 items
APPLIER CLIP ROT 10 11.4 M/L (STAPLE) ×3
BLADE CLIPPER SURG (BLADE) IMPLANT
CANISTER SUCT 3000ML PPV (MISCELLANEOUS) ×3 IMPLANT
CHLORAPREP W/TINT 26ML (MISCELLANEOUS) ×3 IMPLANT
CLIP APPLIE ROT 10 11.4 M/L (STAPLE) ×1 IMPLANT
CONT SPEC 4OZ CLIKSEAL STRL BL (MISCELLANEOUS) ×3 IMPLANT
COVER MAYO STAND STRL (DRAPES) ×3 IMPLANT
COVER SURGICAL LIGHT HANDLE (MISCELLANEOUS) ×3 IMPLANT
COVER WAND RF STERILE (DRAPES) ×3 IMPLANT
DERMABOND ADHESIVE PROPEN (GAUZE/BANDAGES/DRESSINGS) ×2
DERMABOND ADVANCED (GAUZE/BANDAGES/DRESSINGS) ×2
DERMABOND ADVANCED .7 DNX12 (GAUZE/BANDAGES/DRESSINGS) ×1 IMPLANT
DERMABOND ADVANCED .7 DNX6 (GAUZE/BANDAGES/DRESSINGS) ×1 IMPLANT
DRAPE C-ARM 42X72 X-RAY (DRAPES) ×3 IMPLANT
ELECT REM PT RETURN 9FT ADLT (ELECTROSURGICAL) ×3
ELECTRODE REM PT RTRN 9FT ADLT (ELECTROSURGICAL) ×1 IMPLANT
GLOVE BIO SURGEON STRL SZ 6.5 (GLOVE) ×2 IMPLANT
GLOVE BIO SURGEONS STRL SZ 6.5 (GLOVE) ×1
GLOVE BIOGEL PI IND STRL 8 (GLOVE) ×1 IMPLANT
GLOVE BIOGEL PI INDICATOR 8 (GLOVE) ×2
GLOVE ECLIPSE 7.5 STRL STRAW (GLOVE) ×3 IMPLANT
GOWN STRL REUS W/ TWL LRG LVL3 (GOWN DISPOSABLE) ×2 IMPLANT
GOWN STRL REUS W/ TWL XL LVL3 (GOWN DISPOSABLE) ×1 IMPLANT
GOWN STRL REUS W/TWL LRG LVL3 (GOWN DISPOSABLE) ×4
GOWN STRL REUS W/TWL XL LVL3 (GOWN DISPOSABLE) ×2
KIT BASIN OR (CUSTOM PROCEDURE TRAY) ×3 IMPLANT
KIT TURNOVER KIT B (KITS) ×3 IMPLANT
NEEDLE 22X1 1/2 (OR ONLY) (NEEDLE) ×3 IMPLANT
NS IRRIG 1000ML POUR BTL (IV SOLUTION) ×3 IMPLANT
PAD ARMBOARD 7.5X6 YLW CONV (MISCELLANEOUS) ×3 IMPLANT
POUCH RETRIEVAL ECOSAC 10 (ENDOMECHANICALS) ×1 IMPLANT
POUCH RETRIEVAL ECOSAC 10MM (ENDOMECHANICALS) ×2
SCISSORS LAP 5X35 DISP (ENDOMECHANICALS) ×3 IMPLANT
SET CHOLANGIOGRAPH 5 50 .035 (SET/KITS/TRAYS/PACK) ×3 IMPLANT
SET IRRIG TUBING LAPAROSCOPIC (IRRIGATION / IRRIGATOR) ×3 IMPLANT
SLEEVE ENDOPATH XCEL 5M (ENDOMECHANICALS) ×3 IMPLANT
SPECIMEN JAR SMALL (MISCELLANEOUS) ×3 IMPLANT
SUT MON AB 5-0 PS2 18 (SUTURE) ×3 IMPLANT
TOWEL OR 17X24 6PK STRL BLUE (TOWEL DISPOSABLE) ×3 IMPLANT
TOWEL OR 17X26 10 PK STRL BLUE (TOWEL DISPOSABLE) ×3 IMPLANT
TRAY LAPAROSCOPIC MC (CUSTOM PROCEDURE TRAY) ×3 IMPLANT
TROCAR XCEL BLUNT TIP 100MML (ENDOMECHANICALS) ×3 IMPLANT
TROCAR XCEL NON-BLD 11X100MML (ENDOMECHANICALS) ×3 IMPLANT
TROCAR XCEL NON-BLD 5MMX100MML (ENDOMECHANICALS) ×3 IMPLANT
TUBING INSUFFLATION (TUBING) ×3 IMPLANT
WATER STERILE IRR 1000ML POUR (IV SOLUTION) ×3 IMPLANT

## 2018-01-15 NOTE — Op Note (Signed)
Preoperative diagnosis: Cholelithiasis and cholecystitis  Postoperative diagnosis: Cholelithiasis and cholecystitis  Surgical procedure: Laparoscopic cholecystectomy with intraoperative cholangiogram  Surgeon: Sharlet Salina T. Rafe Mackowski M.D.  Assistant: None  Anesthesia: General Endotracheal  Complications: None  Estimated blood loss: Minimal  Description of procedure: The patient brought to the operating room, placed in the supine position on the operating table, and general endotracheal anesthesia induced. The abdomen was widely sterilely prepped and draped. The patient had received preoperative IV antibiotics and PAS were in place. Patient timeout was performed the correct procedure verified. Standard 4 port technique was used with an open Hassan cannula above the umbilicus due to obesity and the remainder of the ports placed under direct vision. The gallbladder was visualized. It appeared minimally if at all chronically inflamed. The fundus was grasped and elevated up over the liver and the infundibulum retracted inferiolaterally. Peritoneum anterior and posterior to close triangle was incised and fibrofatty tissue stripped off the neck of the gallbladder toward the porta hepatis. The distal gallbladder was thoroughly dissected. The cystic artery was identified in Calot's triangle and the cystic duct gallbladder junction dissected 360.  A good critical view was obtained. When the anatomy was clear the cystic duct was clipped at the gallbladder junction and an operative cholangiogram obtained through the cystic duct. This showed good filling of a normal common bile duct and intrahepatic ducts with free flow into the duodenum and no filling defects. Following this the cholangiocath was removed and the cystic duct was doubly clipped proximally and divided. The cystic artery was doubly clipped proximally and distally and divided. The gallbladder was dissected free from its bed using hook cautery and  removed through the umbilical port site. Complete hemostasis was obtained in the gallbladder bed. The right upper quadrant was thoroughly irrigated and hemostasis assured. Trochars were removed and all CO2 evacuated and the [facility] trocar site fascial defect closed. Skin incisions were closed with subcuticular Monocryl and Dermabond. Sponge needle and instrument counts were correct. The patient was taken to PACU in good condition.  Lorne Skeens Brenlee Koskela  01/15/2018

## 2018-01-15 NOTE — Transfer of Care (Signed)
Immediate Anesthesia Transfer of Care Note  Patient: Heather Day  Procedure(s) Performed: LAPAROSCOPIC CHOLECYSTECTOMY ERAS PATHWAY (N/A Abdomen) INTRAOPERATIVE CHOLANGIOGRAM (N/A Abdomen)  Patient Location: PACU  Anesthesia Type:General  Level of Consciousness: awake, alert  and oriented  Airway & Oxygen Therapy: Patient Spontanous Breathing  Post-op Assessment: Report given to RN and Post -op Vital signs reviewed and stable  Post vital signs: Reviewed and stable  Last Vitals:  Vitals Value Taken Time  BP 117/56 01/15/2018 11:01 AM  Temp    Pulse 76 01/15/2018 11:04 AM  Resp 13 01/15/2018 11:04 AM  SpO2 91 % 01/15/2018 11:04 AM  Vitals shown include unvalidated device data.  Last Pain:  Vitals:   01/15/18 0808  PainSc: 7       Patients Stated Pain Goal: 5 (01/15/18 1610)  Complications: No apparent anesthesia complications

## 2018-01-15 NOTE — Anesthesia Procedure Notes (Signed)
Procedure Name: Intubation Date/Time: 01/15/2018 9:40 AM Performed by: Eligha Bridegroom, CRNA Pre-anesthesia Checklist: Patient identified, Emergency Drugs available, Suction available, Patient being monitored and Timeout performed Patient Re-evaluated:Patient Re-evaluated prior to induction Oxygen Delivery Method: Circle system utilized Preoxygenation: Pre-oxygenation with 100% oxygen Induction Type: IV induction and Rapid sequence Ventilation: Mask ventilation without difficulty Laryngoscope Size: Mac and 3 Tube type: Oral Tube size: 7.0 mm Number of attempts: 1 Airway Equipment and Method: Stylet Placement Confirmation: ETT inserted through vocal cords under direct vision,  positive ETCO2 and breath sounds checked- equal and bilateral Secured at: 21 cm Tube secured with: Tape Dental Injury: Teeth and Oropharynx as per pre-operative assessment

## 2018-01-15 NOTE — Anesthesia Postprocedure Evaluation (Signed)
Anesthesia Post Note  Patient: Heather Day  Procedure(s) Performed: LAPAROSCOPIC CHOLECYSTECTOMY ERAS PATHWAY (N/A Abdomen) INTRAOPERATIVE CHOLANGIOGRAM (N/A Abdomen)     Patient location during evaluation: PACU Anesthesia Type: General Level of consciousness: awake and alert Pain management: pain level controlled Vital Signs Assessment: post-procedure vital signs reviewed and stable Respiratory status: spontaneous breathing, nonlabored ventilation and respiratory function stable Cardiovascular status: blood pressure returned to baseline and stable Postop Assessment: no apparent nausea or vomiting Anesthetic complications: no    Last Vitals:  Vitals:   01/15/18 1215 01/15/18 1240  BP: 112/77 117/77  Pulse: 78 76  Resp: 16 13  Temp: (!) 36.2 C (!) 36.2 C  SpO2: 97% 97%    Last Pain:  Vitals:   01/15/18 1223  PainSc: 2                  [name],W. EDMOND

## 2018-01-15 NOTE — Discharge Instructions (Signed)
[facility] ______[facility] °LAPAROSCOPIC SURGERY: POST OP INSTRUCTIONS °Always review your discharge instruction sheet given to you by the facility where your surgery was performed. °IF YOU HAVE DISABILITY OR FAMILY LEAVE FORMS, YOU MUST BRING THEM TO THE OFFICE FOR PROCESSING.   °DO NOT GIVE THEM TO YOUR DOCTOR. ° °1. A prescription for pain medication may be given to you upon discharge.  Take your pain medication as prescribed, if needed.  If narcotic pain medicine is not needed, then you may take acetaminophen (Tylenol) or ibuprofen (Advil) as needed. °2. Take your usually prescribed medications unless otherwise directed. °3. If you need a refill on your pain medication, please contact your pharmacy.  They will contact our office to request authorization. Prescriptions will not be filled after 5pm or on week-ends. °4. You should follow a light diet the first few days after arrival home, such as soup and crackers, etc.  Be sure to include lots of fluids daily. °5. Most patients will experience some swelling and bruising in the area of the incisions.  Ice packs will help.  Swelling and bruising can take several days to resolve.  °6. It is common to experience some constipation if taking pain medication after surgery.  Increasing fluid intake and taking a stool softener (such as Colace) will usually help or prevent this problem from occurring.  A mild laxative (Milk of Magnesia or Miralax) should be taken according to package instructions if there are no bowel movements after 48 hours. °7. Unless discharge instructions indicate otherwise, you may remove your bandages 24-48 hours after surgery, and you may shower at that time.  You may have steri-strips (small skin tapes) in place directly over the incision.  These strips should be left on the skin for 7-10 days.  If your surgeon used skin glue on the incision, you may shower in 24 hours.  The glue will flake off over the next 2-3 weeks.  Any sutures or  staples will be removed at the office during your follow-up visit. °8. ACTIVITIES:  You may resume regular (light) daily activities beginning the next day--such as daily self-care, walking, climbing stairs--gradually increasing activities as tolerated.  You may have sexual intercourse when it is comfortable.  Refrain from any heavy lifting or straining until approved by your doctor. °a. You may drive when you are no longer taking prescription pain medication, you can comfortably wear a seatbelt, and you can safely maneuver your car and apply brakes. °b. RETURN TO WORK:  __________________________________________________________ °9. You should see your doctor in the office for a follow-up appointment approximately 2-3 weeks after your surgery.  Make sure that you call for this appointment within a day or two after you arrive home to insure a convenient appointment time. °10. OTHER INSTRUCTIONS: __________________________________________________________________________________________________________________________ __________________________________________________________________________________________________________________________ °WHEN TO CALL YOUR DOCTOR: °1. Fever over 101.0 °2. Inability to urinate °3. Continued bleeding from incision. °4. Increased pain, redness, or drainage from the incision. °5. Increasing abdominal pain ° °The clinic staff is available to answer your questions during regular business hours.  Please don’t hesitate to call and ask to speak to one of the nurses for clinical concerns.  If you have a medical emergency, go to the nearest emergency room or call 911.  A surgeon from [facility] is always on call at the hospital. °[address], [address], [place], [place]  [address] ? [address] [address], [place], [place]   [address] °[phone] ? [phone] ? FAX [phone] °Web site:   [email or web address] °

## 2018-01-15 NOTE — Interval H&P Note (Signed)
History and Physical Interval Note:  01/15/2018 9:16 AM  Heather Day  has presented today for surgery, with the diagnosis of Chronic cholecystitis w/calculus  The various methods of treatment have been discussed with the patient and family. After consideration of risks, benefits and other options for treatment, the patient has consented to  Procedure(s): LAPAROSCOPIC CHOLECYSTECTOMY ERAS PATHWAY (N/A) as a surgical intervention .  The patient's history has been reviewed, patient examined, no change in status, stable for surgery.  I have reviewed the patient's chart and labs.  Questions were answered to the patient's satisfaction.     Lorne Skeens Keiton Cosma

## 2018-01-16 ENCOUNTER — Encounter (HOSPITAL_COMMUNITY): Payer: Self-pay | Admitting: General Surgery

## 2018-01-20 ENCOUNTER — Encounter (HOSPITAL_BASED_OUTPATIENT_CLINIC_OR_DEPARTMENT_OTHER): Payer: Self-pay | Admitting: *Deleted

## 2018-01-20 ENCOUNTER — Emergency Department (HOSPITAL_BASED_OUTPATIENT_CLINIC_OR_DEPARTMENT_OTHER)
Admission: EM | Admit: 2018-01-20 | Discharge: 2018-01-20 | Disposition: A | Discharge status: Home / Self Care | Payer: Self-pay | Attending: Emergency Medicine | Admitting: Emergency Medicine

## 2018-01-20 ENCOUNTER — Other Ambulatory Visit: Payer: Self-pay

## 2018-01-20 DIAGNOSIS — Z87891 Personal history of nicotine dependence: Secondary | ICD-10-CM | POA: Insufficient documentation

## 2018-01-20 DIAGNOSIS — Y69 Unspecified misadventure during surgical and medical care: Secondary | ICD-10-CM | POA: Insufficient documentation

## 2018-01-20 DIAGNOSIS — Z79899 Other long term (current) drug therapy: Secondary | ICD-10-CM | POA: Insufficient documentation

## 2018-01-20 DIAGNOSIS — T8131XA Disruption of external operation (surgical) wound, not elsewhere classified, initial encounter: Secondary | ICD-10-CM | POA: Insufficient documentation

## 2018-01-20 NOTE — ED Notes (Addendum)
Pt sitting in triage with eyes closed. Speech somewhat slurred. Answers questions with prompting. Denies current illicit drug use when asked, but states she has a prescription for medications post-op. States "I'm just trying to zone out because of the pain".

## 2018-01-20 NOTE — ED Provider Notes (Signed)
[facility] EMERGENCY DEPARTMENT Provider Note   CSN: [id number] Arrival date & time: 01/20/18  2002     History   Chief Complaint Chief Complaint  Patient presents with  . Wound Check    HPI Heather Day is a 32 y.o. female presenting to the emergency department with wound problem status post laparoscopic cholecystectomy on Tuesday.  States today she was ambulating more than she has been since the surgery, and noticed that her stitches popped open in the center of her abdomen just above her bellybutton.  Reports associated pain.  Denies drainage or fever.  Has had some persistent but gradually improving abdominal pain since the surgery.  Follow-up with surgeon was in December.   States she recently had her appendix out as well as multiple teeth removed in addition to her cholecystectomy in preparation for valve repair of her heart. The history is provided by the patient.    Past Medical History:  Diagnosis Date  . Anxiety    panic attacks  . Bipolar disorder (HCC)   . Drug abuse, IV (HCC)   . Dyspnea    when walking up stairs  . Gallstone   . GERD (gastroesophageal reflux disease)   . Heartburn   . Hepatitis C   . Periodontitis chronic, apical    dental caries  . PTSD (post-traumatic stress disorder)   . Severe tricuspid regurgitation   . Wears glasses     Patient Active Problem List   Diagnosis Date Noted  . Dental caries 11/02/2017  . Chronic periodontitis 11/02/2017  . Palpitations 09/04/2017  . Dizziness 09/04/2017  . Abdominal pain 03/16/2017  . Atypical chest pain 02/26/2017  . History of endocarditis 02/26/2017  . History of drug abuse (HCC) 02/26/2017  . Smoking 02/26/2017  . Severe tricuspid regurgitation     Past Surgical History:  Procedure Laterality Date  . CESAREAN SECTION    . CHOLECYSTECTOMY N/A 01/15/2018   Procedure: LAPAROSCOPIC CHOLECYSTECTOMY ERAS PATHWAY;  Surgeon: Glenna Fellows, MD;  Location: [facility] OR;  Service: General;   Laterality: N/A;  . INTRAOPERATIVE CHOLANGIOGRAM N/A 01/15/2018   Procedure: INTRAOPERATIVE CHOLANGIOGRAM;  Surgeon: Glenna Fellows, MD;  Location: [facility] OR;  Service: General;  Laterality: N/A;  . LAPAROSCOPIC APPENDECTOMY N/A 07/04/2017   Procedure: APPENDECTOMY LAPAROSCOPIC;  Surgeon: Luretha Murphy, MD;  Location: [facility] ORS;  Service: General;  Laterality: N/A;  . MULTIPLE EXTRACTIONS WITH ALVEOLOPLASTY Bilateral 11/29/2017   Procedure: Extraction of tooth #'s 2,5-15, and 31 with alveoloplasty and gross debridement of remaining dentition;  Surgeon: Charlynne Pander, DDS;  Location: [facility] OR;  Service: Oral Surgery;  Laterality: Bilateral;  . TEE WITHOUT CARDIOVERSION N/A 10/03/2017   Procedure: TRANSESOPHAGEAL ECHOCARDIOGRAM (TEE);  Surgeon: Jodelle Red, MD;  Location: [facility] ENDOSCOPY;  Service: Cardiovascular;  Laterality: N/A;     OB History   None      Home Medications    Prior to Admission medications   Medication Sig Start Date End Date Taking? Authorizing Provider  furosemide (LASIX) 40 MG tablet TAKE 1 TABLET (40 MG TOTAL) BY MOUTH DAILY. 01/15/18 04/15/18  [name], MD  furosemide (LASIX) 80 MG tablet Take 0.5 tablets (40 mg total) by mouth daily. 12/06/17 03/06/18  [name], MD  ibuprofen (ADVIL,MOTRIN) 200 MG tablet Take 600 mg by mouth every 6 (six) hours as needed for moderate pain.     [provider]  omeprazole (PRILOSEC) 20 MG capsule Take 1 capsule (20 mg total) by mouth 2 (two) times daily  before a meal. 03/16/17   [name], MD  oxyCODONE (OXY IR/ROXICODONE) 5 MG immediate release tablet Take 1 tablet (5 mg total) by mouth every 6 (six) hours as needed for moderate pain, severe pain or breakthrough pain. 01/15/18   Glenna Fellows, MD  potassium chloride (K-DUR,KLOR-CON) 10 MEQ tablet Take 10 mEq by mouth daily. 11/02/17   [provider]  potassium chloride (K-DUR,KLOR-CON) 10 MEQ tablet TAKE 1 TABLET (10 MEQ TOTAL) BY  MOUTH DAILY. 01/15/18   [name], MD  promethazine (PHENERGAN) 25 MG tablet Take 1 tablet (25 mg total) by mouth every 6 (six) hours as needed for nausea or vomiting. 11/04/17   Mesner, Barbara Cower, MD    Family History Family History  Problem Relation Age of Onset  . Hypothyroidism Mother   . Hypotension Mother   . Healthy Father     Social History Social History   Tobacco Use  . Smoking status: Former Smoker    Packs/day: 1.00    Years: 20.00    Pack years: 20.00    Types: Cigarettes    Last attempt to quit: 04/26/2017    Years since quitting: 0.7  . Smokeless tobacco: Never Used  Substance Use Topics  . Alcohol use: No  . Drug use: Not Currently    Types: IV, Heroin    Comment: clean since 10/09/16     Allergies   Patient has no known allergies.   Review of Systems Review of Systems  Constitutional: Negative for fever.  Skin: Positive for wound.     Physical Exam Updated Vital Signs BP 116/76 (BP Location: Left Arm)   Pulse (!) 110   Temp 98.1 F (36.7 C) (Oral)   Resp 16   LMP 12/30/2017   SpO2 100%   Physical Exam  Constitutional: She is oriented to person, place, and time. She appears well-developed and well-nourished. No distress.  Speech is mildly slurred, however patient has multiple missing upper front teeth  HENT:  Head: Normocephalic and atraumatic.  Eyes: Conjunctivae are normal.  Cardiovascular:  Slightly tachycardic  Pulmonary/Chest: Effort normal.  Abdominal: Soft. Bowel sounds are normal. She exhibits no distension.  There are 3 small surgical wounds to the abdomen, one in the right upper quadrant, and 2 in the epigastrium.  The lower surgical wound in the epigastrium is dehisced.  The wound is superficial.  There is no surrounding redness or purulence expressed.  There is some mild old bruising surrounding the surgical wound.  Abdomen is soft with some mild tenderness in the right upper quadrant.  Neurological: She is alert and  oriented to person, place, and time.  Psychiatric: She has a normal mood and affect. Her behavior is normal.  Nursing note and vitals reviewed.    ED Treatments / Results  Labs (all labs ordered are listed, but only abnormal results are displayed) Labs Reviewed - No data to display  EKG None  Radiology No results found.  Procedures Procedures (including critical care time)  Medications Ordered in ED Medications - No data to display   Initial Impression / Assessment and Plan / ED Course  I have reviewed the triage vital signs and the nursing notes.  Pertinent labs & imaging results that were available during my care of the patient were reviewed by me and considered in my medical decision making (see chart for details).     Patient presenting to the emergency department with wound dehiscence of a surgical wound after laparoscopic cholecystectomy on Tuesday.  Wound does not appear infected.  No systemic symptoms.  Patient states her abdominal pain has been gradually improving since the surgery.  Has follow-up with her surgeon in December.   Suspect patient's mild slurred speech is secondary to recent multiple tooth removal of her upper teeth as well as the narcotics she is prescribed for pain.  Low suspicion for any acute pathology regarding slurred speech. Discussed wound care and follow-up with surgeon.  Discussed signs of infections and when to report back to the emergency department.  Patient agreeable to plan and witnessed ambulating in the ED without issue.  Patient discussed with Dr. Anitra Lauth who agrees with care plan.  Discussed results, findings, treatment and follow up. Patient advised of return precautions. Patient verbalized understanding and agreed with plan.  Final Clinical Impressions(s) / ED Diagnoses   Final diagnoses:  Postoperative wound dehiscence, initial encounter    ED Discharge Orders    None       Jomar Denz, [place] N, PA-C 01/20/18 2229      Gwyneth Sprout, MD 01/22/18 1640

## 2018-01-20 NOTE — Discharge Instructions (Signed)
Keep your wound clean and covered. Follow-up with your surgeon. Return to the emergency department for signs of infection, including redness surrounding the wound, purulent drainage, fever.

## 2018-01-20 NOTE — ED Notes (Signed)
Pt lethargic with slurred speech on exam.

## 2018-01-20 NOTE — ED Triage Notes (Signed)
Pt states she had her GB removed on Tuesday (Laparoscopic). States her stitches popped open 20 minutes pta

## 2018-01-25 ENCOUNTER — Ambulatory Visit: Payer: Self-pay | Admitting: Cardiology

## 2018-01-25 DIAGNOSIS — R0989 Other specified symptoms and signs involving the circulatory and respiratory systems: Secondary | ICD-10-CM

## 2018-02-06 MED FILL — OMEPRAZOLE 20 MG CPDR: 20 | 30 days supply | Qty: 60 | Fill #4

## 2018-02-28 ENCOUNTER — Ambulatory Visit (INDEPENDENT_AMBULATORY_CARE_PROVIDER_SITE_OTHER): Payer: Self-pay | Admitting: Cardiology

## 2018-02-28 ENCOUNTER — Encounter: Payer: Self-pay | Admitting: Cardiology

## 2018-02-28 VITALS — BP 110/60 | HR 96 | Wt 310.4 lb

## 2018-02-28 DIAGNOSIS — F1911 Other psychoactive substance abuse, in remission: Secondary | ICD-10-CM

## 2018-02-28 DIAGNOSIS — Z8679 Personal history of other diseases of the circulatory system: Secondary | ICD-10-CM

## 2018-02-28 DIAGNOSIS — R0789 Other chest pain: Secondary | ICD-10-CM

## 2018-02-28 DIAGNOSIS — I071 Rheumatic tricuspid insufficiency: Secondary | ICD-10-CM

## 2018-02-28 NOTE — Progress Notes (Signed)
Cardiology Office Note:    Date:  02/28/2018   ID:  Heather LotAshly Duignan, DOB 01/01/1986, MRN [id number]  PCP:  Patient, No Pcp Per  Cardiologist:  Gypsy Balsamobert Cheney Gosch, MD    Referring MD: No ref. provider found   Chief Complaint  Patient presents with  . Follow-up  Doing well  History of Present Illness:    Heather Day is a 32 y.o. female with severe tricuspid regurgitation which is secondary to endocarditis that she suffered years ago.  She was scheduled to have surgery to replace her tricuspid valve then however signed AMA and never happened lately she started experiencing more symptoms and she did see Dr. Barry Dieneswens for consideration of tricuspid valve replacement however because of her poor dental hygiene she was referred to dental surgeon for removal of her teeth which already has been done she does have dentures, she also had gallstones and those were removed more a month ago she is doing well.  She described to have some swelling periodically that is controlled with diuretic also shortness of breath and some distention in the right upper portion of the abdomen.  I will send message to Dr. Barry Dieneswens that from my point of view she is ready to proceed electively with tricuspid repair  Past Medical History:  Diagnosis Date  . Anxiety    panic attacks  . Bipolar disorder (HCC)   . Drug abuse, IV (HCC)   . Dyspnea    when walking up stairs  . Gallstone   . GERD (gastroesophageal reflux disease)   . Heartburn   . Hepatitis C   . Periodontitis chronic, apical    dental caries  . PTSD (post-traumatic stress disorder)   . Severe tricuspid regurgitation   . Wears glasses     Past Surgical History:  Procedure Laterality Date  . CESAREAN SECTION    . CHOLECYSTECTOMY N/A 01/15/2018   Procedure: LAPAROSCOPIC CHOLECYSTECTOMY ERAS PATHWAY;  Surgeon: Glenna FellowsHoxworth, Benjamin, MD;  Location: [facility] OR;  Service: General;  Laterality: N/A;  . INTRAOPERATIVE CHOLANGIOGRAM N/A 01/15/2018   Procedure: INTRAOPERATIVE  CHOLANGIOGRAM;  Surgeon: Glenna FellowsHoxworth, Benjamin, MD;  Location: [facility] OR;  Service: General;  Laterality: N/A;  . LAPAROSCOPIC APPENDECTOMY N/A 07/04/2017   Procedure: APPENDECTOMY LAPAROSCOPIC;  Surgeon: Luretha MurphyMartin, Matthew, MD;  Location: [facility] ORS;  Service: General;  Laterality: N/A;  . MULTIPLE EXTRACTIONS WITH ALVEOLOPLASTY Bilateral 11/29/2017   Procedure: Extraction of tooth #'s 2,5-15, and 31 with alveoloplasty and gross debridement of remaining dentition;  Surgeon: Charlynne PanderKulinski, Ronald F, DDS;  Location: [facility] OR;  Service: Oral Surgery;  Laterality: Bilateral;  . TEE WITHOUT CARDIOVERSION N/A 10/03/2017   Procedure: TRANSESOPHAGEAL ECHOCARDIOGRAM (TEE);  Surgeon: Jodelle Redhristopher, Bridgette, MD;  [facility];  Service: Cardiovascular;  Laterality: N/A;    Current Medications: No outpatient medications have been marked as taking for the 02/28/18 encounter (Office Visit) with Georgeanna LeaKrasowski, Jasmyn Picha J, MD.     Allergies:   Patient has no known allergies.   Social History   Socioeconomic History  . Marital status: Single    Spouse name: Not on file  . Number of children: Not on file  . Years of education: Not on file  . Highest education level: Not on file  Occupational History  . Not on file  Social Needs  . Financial resource strain: Not on file  . Food insecurity:    Worry: Not on file    Inability: Not on file  . Transportation needs:    Medical: Not on file  Non-medical: Not on file  Tobacco Use  . Smoking status: Former Smoker    Packs/day: 1.00    Years: 20.00    Pack years: 20.00    Types: Cigarettes    Last attempt to quit: 04/26/2017    Years since quitting: 0.8  . Smokeless tobacco: Never Used  Substance and Sexual Activity  . Alcohol use: No  . Drug use: Not Currently    Types: IV, Heroin    Comment: clean since 10/09/16  . Sexual activity: Not on file  Lifestyle  . Physical activity:    Days per week: Not on file    Minutes per session: Not on file  . Stress: Not on  file  Relationships  . Social connections:    Talks on phone: Not on file    Gets together: Not on file    Attends religious service: Not on file    Active member of club or organization: Not on file    Attends meetings of clubs or organizations: Not on file    Relationship status: Not on file  Other Topics Concern  . Not on file  Social History Narrative  . Not on file     Family History: The patient's family history includes Healthy in her father; Hypotension in her mother; Hypothyroidism in her mother. ROS:   Please see the history of present illness.    All 14 point review of systems negative except as described per history of present illness  EKGs/Labs/Other Studies Reviewed:      Recent Labs: 03/14/2017: B Natriuretic Peptide 34.9 09/04/2017: NT-Pro BNP 242 01/07/2018: ALT 16; BUN 11; Creatinine, Ser 0.73; Hemoglobin 13.8; Platelets 294; Potassium 4.0; Sodium 136  Recent Lipid Panel    Component Value Date/Time   CHOL 214 (H) 02/26/2017 1425   TRIG 184 (H) 02/26/2017 1425   HDL 45 02/26/2017 1425   CHOLHDL 4.8 (H) 02/26/2017 1425   LDLCALC 132 (H) 02/26/2017 1425    Physical Exam:    VS:  BP 110/60   Pulse 96   Wt (!) 310 lb 6.4 oz (140.8 kg)   SpO2 98%   BMI 47.20 kg/m     Wt Readings from Last 3 Encounters:  02/28/18 (!) 310 lb 6.4 oz (140.8 kg)  01/15/18 (!) 310 lb (140.6 kg)  01/07/18 (!) 310 lb (140.6 kg)     GEN:  Well nourished, well developed in no acute distress HEENT: Normal NECK: No JVD; No carotid bruits LYMPHATICS: No lymphadenopathy CARDIAC: RRR, systolic murmur grade 2/6 best heard right upper portion of the sternum, no rubs, no gallops RESPIRATORY:  Clear to auscultation without rales, wheezing or rhonchi  ABDOMEN: Soft, non-tender, non-distended MUSCULOSKELETAL:  No edema; No deformity  SKIN: Warm and dry LOWER EXTREMITIES: no swelling NEUROLOGIC:  Alert and oriented x 3 PSYCHIATRIC:  Normal affect   ASSESSMENT:    1. Severe  tricuspid regurgitation   2. Atypical chest pain   3. History of drug abuse (HCC)   4. History of endocarditis    PLAN:    In order of problems listed above:  1. Severe tricuspid regurgitation: Will be scheduled to have elective tricuspid valve repair. 2. Atypical chest pain denies having any 3. History of drug abuse.  Staying clean 4. History of endocarditis.  Noted.   Medication Adjustments/Labs and Tests Ordered: Current medicines are reviewed at length with the patient today.  Concerns regarding medicines are outlined above.  No orders of the defined types were placed in  this encounter.  Medication changes: No orders of the defined types were placed in this encounter.   Signed, [name], MD, [facility] 02/28/2018 3:52 PM    [facility]

## 2018-02-28 NOTE — Patient Instructions (Signed)
Medication Instructions:  Your physician recommends that you continue on your current medications as directed. Please refer to the Current Medication list given to you today.  If you need a refill on your cardiac medications before your next appointment, please call your pharmacy.   Lab work: None.  If you have labs (blood work) drawn today and your tests are completely normal, you will receive your results only by: . MyChart Message (if you have MyChart) OR . A paper copy in the mail If you have any lab test that is abnormal or we need to change your treatment, we will call you to review the results.  Testing/Procedures: None.    Follow-Up: At [facility], you and your health needs are our priority.  As part of our continuing mission to provide you with exceptional heart care, we have created designated Provider Care Teams.  These Care Teams include your primary Cardiologist (physician) and Advanced Practice Providers (APPs -  Physician Assistants and Nurse Practitioners) who all work together to provide you with the care you need, when you need it. You will need a follow up appointment in 3 months.  Please call our office 2 months in advance to schedule this appointment.  You may see No primary care provider on file. or another member of our [facility] Provider Team in [place]: Brian Munley, MD . Rajan Revankar, MD  Any Other Special Instructions Will Be Listed Below (If Applicable).     

## 2018-03-27 ENCOUNTER — Ambulatory Visit: Payer: Self-pay | Admitting: Thoracic Surgery (Cardiothoracic Vascular Surgery)

## 2018-03-28 ENCOUNTER — Telehealth (HOSPITAL_COMMUNITY): Payer: Self-pay | Admitting: Emergency Medicine

## 2018-03-28 ENCOUNTER — Encounter: Payer: Self-pay | Admitting: Thoracic Surgery (Cardiothoracic Vascular Surgery)

## 2018-03-28 ENCOUNTER — Ambulatory Visit (INDEPENDENT_AMBULATORY_CARE_PROVIDER_SITE_OTHER): Payer: Self-pay | Admitting: Thoracic Surgery (Cardiothoracic Vascular Surgery)

## 2018-03-28 ENCOUNTER — Other Ambulatory Visit: Payer: Self-pay | Admitting: *Deleted

## 2018-03-28 VITALS — BP 135/90 | HR 84 | Resp 20 | Ht 68.0 in | Wt 315.0 lb

## 2018-03-28 DIAGNOSIS — I071 Rheumatic tricuspid insufficiency: Secondary | ICD-10-CM

## 2018-03-28 DIAGNOSIS — Z01818 Encounter for other preprocedural examination: Secondary | ICD-10-CM

## 2018-03-28 NOTE — Telephone Encounter (Signed)
Calling patient to schedule coronary CT  On schedule for Monday 04/01/18 at 07:45a Rockwell AlexandriaSara Bunnie Lederman RN Navigator Cardiac Imaging [facility] [phone] Office  [phone] Cell

## 2018-03-28 NOTE — Progress Notes (Signed)
[address]       [place] [id number]             [phone]     CARDIOTHORACIC SURGERY OFFICE NOTE  Referring Provider is Georgeanna LeaKrasowski, Robert J, MD PCP is Patient, No Pcp Per   HPI:  Patient is a 33 year old morbidly obese female with history of IV drug abuse in remission, severe tricuspid regurgitation secondary to bacterial endocarditis, cholelithiasis, and hepatitis C who returns to the office today for follow-up of stage D severe symptomatic tricuspid regurgitation.  She was originally seen in consultation on October 30, 2017.  Since then she underwent dental extraction by Dr. Robin SearingKulinsky, and more recently she underwent laparoscopic cholecystectomy by Dr. Johna SheriffHoxworth.  She recovered from both of these uneventfully, and she was recently seen in follow-up by Dr. Bing MatterKrasowski.  She has developed worsening symptoms of exertional shortness of breath, for which her dose of Lasix has recently been increased.  She has been referred back to our office today for follow-up to further discuss possible elective tricuspid valve repair or replacement.  She states that her breathing is comfortable at rest but she gets short of breath with moderate level activity.  She has had some increased shortness of breath, fluid retention, and mild lower extremity edema.  She denies any fevers, chills, or productive cough.  She has not had any abdominal pain.  She does not smoke cigarettes and she has been drug-free since July 2018.  The remainder of her review of systems is unremarkable.   Current Outpatient Medications  Medication Sig Dispense Refill  . furosemide (LASIX) 80 MG tablet Take 0.5 tablets (40 mg total) by mouth daily. 60 tablet 1  . ibuprofen (ADVIL,MOTRIN) 200 MG tablet Take 600 mg by mouth every 6 (six) hours as needed for moderate pain.     Marland Kitchen. omeprazole (PRILOSEC) 20 MG capsule Take 1 capsule (20 mg total) by mouth 2 (two) times daily before a meal. 60 capsule 11  . potassium chloride  (K-DUR,KLOR-CON) 10 MEQ tablet Take 10 mEq by mouth daily.  2   No current facility-administered medications for this visit.       Physical Exam:   BP 135/90   Pulse 84   Resp 20   Ht 5\' 8"  (1.727 m)   Wt (!) 315 lb (142.9 kg)   SpO2 97% Comment: RA  BMI 47.90 kg/m   General:  Morbidly obese but well-appearing  Chest:   Clear to auscultation  CV:   Regular rate and rhythm  Incisions:  Small incisions on abdominal wall from laparoscopic cholecystectomy have healed nicely  Abdomen:  Soft nontender  Extremities:  Warm and well-perfused with trace lower extremity edema  Diagnostic Tests:  n/a   Impression:  Patient has stage D severe symptomatic tricuspid regurgitation.  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  She claims to have remained abstinent from all illicit drug use since July 2018.  She continues to experience symptoms of right-sided heart failure with severe swelling, volume retention, abdominal bloating, and exertional shortness of breath.    I agree that it would be reasonable to consider elective tricuspid valve repair or replacement.  Based upon review of the patient's prevous TEE I feel there is a significant chance that her valve may be repairable.  She is clearly symptomatic and without some type of surgical intervention she will likely develop progressive right heart dysfunction eventually  over time.    She has not had an echocardiogram since last July.  The patient gives a convincing history that she has remained abstinent from any type of illicit drug use.     Plan:  I have again reviewed the indications, risk, and potential benefits of tricuspid valve repair or replacement with the patient in the office today.  Because of the patient's morbid obesity and body habitus I would not consider a candidate for minimally invasive approach for surgery.  She understands that surgical intervention will  require conventional median sternotomy.  She understands that there remains a possibility that her valve would not be repairable in which case it would need to be replaced using a bioprosthetic tissue valve.  She understands that there is some risk of developing complete heart block and possible need for permanent pacemaker placement.  Expectations regarding her postoperative convalescence have been discussed.  The patient hopes to proceed with surgery in the near future.  We tentatively plan to proceed with surgery on April 18, 2018.   As a next step the patient will need to undergo follow-up transthoracic echocardiogram to reassess right ventricular function.  Depending on findings she may need right heart catheterization performed.  We will perform cardiac gated CT angiogram of the heart to evaluate the coronary arteries.  I do not feel that left heart catheterization will need to be performed unless her CT angiogram is abnormal.  Patient will return to our office on April 15, 2018 to review the results of these tests and make final plans for surgery.  All of her questions have been answered.   I spent in excess of 15 minutes during the conduct of this office consultation and >50% of this time involved direct face-to-face encounter with the patient for counseling and/or coordination of their care.   Salvatore Decent. Cornelius Moras, MD 03/28/2018 2:50 PM

## 2018-03-28 NOTE — Patient Instructions (Addendum)
Continue all previous medications without any changes at this time  Check your weight on a regular basis and keep a log for your records.  Look for signs of fluid overload such as worsening swelling of your lower legs, increased shortness of breath with activity, and/or a dry nonproductive cough.  Discussed these findings with your cardiologist including whether or not you should adjust your fluid pill dosage (diuretic).   

## 2018-03-29 ENCOUNTER — Telehealth (HOSPITAL_COMMUNITY): Payer: Self-pay | Admitting: Emergency Medicine

## 2018-03-29 ENCOUNTER — Encounter: Payer: Self-pay | Admitting: *Deleted

## 2018-03-29 ENCOUNTER — Telehealth: Payer: Self-pay

## 2018-03-29 MED ORDER — METOPROLOL TARTRATE 100 MG PO TABS: ORAL_TABLET | ORAL | 0 refills | Status: DC

## 2018-03-29 NOTE — Telephone Encounter (Signed)
-----   Message from Davina Poke, RN sent at 03/29/2018 10:43 AM EST ----- Ronna Polio,  From Dr. Cornelius Moras:   Can you order 100mg  metoprolol for this patient to pick up and take 2 hr prior to CT scan on Monday? Her HR in office was 83, goal is 55-65 for a coronary scan.  Her scan in Monday 1/20.  Thanks, [employer]

## 2018-03-29 NOTE — Telephone Encounter (Signed)
Patient contacted, advised per Dr. Cornelius Moras that she needed to take Metoprolol, 100 mg tablet, 2 hours prior to her CT scan on Monday, 04/01/2018.  Prescription sent into preferred pharmacy, [facility] on [address] in [place].

## 2018-03-29 NOTE — Telephone Encounter (Signed)
Reaching out to patient to offer assistance regarding upcoming cardiac imaging study; pt verbalizes understanding of appt date/time, parking situation and where to check in, pre-test NPO status and medications ordered, and verified current allergies; name and call back number provided for further questions should they arise Rockwell Alexandria RN Navigator Cardiac Imaging [facility] [phone] office [phone] cell  Pt is a difficult IV stick

## 2018-04-01 ENCOUNTER — Other Ambulatory Visit: Payer: Self-pay

## 2018-04-01 ENCOUNTER — Ambulatory Visit (HOSPITAL_COMMUNITY)
Admission: RE | Admit: 2018-04-01 | Discharge: 2018-04-01 | Disposition: A | Discharge status: Home / Self Care | Payer: Self-pay | Source: Ambulatory Visit | Attending: Thoracic Surgery (Cardiothoracic Vascular Surgery) | Admitting: Thoracic Surgery (Cardiothoracic Vascular Surgery)

## 2018-04-01 ENCOUNTER — Ambulatory Visit (HOSPITAL_BASED_OUTPATIENT_CLINIC_OR_DEPARTMENT_OTHER): Payer: Self-pay

## 2018-04-01 DIAGNOSIS — I071 Rheumatic tricuspid insufficiency: Secondary | ICD-10-CM | POA: Insufficient documentation

## 2018-04-01 DIAGNOSIS — Z01818 Encounter for other preprocedural examination: Secondary | ICD-10-CM

## 2018-04-01 DIAGNOSIS — I361 Nonrheumatic tricuspid (valve) insufficiency: Secondary | ICD-10-CM

## 2018-04-01 LAB — ECHOCARDIOGRAM COMPLETE

## 2018-04-01 MED ORDER — IOPAMIDOL (ISOVUE-370) INJECTION 76%
80.0000 mL | Freq: Once | INTRAVENOUS | Status: AC | PRN
  Administered 2018-04-01: 80 mL via INTRAVENOUS

## 2018-04-01 MED ORDER — NITROGLYCERIN 0.4 MG SL SUBL
0.8000 mg | SUBLINGUAL_TABLET | SUBLINGUAL | Status: DC | PRN
  Administered 2018-04-01: 0.8 mg via SUBLINGUAL
  Filled 2018-04-01 (×2): qty 25

## 2018-04-01 MED ORDER — NITROGLYCERIN 0.4 MG SL SUBL
SUBLINGUAL_TABLET | SUBLINGUAL | Status: AC
  Filled 2018-04-01: qty 2

## 2018-04-10 ENCOUNTER — Encounter: Payer: Self-pay | Admitting: Thoracic Surgery (Cardiothoracic Vascular Surgery)

## 2018-04-10 ENCOUNTER — Ambulatory Visit (INDEPENDENT_AMBULATORY_CARE_PROVIDER_SITE_OTHER): Payer: Self-pay | Admitting: Thoracic Surgery (Cardiothoracic Vascular Surgery)

## 2018-04-10 VITALS — BP 122/82 | HR 96 | Resp 20 | Ht 68.0 in | Wt 315.0 lb

## 2018-04-10 DIAGNOSIS — I071 Rheumatic tricuspid insufficiency: Secondary | ICD-10-CM

## 2018-04-10 NOTE — Patient Instructions (Signed)
   Continue taking all current medications without change through the day before surgery.  Have nothing to eat or drink after midnight the night before surgery.  On the morning of surgery take only Prilosec with a sip of water.    

## 2018-04-10 NOTE — Anesthesia Preprocedure Evaluation (Addendum)
Anesthesia Evaluation  Patient identified by MRN, date of birth, ID band Patient awake    Reviewed: Allergy & Precautions, NPO status , Patient's Chart, lab work & pertinent test results  Airway Mallampati: I  TM Distance: >3 FB Neck ROM: Full    Dental  (+) Edentulous Upper   Pulmonary former smoker,    Pulmonary exam normal breath sounds clear to auscultation       Cardiovascular + Valvular Problems/Murmurs  Rhythm:Regular Rate:Normal + Diastolic murmurs ECG: NSR, rate 82. Normal sinus rhythm Nonspecific T wave abnormality  ECHO:  Left ventricle: The cavity size was normal. Wall thickness was normal. Systolic function was normal. The estimated ejection fraction was in the range of 50% to 55%. Wall motion was normal; there were no regional wall motion abnormalities. Left ventricular diastolic function parameters were normal. Right ventricle: The cavity size was mildly dilated. Right atrium: The atrium was moderately dilated. Tricuspid valve: There was severe regurgitation. Impressions: Normal LV function; severe TR; moderate RAE; mild RVE.     Neuro/Psych PSYCHIATRIC DISORDERS Anxiety Bipolar Disorder PTSD (post-traumatic stress disorder)negative neurological ROS     GI/Hepatic GERD  Medicated and Controlled,(+)     substance abuse  IV drug use, Hepatitis -, C  Endo/Other  Morbid obesity  Renal/GU negative Renal ROS     Musculoskeletal negative musculoskeletal ROS (+) narcotic dependent  Abdominal (+) + obese,   Peds  Hematology negative hematology ROS (+)   Anesthesia Other Findings Severe TR  Reproductive/Obstetrics                           Anesthesia Physical Anesthesia Plan  ASA: IV  Anesthesia Plan: General   Post-op Pain Management:    Induction: Intravenous  PONV Risk Score and Plan: 3 and Midazolam, Dexamethasone, Ondansetron and Treatment may vary due to age or  medical condition  Airway Management Planned: Oral ETT  Additional Equipment: Arterial line, CVP, TEE and Ultrasound Guidance Line Placement  Intra-op Plan: Utilization Of Total Body Hypothermia per surgeon request  Post-operative Plan: Post-operative intubation/ventilation  Informed Consent: I have reviewed the patients History and Physical, chart, labs and discussed the procedure including the risks, benefits and alternatives for the proposed anesthesia with the patient or authorized representative who has indicated his/her understanding and acceptance.     Dental advisory given  Plan Discussed with: CRNA  Anesthesia Plan Comments: (Per Dr. Cornelius Moras:  No Swan-Ganz   ALL NECK LINES ON LEFT   NO NECK LINES ON RIGHT )    Anesthesia Quick Evaluation

## 2018-04-10 NOTE — Progress Notes (Signed)
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  She was originally seen in consultation on October 30, 2017 and she was last seen here in our office recently on March 28, 2018.  Since then she underwent follow-up echocardiogram and coronary CT angiography.  Echocardiogram revealed normal left ventricular systolic function with only mild right ventricular chamber enlargement and normal right ventricular function.  There remains severe tricuspid regurgitation.  No other significant abnormalities were noted.  Coronary CT angiography revealed normal coronary artery anatomy with no sign of any significant coronary artery disease.  Patient returns for office today with tentative plans to proceed with elective mitral valve repair or replacement next week.  She reports no new problems or complaints.   Current Outpatient Medications  Medication Sig Dispense Refill  . furosemide (LASIX) 80 MG tablet Take 0.5 tablets (40 mg total) by mouth daily. (Patient taking differently: Take 80 mg by mouth daily. ) 60 tablet 1  . ibuprofen (ADVIL,MOTRIN) 200 MG tablet Take 600 mg by mouth every 6 (six) hours as needed for moderate pain.     . methadone (DOLOPHINE) 10 MG/5ML solution Take 80 mg by mouth daily.     . metoprolol tartrate (LOPRESSOR) 100 MG tablet Please take 100 mg once 2 hours prior to procedure. 1 tablet 0  . omeprazole (PRILOSEC) 20 MG capsule Take 1 capsule (20 mg total) by mouth 2 (two) times daily before a meal. 60 capsule 11  . potassium chloride  (K-DUR,KLOR-CON) 10 MEQ tablet Take 10 mEq by mouth daily.  2   No current facility-administered medications for this visit.       Physical Exam:   BP 122/82   Pulse 96   Resp 20   Ht 5\' 8"  (1.727 m)   Wt (!) 315 lb (142.9 kg)   LMP 03/14/2018   SpO2 97% Comment: RA  BMI 47.90 kg/m   General:  Morbidly obese but well-appearing  Chest:   Clear to auscultation  CV:   Regular rate and rhythm with systolic murmur  Incisions:  n/a  Abdomen:  Soft nontender  Extremities:  Warm and well-perfused, mild edema  Diagnostic Tests:  Transthoracic Echocardiography  Patient:    Heather Day, Heather Day MR #:       [id number] Study Date: 04/01/2018 Gender:     F Age:        32 Height:     172.7 cm Weight:     142.9 kg BSA:        2.7 m^2 Pt. Status: Room:   ATTENDING    Olga Millers  [facility]     Tressie Stalker, M.D.  REFERRING    Tressie Stalker, M.D.  SONOGRAPHER  Clearence Ped, RCS  PERFORMING   [facility], Outpatient  cc:  ------------------------------------------------------------------- LV EF: 50% -   55%  ------------------------------------------------------------------- Indications:      Severe Tricuspid regurgitation (O07.1).  ------------------------------------------------------------------- History:   PMH:  history of drug abuse, history of endocarditis. Pre-op clearance. Acquired from the  patient and from the patient&'s chart.  ------------------------------------------------------------------- Study Conclusions  - Left ventricle: The cavity size was normal. Wall thickness was   normal. Systolic function was normal. The estimated ejection   fraction was in the range of 50% to 55%. Wall motion was normal;   there were no regional wall motion abnormalities. Left   ventricular diastolic function parameters were normal. - Right ventricle: The cavity size was mildly dilated. - Right atrium: The atrium was moderately dilated. - Tricuspid valve: There was severe  regurgitation.  Impressions:  - Normal LV function; severe TR; moderate RAE; mild RVE.  ------------------------------------------------------------------- Study data:  Comparison was made to the study of 09/24/2017.  Study status:  Routine.  Procedure:  The patient reported no pain pre or post test. Transthoracic echocardiography. Image quality was adequate.          Transthoracic echocardiography.  M-mode, complete 2D, spectral Doppler, and color Doppler.  Birthdate: Patient birthdate: 10/25/1985.  Age:  Patient is 33 yr old.  Sex: Gender: female.    BMI: 47.9 kg/m^2.  Blood pressure:     111/66 Patient status:  Outpatient.  Study date:  Study date: 04/01/2018. Study time: 10:34 AM.  Location:  [facility]  -------------------------------------------------------------------  ------------------------------------------------------------------- Left ventricle:  The cavity size was normal. Wall thickness was normal. Systolic function was normal. The estimated ejection fraction was in the range of 50% to 55%. Wall motion was normal; there were no regional wall motion abnormalities. Left ventricular diastolic function parameters were normal.  ------------------------------------------------------------------- Aortic valve:   Trileaflet; normal thickness leaflets. Mobility was not restricted.  Doppler:  Transvalvular velocity was within the normal range. There was no stenosis. There was no regurgitation.   ------------------------------------------------------------------- Aorta:  Aortic root: The aortic root was normal in size.  ------------------------------------------------------------------- Mitral valve:   Structurally normal valve.   Mobility was not restricted.  Doppler:  Transvalvular velocity was within the normal range. There was no evidence for stenosis. There was no regurgitation.  ------------------------------------------------------------------- Left  atrium:  The atrium was normal in size.  ------------------------------------------------------------------- Right ventricle:  The cavity size was mildly dilated. Systolic function was normal.  ------------------------------------------------------------------- Pulmonic valve:    Doppler:  Transvalvular velocity was within the normal range. There was no evidence for stenosis. There was trivial regurgitation.  ------------------------------------------------------------------- Tricuspid valve:   Structurally normal valve.    Doppler: Transvalvular velocity was within the normal range. There was severe regurgitation.  ------------------------------------------------------------------- Pulmonary artery:   Systolic pressure was within the normal range.   ------------------------------------------------------------------- Right atrium:  The atrium was moderately dilated.  ------------------------------------------------------------------- Pericardium:  There was no pericardial effusion.  ------------------------------------------------------------------- Systemic veins: Inferior vena cava: The vessel was normal in size. The respirophasic diameter changes were in the normal range (= 50%), consistent with normal central venous pressure. Diameter: 19 mm.  ------------------------------------------------------------------- Measurements   IVC                                        Value        Reference  ID                                         19    mm     ----------    Left ventricle  Value        Reference  LV ID, ED, PLAX chordal                    49.8  mm     43 - 52  LV ID, ES, PLAX chordal                    36.8  mm     23 - 38  LV fx shortening, PLAX chordal     (L)     26    %      >=29  LV PW thickness, ED                        11.7  mm     ----------  IVS/LV PW ratio, ED                        0.68         <=1.3  Stroke volume, 2D                           65    ml     ----------  Stroke volume/bsa, 2D                      24    ml/m^2 ----------  LV e&', lateral                             13.9  cm/s   ----------  LV E/e&', lateral                           4.22         ----------  LV e&', medial                              10.7  cm/s   ----------  LV E/e&', medial                            5.49         ----------  LV e&', average                             12.3  cm/s   ----------  LV E/e&', average                           4.77         ----------    Ventricular septum                         Value        Reference  IVS thickness, ED                          7.97  mm     ----------    LVOT  Value        Reference  LVOT ID, S                                 22    mm     ----------  LVOT area                                  3.8   cm^2   ----------  LVOT peak velocity, S                      77.6  cm/s   ----------  LVOT mean velocity, S                      53.8  cm/s   ----------  LVOT VTI, S                                17.1  cm     ----------    Aorta                                      Value        Reference  Aortic root ID, ED                         33    mm     ----------    Left atrium                                Value        Reference  LA ID, A-P, ES                             42    mm     ----------  LA ID/bsa, A-P                             1.56  cm/m^2 <=2.2  LA volume, S                               40.9  ml     ----------  LA volume/bsa, S                           15.2  ml/m^2 ----------  LA volume, ES, 1-p A4C                     54.4  ml     ----------  LA volume/bsa, ES, 1-p A4C                 20.2  ml/m^2 ----------  LA volume, ES, 1-p A2C                     28.6  ml     ----------  LA volume/bsa, ES, 1-p A2C  10.6  ml/m^2 ----------    Mitral valve                               Value        Reference  Mitral E-wave peak velocity                 58.7  cm/s   ----------  Mitral A-wave peak velocity                56.8  cm/s   ----------  Mitral deceleration time           (H)     296   ms     150 - 230  Mitral E/A ratio, peak                     1            ----------    Pulmonary arteries                         Value        Reference  PA pressure, S, DP                         28    mm Hg  <=30    Tricuspid valve                            Value        Reference  Tricuspid regurg peak velocity             250   cm/s   ----------  Tricuspid peak RV-RA gradient              25    mm Hg  ----------  Tricuspid maximal regurg velocity,         250   cm/s   ----------  PISA    Right atrium                               Value        Reference  RA ID, S-I, ES, A4C                (H)     65    mm     34 - 49  RA area, ES, A4C                   (H)     26.4  cm^2   8.3 - 19.5  RA volume, ES, A/L                         89.3  ml     ----------  RA volume/bsa, ES, A/L                     33.1  ml/m^2 ----------    Systemic veins                             Value        Reference  Estimated CVP  3     mm Hg  ----------    Right ventricle                            Value        Reference  TAPSE                                      17.5  mm     ----------  RV pressure, S, DP                         28    mm Hg  <=30  RV s&', lateral, S                          14    cm/s   ----------  Legend: (L)  and  (H)  mark values outside specified reference range.  ------------------------------------------------------------------- Prepared and Electronically Authenticated by  Olga Millers 2020-01-20T13:20:05    Cardiac/Coronary  CT  TECHNIQUE: The patient was scanned on a [employer].  FINDINGS: A 120 kV prospective scan was triggered in the descending thoracic aorta at 111 HU's. Axial non-contrast 3 mm slices were carried out through the heart. The data set was analyzed on  a dedicated work station and scored using the Agatson method. Gantry rotation speed was 250 msecs and collimation was .6 mm. No beta blockade and 0.8 mg of sl NTG was given. The 3D data set was reconstructed in 5% intervals of the 67-82 % of the R-R cycle. Diastolic phases were analyzed on a dedicated work station using MPR, MIP and VRT modes. The patient received 80 cc of contrast.  Limited quality scan secondary to wrong contrast timing.  Aorta:  Normal size.  No calcifications.  No dissection.  Aortic Valve:  Trileaflet.  No calcifications.  Coronary Arteries:  Normal coronary origin.  Right dominance.  RCA is a large dominant artery that gives rise to PDA and PLVB. There is no plaque.  Left main is a large artery that gives rise to LAD and LCX arteries.  LAD is a large vessel that has no plaque.  LCX is a non-dominant artery that gives rise to one large OM1 branch. There is no plaque.  Other findings:  Normal pulmonary vein drainage into the left atrium.  Normal let atrial appendage without a thrombus.  Normal size of the pulmonary artery.  IMPRESSION: 1. Coronary calcium score of 0. This was 0 percentile for age and sex matched control.  2. Normal coronary origin with right dominance.  3. No evidence of CAD.  4. Mildly dilated pulmonary artery measuring 31 mm suggestive of pulmonary hypertension.  5.  Severely dilated right atrium.   Electronically Signed   By: Tobias Alexander   On: 04/01/2018 18:04        Impression:  Patient has stage D severe symptomatic tricuspid regurgitation. 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 She claims to have remained abstinent from all illicit drug use since July 2018. She continues to experience symptoms of right-sided heart failure with severe swelling, volume retention, abdominal bloating, and exertional shortness of breath.    I agree that it would be reasonable to consider elective tricuspid valve repair or replacement. Based  upon review of the patient's prevous TEE I feel there is a significant chance that her valve may be repairable.She is clearly symptomatic and without some type of surgical intervention she will likely develop progressive right heart dysfunction eventually over time.  The patient gives a convincing history that she has remained abstinent from any type of illicit drug use.  Recent follow-up transthoracic echocardiogram reveals normal left ventricular systolic function with mild right ventricular chamber enlargement but preserved right ventricular function.  There is severe tricuspid regurgitation with no other complicating features.  Coronary angiography reveals normal coronary artery anatomy with right dominant coronary circulation and no significant coronary artery disease.    Plan:  I have again reviewed the indications, risk, and potential benefits of tricuspid valve repair or replacement with the patient in the office today.    Alternative treatment strategies have been discussed.  Because of the patient's morbid obesity and body habitus I would not consider a candidate for minimally invasive approach for surgery.  She understands that surgical intervention will require conventional median sternotomy.  She understands that there remains a possibility that her valve would not be repairable in which case it would need to be replaced using a bioprosthetic tissue valve.  She understands that there is some risk of developing complete heart block and possible need for permanent pacemaker placement.  Expectations regarding her postoperative convalescence have been discussed.  She understands and accepts all potential risks of surgery including but not limited to risk of death, stroke or other neurologic complication, myocardial infarction, congestive heart failure, respiratory failure, renal failure,  heart block or bradycardia requiring permanent pacemaker placement, bleeding requiring transfusion and/or reexploration, arrhythmia, infection or other wound complications, pneumonia, pleural and/or pericardial effusion, pulmonary embolus, aortic dissection or other major vascular complication, or delayed complications related to valve repair or replacement including but not limited to structural valve deterioration and failure, thrombosis, embolization, endocarditis, or paravalvular leak.  All of her questions have been answered.    I spent in excess of 15 minutes during the conduct of this office consultation and >50% of this time involved direct face-to-face encounter with the patient for counseling and/or coordination of their care.    Salvatore Decentlarence H. Cornelius Moraswen, MD 04/10/2018 2:07 PM

## 2018-04-15 ENCOUNTER — Ambulatory Visit (HOSPITAL_COMMUNITY)
Admission: RE | Admit: 2018-04-15 | Discharge: 2018-04-15 | Disposition: A | Discharge status: Home / Self Care | Payer: Self-pay | Source: Ambulatory Visit | Attending: Thoracic Surgery (Cardiothoracic Vascular Surgery) | Admitting: Thoracic Surgery (Cardiothoracic Vascular Surgery)

## 2018-04-15 ENCOUNTER — Ambulatory Visit: Payer: Self-pay | Admitting: Thoracic Surgery (Cardiothoracic Vascular Surgery)

## 2018-04-15 ENCOUNTER — Encounter (HOSPITAL_COMMUNITY): Payer: Self-pay

## 2018-04-15 ENCOUNTER — Encounter (HOSPITAL_COMMUNITY)
Admission: RE | Admit: 2018-04-15 | Discharge: 2018-04-15 | Disposition: A | Discharge status: Home / Self Care | Payer: Self-pay | Source: Ambulatory Visit | Attending: Thoracic Surgery (Cardiothoracic Vascular Surgery) | Admitting: Thoracic Surgery (Cardiothoracic Vascular Surgery)

## 2018-04-15 ENCOUNTER — Other Ambulatory Visit: Payer: Self-pay

## 2018-04-15 DIAGNOSIS — I071 Rheumatic tricuspid insufficiency: Secondary | ICD-10-CM

## 2018-04-15 LAB — COMPREHENSIVE METABOLIC PANEL
ALK PHOS: 58 U/L (ref 38–126)
ALT: 16 U/L (ref 0–44)
AST: 21 U/L (ref 15–41)
Albumin: 3.9 g/dL (ref 3.5–5.0)
Anion gap: 13 (ref 5–15)
BUN: 15 mg/dL (ref 6–20)
CO2: 21 mmol/L — ABNORMAL LOW (ref 22–32)
Calcium: 9.1 mg/dL (ref 8.9–10.3)
Chloride: 105 mmol/L (ref 98–111)
Creatinine, Ser: 0.89 mg/dL (ref 0.44–1.00)
GFR calc Af Amer: >60 mL/min (ref >60)
GFR calc non Af Amer: >60 mL/min (ref >60)
Glucose, Bld: 94 mg/dL (ref 70–99)
Potassium: 3.5 mmol/L (ref 3.5–5.1)
SODIUM: 139 mmol/L (ref 135–145)
Total Bilirubin: 0.3 mg/dL (ref 0.3–1.2)
Total Protein: 6.7 g/dL (ref 6.5–8.1)

## 2018-04-15 LAB — PULMONARY FUNCTION TEST
DL/VA % pred: 80 %
DL/VA: 4.2 ml/min/mmHg/L
DLCO unc % pred: 70 %
DLCO unc: 20.89 ml/min/mmHg
FEF 25-75 Post: 2.47 L/sec
FEF 25-75 Pre: 3.69 L/sec
FEF2575-%Change-Post: -33 %
FEF2575-%PRED-POST: 69 %
FEF2575-%Pred-Pre: 103 %
FEV1-%Change-Post: -10 %
FEV1-%Pred-Post: 91 %
FEV1-%Pred-Pre: 101 %
FEV1-Post: 3.2 L
FEV1-Pre: 3.57 L
FEV1FVC-%CHANGE-POST: 2 %
FEV1FVC-%Pred-Pre: 98 %
FEV6-%Change-Post: -12 %
FEV6-%Pred-Post: 90 %
FEV6-%Pred-Pre: 103 %
FEV6-Post: 3.8 L
FEV6-Pre: 4.34 L
FEV6FVC-%Pred-Post: 101 %
FEV6FVC-%Pred-Pre: 101 %
FVC-%Change-Post: -12 %
FVC-%Pred-Post: 89 %
FVC-%Pred-Pre: 102 %
FVC-Post: 3.8 L
FVC-Pre: 4.34 L
POST FEV1/FVC RATIO: 84 %
Post FEV6/FVC ratio: 100 %
Pre FEV1/FVC ratio: 82 %
Pre FEV6/FVC Ratio: 100 %
RV % pred: 137 %
RV: 2.28 L
TLC % pred: 113 %
TLC: 6.42 L

## 2018-04-15 LAB — CBC
HCT: 38.1 % (ref 36.0–46.0)
Hemoglobin: 12.3 g/dL (ref 12.0–15.0)
MCH: 28.7 pg (ref 26.0–34.0)
MCHC: 32.3 g/dL (ref 30.0–36.0)
MCV: 88.8 fL (ref 80.0–100.0)
NRBC: 0 % (ref 0.0–0.2)
Platelets: 249 10*3/uL (ref 150–400)
RBC: 4.29 MIL/uL (ref 3.87–5.11)
RDW: 12.2 % (ref 11.5–15.5)
WBC: 7.3 10*3/uL (ref 4.0–10.5)

## 2018-04-15 LAB — URINALYSIS, ROUTINE W REFLEX MICROSCOPIC
BILIRUBIN URINE: NEGATIVE
Glucose, UA: NEGATIVE mg/dL
Hgb urine dipstick: NEGATIVE
Ketones, ur: NEGATIVE mg/dL
Leukocytes, UA: NEGATIVE
NITRITE: NEGATIVE
PH: 5 (ref 5.0–8.0)
Protein, ur: NEGATIVE mg/dL
Specific Gravity, Urine: 1.023 (ref 1.005–1.030)

## 2018-04-15 LAB — PROTIME-INR
INR: 0.98
Prothrombin Time: 12.9 seconds (ref 11.4–15.2)

## 2018-04-15 LAB — SURGICAL PCR SCREEN
MRSA, PCR: NEGATIVE
Staphylococcus aureus: NEGATIVE

## 2018-04-15 LAB — TYPE AND SCREEN
ABO/RH(D): A POS
ANTIBODY SCREEN: NEGATIVE

## 2018-04-15 LAB — ABO/RH: ABO/RH(D): A POS

## 2018-04-15 LAB — BLOOD GAS, ARTERIAL
Acid-base deficit: 0 mmol/L (ref 0.0–2.0)
Bicarbonate: 24.1 mmol/L (ref 20.0–28.0)
DRAWN BY: 421801
FIO2: 21
O2 Saturation: 96.4 %
Patient temperature: 98.6
pCO2 arterial: 39.6 mmHg (ref 32.0–48.0)
pH, Arterial: 7.402 (ref 7.350–7.450)
pO2, Arterial: 85.9 mmHg (ref 83.0–108.0)

## 2018-04-15 LAB — HEMOGLOBIN A1C
Hgb A1c MFr Bld: 5.8 % — ABNORMAL HIGH (ref 4.8–5.6)
Mean Plasma Glucose: 119.76 mg/dL

## 2018-04-15 LAB — APTT: aPTT: 27 seconds (ref 24–36)

## 2018-04-15 MED ORDER — ALBUTEROL SULFATE (2.5 MG/3ML) 0.083% IN NEBU
2.5000 mg | INHALATION_SOLUTION | Freq: Once | RESPIRATORY_TRACT | Status: AC
  Administered 2018-04-15: 2.5 mg via RESPIRATORY_TRACT

## 2018-04-15 NOTE — Progress Notes (Signed)
Spoke with Heather Day regarding patients methadone. Patient questioning if she needs to go to methadone clinic DOS or if she will be able to get her AM dose of Methadone here. Per Heather Day patient to go to clinic DOS for her normal morning dose. Patient states she has filled out and submitted the necessary paperwork to be dosed here while in hospital.

## 2018-04-15 NOTE — Progress Notes (Signed)
Pre TVR evaluation exam completed. Please see preliminary notes on CV PROC under chart review. Mayo Faulk H Madysin Crisp(RDMS RVT) 04/15/18 2:45 PM

## 2018-04-15 NOTE — Pre-Procedure Instructions (Addendum)
Symphanie Smallridge  04/15/2018      [facility] - [place], [place] - [address] [place] [address] Phone: [phone] Fax: [phone]  [facility] - [place], [place] - [address] [place] [address] Phone: [phone] Fax: [phone]    Your procedure is scheduled on Thursday February 6th.  Report to [facility] Admitting at 5:30 A.M.  Call this number if you have problems the morning of surgery:  [phone]   Remember:  Do not eat or drink after midnight.      Take these medicines the morning of surgery with A SIP OF WATER  metoprolol tartrate (LOPRESSOR) omeprazole (PRILOSEC) Methadone   7 days prior to surgery STOP taking any Aspirin(unless otherwise instructed by your surgeon), Aleve, Naproxen, Ibuprofen, Motrin, Advil, Goody's, BC's, all herbal medications, fish oil, and all vitamins     Do not wear jewelry, make-up or nail polish.  Do not wear lotions, powders, or perfumes, or deodorant.  Do not shave 48 hours prior to surgery.    Do not bring valuables to the hospital.  [facility] is not responsible for any belongings or valuables.  Contacts, dentures or bridgework may not be worn into surgery.  Leave your suitcase in the car.  After surgery it may be brought to your room.  For patients admitted to the hospital, discharge time will be determined by your treatment team.  Patients discharged the day of surgery will not be allowed to drive home.   [facility]- Preparing For Surgery  Before surgery, you can play an important role. Because skin is not sterile, your skin needs to be as free of germs as possible. You can reduce the number of germs on your skin by washing with CHG (chlorahexidine gluconate) Soap before surgery.  CHG is an antiseptic cleaner which kills germs and bonds with the skin to continue killing germs even after washing.     Oral Hygiene is also important to reduce your risk of infection.  Remember - BRUSH YOUR TEETH THE MORNING OF SURGERY WITH YOUR REGULAR TOOTHPASTE  Please do not use if you have an allergy to CHG or antibacterial soaps. If your skin becomes reddened/irritated stop using the CHG.  Do not shave (including legs and underarms) for at least 48 hours prior to first CHG shower. It is OK to shave your face.  Please follow these instructions carefully.   1. Shower the NIGHT BEFORE SURGERY and the MORNING OF SURGERY with CHG.   2. If you chose to wash your hair, wash your hair first as usual with your normal shampoo.  3. After you shampoo, rinse your hair and body thoroughly to remove the shampoo.  4. Use CHG as you would any other liquid soap. You can apply CHG directly to the skin and wash gently with a scrungie or a clean washcloth.   5. Apply the CHG Soap to your body ONLY FROM THE NECK DOWN.  Do not use on open wounds or open sores. Avoid contact with your eyes, ears, mouth and genitals (private parts). Wash Face and genitals (private parts)  with your normal soap.  6. Wash thoroughly, paying special attention to the area where your surgery will be performed.  7. Thoroughly rinse your body with warm water from the neck down.  8. DO NOT shower/wash with your normal soap after using and rinsing off the  CHG Soap.  9. Pat yourself dry with a CLEAN TOWEL.  10. Wear CLEAN PAJAMAS to bed the night before surgery, wear comfortable clothes the morning of surgery  11. Place CLEAN SHEETS on your bed the night of your first shower and DO NOT SLEEP WITH PETS.    Day of Surgery: Shower as stated above. Do not apply any deodorants/lotions.  Please wear clean clothes to the hospital/surgery center.   Remember to brush your teeth WITH YOUR REGULAR TOOTHPASTE.  Please read over the following fact sheets that you were given.

## 2018-04-15 NOTE — Progress Notes (Signed)
PCP - denies Cardiologist -Dr.  Bing Matter  Chest x-ray - 04/15/18 EKG - 04/15/18 Stress Test- denies ECHO - 04/01/18 Cardiac Cath - denies  Sleep Study - denies  Aspirin Instructions: Patient instructed to hold all Aspirin, NSAID's, herbal medications, fish oil and vitamins 7 days prior to surgery.   Anesthesia review:   Patient denies shortness of breath, fever, cough and chest pain at PAT appointment   Patient verbalized understanding of instructions that were given to them at the PAT appointment. Patient was also instructed that they will need to review over the PAT instructions again at home before surgery.

## 2018-04-16 NOTE — Progress Notes (Signed)
Anesthesia Chart Review:  Case:  [id number] Date/Time:  04/18/18 0715   Procedures:      TRICUSPID VALVE REPAIR OR REPLACEMENT (N/A Chest)     TRANSESOPHAGEAL ECHOCARDIOGRAM (TEE) (N/A )   Anesthesia type:  General   Pre-op diagnosis:  TR   [facility] / [facility]   Surgeon:  Purcell Nails, MD      DISCUSSION: Patient is a 33 year old female scheduled for the above procedure.  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 04/01/18 coronary CT showed no evidence of CAD. She is s/p appendectomy 10/03/17, teeth extractions 11/29/17, and cholecystectomy 01/15/18. She moved to [facility] from [place] in 2018. BMI is consistent with morbid obesity.  Anesthesiologist to evaluate on the day of surgery.   Per Dr. Cornelius Moras: No Swan-Ganz ALL NECK LINES ON LEFT. NO NECK LINES ON RIGHT.  VS: BP 109/79   Pulse 86   Temp 37 C (Oral)   Resp 20   Ht 5\' 8"  (1.727 m)   Wt (!) 144 kg   LMP 04/04/2018 (Approximate)   SpO2 95%   BMI 48.27 kg/m    PROVIDERS: Patient, No Pcp Per - [name], MD is cardiologist. Last visit 02/28/18.  LABS: Labs reviewed: Acceptable for surgery.  (all labs ordered are listed, but only abnormal results are displayed)  Labs Reviewed  COMPREHENSIVE METABOLIC PANEL - Abnormal; Notable for the following components:      Result Value   CO2 21 (*)    All other components within normal limits  HEMOGLOBIN A1C - Abnormal; Notable for the following components:   Hgb A1c MFr Bld 5.8 (*)    All other components within normal limits  URINALYSIS, ROUTINE W REFLEX MICROSCOPIC - Abnormal; Notable for the following components:   APPearance HAZY (*)    All other components within normal limits  SURGICAL PCR SCREEN  APTT  BLOOD GAS, ARTERIAL  CBC  PROTIME-INR  TYPE AND SCREEN  ABO/RH    PFTs 04/15/18: FVC 4.34 (102%), FEV1 3.57 (101%), DLCO unc 20.89  (70%).   IMAGES: CXR 04/15/18: IMPRESSION: Decreased inspiration with minimal lingular atelectasis.  EKG: 04/15/18: NSR, non-specific T wave abnormality. Prolonged QT (QT 410 ms, QTc 479 ms).   CV: Carotid U/S 04/15/18: Summary: Right Carotid: Velocities in the right ICA are consistent with a 1-39% stenosis. Left Carotid: Velocities in the left ICA are consistent with a 1-39% stenosis. Vertebrals: Bilateral vertebral arteries demonstrate antegrade flow.  CT coronary 04/01/18: IMPRESSION: 1. Coronary calcium score of 0. This was 0 percentile for age and sex matched control. 2. Normal coronary origin with right dominance. 3. No evidence of CAD. 4. Mildly dilated pulmonary artery measuring 31 mm suggestive of pulmonary hypertension. 5.  Severely dilated right atrium.  Echo (TTE) 04/01/18: Study Conclusions - Left ventricle: The cavity size was normal. Wall thickness was   normal. Systolic function was normal. The estimated ejection   fraction was in the range of 50% to 55%. Wall motion was normal;   there were no regional wall motion abnormalities. Left   ventricular diastolic function parameters were normal. - Right ventricle: The cavity size was mildly dilated. - Right atrium: The atrium was moderately dilated. - Tricuspid valve: There was severe regurgitation. Impressions: - Normal LV function; severe TR; moderate RAE; mild RVE.  TEE 10/03/17: Study Conclusions - Left ventricle: Systolic function was normal. The estimated  ejection fraction was in the range of 55% to 60%. Wall motion was normal; there were no regional wall motion abnormalities. - Aortic valve: There was no regurgitation. - Mitral valve: There was trivial regurgitation. - Left atrium: No evidence of thrombus in the atrial cavity or appendage. - Right atrium: No evidence of thrombus in the atrial cavity or appendage. - Atrial septum: No defect or patent foramen ovale was identified. - Tricuspid  valve: Tricuspid valve in 3D showed apparent partial destruction of posterior leaflet leading to malcoaptation (See image 80) There was severe regurgitation directed eccentrically and toward the septum. Peak RV-RA gradient (S): 30 mm Hg. - Pulmonic valve: There was trivial regurgitation. Impressions: - Severe eccentric TR, initially appeared as malcoaptation but on 3D imaging, the posterior leaflet appears to be partially destroyed. No evidence of current vegetation. No other significant valvular abnormalities.  48 hour Holter monitor 09/24/17: Baseline rhythm: Normal sinus rhythm Minimum heart rate: 56 BPM. Average heart rate: 85BPM. Maximal heart rate 149 BPM. Atrial arrhythmia: 2 premature supraventricular beats Ventricular arrhythmia: 1 premature ventricular beat Conduction abnormality: None Symptoms: None Conclusion: Normal Holter monitor.   Past Medical History:  Diagnosis Date  . Anxiety    panic attacks  . Bipolar disorder (HCC)   . Drug abuse, IV (HCC)   . Dyspnea    when walking up stairs  . Gallstone   . GERD (gastroesophageal reflux disease)   . Heartburn   . Hepatitis C   . Periodontitis chronic, apical    dental caries  . PTSD (post-traumatic stress disorder)   . Severe tricuspid regurgitation   . Wears glasses     Past Surgical History:  Procedure Laterality Date  . APPENDECTOMY    . CESAREAN SECTION    . CHOLECYSTECTOMY N/A 01/15/2018   Procedure: LAPAROSCOPIC CHOLECYSTECTOMY ERAS PATHWAY;  Surgeon: Glenna Fellows, MD;  Location: [facility];  Service: General;  Laterality: N/A;  . INTRAOPERATIVE CHOLANGIOGRAM N/A 01/15/2018   Procedure: INTRAOPERATIVE CHOLANGIOGRAM;  Surgeon: Glenna Fellows, MD;  Location: [facility];  Service: General;  Laterality: N/A;  . LAPAROSCOPIC APPENDECTOMY N/A 07/04/2017   Procedure: APPENDECTOMY LAPAROSCOPIC;  Surgeon: Luretha Murphy, MD;  Location: [facility] ORS;  Service: General;  Laterality: N/A;  . MULTIPLE  EXTRACTIONS WITH ALVEOLOPLASTY Bilateral 11/29/2017   Procedure: Extraction of tooth #'s 2,5-15, and 31 with alveoloplasty and gross debridement of remaining dentition;  Surgeon: Charlynne Pander, DDS;  Location: [facility];  Service: Oral Surgery;  Laterality: Bilateral;  . TEE WITHOUT CARDIOVERSION N/A 10/03/2017   Procedure: TRANSESOPHAGEAL ECHOCARDIOGRAM (TEE);  Surgeon: Jodelle Red, MD;  Location: [facility] ENDOSCOPY;  Service: Cardiovascular;  Laterality: N/A;    MEDICATIONS: . furosemide (LASIX) 80 MG tablet  . ibuprofen (ADVIL,MOTRIN) 200 MG tablet  . methadone (DOLOPHINE) 10 MG/5ML solution  . metoprolol tartrate (LOPRESSOR) 100 MG tablet  . omeprazole (PRILOSEC) 20 MG capsule  . potassium chloride (K-DUR,KLOR-CON) 10 MEQ tablet   No current facility-administered medications for this encounter.     Shonna Chock, PA-C Surgical Short Stay/Anesthesiology [facility] Phone [phone] [facility] Phone [phone] 04/16/2018 11:11 AM

## 2018-04-17 ENCOUNTER — Encounter (HOSPITAL_COMMUNITY): Payer: Self-pay | Admitting: Certified Registered Nurse Anesthetist

## 2018-04-17 MED ORDER — TRANEXAMIC ACID (OHS) PUMP PRIME SOLUTION
2.0000 mg/kg | INTRAVENOUS | Status: DC
  Filled 2018-04-17: qty 2.88

## 2018-04-17 MED ORDER — POTASSIUM CHLORIDE 2 MEQ/ML IV SOLN
80.0000 meq | INTRAVENOUS | Status: DC
  Filled 2018-04-17: qty 40

## 2018-04-17 MED ORDER — INSULIN REGULAR(HUMAN) IN NACL 100-0.9 UT/100ML-% IV SOLN
INTRAVENOUS | Status: AC
  Administered 2018-04-18: 1 [IU]/h via INTRAVENOUS
  Filled 2018-04-17: qty 100

## 2018-04-17 MED ORDER — KENNESTONE BLOOD CARDIOPLEGIA VIAL
13.0000 mL | Freq: Once | Status: DC
  Filled 2018-04-17: qty 1

## 2018-04-17 MED ORDER — VANCOMYCIN HCL 10 G IV SOLR
1500.0000 mg | INTRAVENOUS | Status: AC
  Administered 2018-04-18: 1500 mg via INTRAVENOUS
  Filled 2018-04-17: qty 1500

## 2018-04-17 MED ORDER — PLASMA-LYTE 148 IV SOLN
INTRAVENOUS | Status: DC
  Filled 2018-04-17: qty 2.5

## 2018-04-17 MED ORDER — SODIUM CHLORIDE 0.9 % IV SOLN
1.5000 g | INTRAVENOUS | Status: AC
  Administered 2018-04-18: .75 g via INTRAVENOUS
  Administered 2018-04-18: 1.5 g via INTRAVENOUS
  Filled 2018-04-17: qty 1.5

## 2018-04-17 MED ORDER — DOPAMINE-DEXTROSE 3.2-5 MG/ML-% IV SOLN
0.0000 ug/kg/min | INTRAVENOUS | Status: DC
  Filled 2018-04-17: qty 250

## 2018-04-17 MED ORDER — EPINEPHRINE PF 1 MG/ML IJ SOLN
0.0000 ug/min | INTRAVENOUS | Status: DC
  Filled 2018-04-17: qty 4

## 2018-04-17 MED ORDER — DEXMEDETOMIDINE HCL IN NACL 400 MCG/100ML IV SOLN
0.1000 ug/kg/h | INTRAVENOUS | Status: AC
  Administered 2018-04-18: .3 ug/kg/h via INTRAVENOUS
  Filled 2018-04-17: qty 100

## 2018-04-17 MED ORDER — MILRINONE LACTATE IN DEXTROSE 20-5 MG/100ML-% IV SOLN
0.3000 ug/kg/min | INTRAVENOUS | Status: DC
  Filled 2018-04-17: qty 100

## 2018-04-17 MED ORDER — KENNESTONE BLOOD CARDIOPLEGIA (KBC) MANNITOL SYRINGE (20%, 32ML)
32.0000 mL | Freq: Once | INTRAVENOUS | Status: DC
  Filled 2018-04-17: qty 1

## 2018-04-17 MED ORDER — TRANEXAMIC ACID (OHS) BOLUS VIA INFUSION
15.0000 mg/kg | INTRAVENOUS | Status: AC
  Administered 2018-04-18: 2160 mg via INTRAVENOUS
  Filled 2018-04-17: qty 2160

## 2018-04-17 MED ORDER — MAGNESIUM SULFATE 50 % IJ SOLN
40.0000 meq | INTRAMUSCULAR | Status: DC
  Filled 2018-04-17: qty 9.85

## 2018-04-17 MED ORDER — VANCOMYCIN HCL 1000 MG IV SOLR
INTRAVENOUS | Status: DC
  Filled 2018-04-17: qty 1000

## 2018-04-17 MED ORDER — TRANEXAMIC ACID 1000 MG/10ML IV SOLN
1.5000 mg/kg/h | INTRAVENOUS | Status: AC
  Administered 2018-04-18: 1.5 mg/kg/h via INTRAVENOUS
  Administered 2018-04-18: 11:00:00 via INTRAVENOUS
  Filled 2018-04-17: qty 25

## 2018-04-17 MED ORDER — SODIUM CHLORIDE 0.9 % IV SOLN
750.0000 mg | INTRAVENOUS | Status: DC
  Filled 2018-04-17: qty 750

## 2018-04-17 MED ORDER — PHENYLEPHRINE HCL-NACL 20-0.9 MG/250ML-% IV SOLN
30.0000 ug/min | INTRAVENOUS | Status: AC
  Administered 2018-04-18: 15 ug/min via INTRAVENOUS
  Filled 2018-04-17: qty 250

## 2018-04-17 MED ORDER — NITROGLYCERIN IN D5W 200-5 MCG/ML-% IV SOLN
2.0000 ug/min | INTRAVENOUS | Status: DC
  Filled 2018-04-17: qty 250

## 2018-04-17 MED ORDER — NOREPINEPHRINE BITARTRATE 1 MG/ML IV SOLN
0.0000 ug/min | INTRAVENOUS | Status: DC
  Filled 2018-04-17: qty 4

## 2018-04-17 MED ORDER — SODIUM CHLORIDE 0.9 % IV SOLN
INTRAVENOUS | Status: DC
  Filled 2018-04-17: qty 30

## 2018-04-18 ENCOUNTER — Inpatient Hospital Stay (HOSPITAL_COMMUNITY): Payer: Self-pay | Admitting: Anesthesiology

## 2018-04-18 ENCOUNTER — Inpatient Hospital Stay (HOSPITAL_COMMUNITY): Payer: Self-pay

## 2018-04-18 ENCOUNTER — Inpatient Hospital Stay (HOSPITAL_COMMUNITY)
Admission: RE | Disposition: A | Discharge status: Home Health | Payer: Self-pay | Source: Home / Self Care | Attending: Thoracic Surgery (Cardiothoracic Vascular Surgery)

## 2018-04-18 ENCOUNTER — Encounter (HOSPITAL_COMMUNITY): Payer: Self-pay

## 2018-04-18 ENCOUNTER — Other Ambulatory Visit: Payer: Self-pay

## 2018-04-18 ENCOUNTER — Inpatient Hospital Stay (HOSPITAL_COMMUNITY)
Admission: RE | Admit: 2018-04-18 | Discharge: 2018-04-23 | DRG: 220 | Disposition: A | Discharge status: Home Health | Payer: Self-pay | Attending: Thoracic Surgery (Cardiothoracic Vascular Surgery) | Admitting: Thoracic Surgery (Cardiothoracic Vascular Surgery)

## 2018-04-18 DIAGNOSIS — J9811 Atelectasis: Secondary | ICD-10-CM

## 2018-04-18 DIAGNOSIS — Z8679 Personal history of other diseases of the circulatory system: Secondary | ICD-10-CM

## 2018-04-18 DIAGNOSIS — Z79891 Long term (current) use of opiate analgesic: Secondary | ICD-10-CM

## 2018-04-18 DIAGNOSIS — K219 Gastro-esophageal reflux disease without esophagitis: Secondary | ICD-10-CM | POA: Diagnosis present

## 2018-04-18 DIAGNOSIS — Z6841 Body Mass Index (BMI) 40.0 and over, adult: Secondary | ICD-10-CM

## 2018-04-18 DIAGNOSIS — I38 Endocarditis, valve unspecified: Secondary | ICD-10-CM | POA: Diagnosis present

## 2018-04-18 DIAGNOSIS — F319 Bipolar disorder, unspecified: Secondary | ICD-10-CM | POA: Diagnosis present

## 2018-04-18 DIAGNOSIS — I361 Nonrheumatic tricuspid (valve) insufficiency: Secondary | ICD-10-CM

## 2018-04-18 DIAGNOSIS — Z79899 Other long term (current) drug therapy: Secondary | ICD-10-CM

## 2018-04-18 DIAGNOSIS — I071 Rheumatic tricuspid insufficiency: Principal | ICD-10-CM | POA: Diagnosis present

## 2018-04-18 DIAGNOSIS — Z9889 Other specified postprocedural states: Secondary | ICD-10-CM

## 2018-04-18 DIAGNOSIS — F419 Anxiety disorder, unspecified: Secondary | ICD-10-CM | POA: Diagnosis present

## 2018-04-18 DIAGNOSIS — J90 Pleural effusion, not elsewhere classified: Secondary | ICD-10-CM

## 2018-04-18 DIAGNOSIS — I509 Heart failure, unspecified: Secondary | ICD-10-CM | POA: Diagnosis present

## 2018-04-18 DIAGNOSIS — Z87891 Personal history of nicotine dependence: Secondary | ICD-10-CM

## 2018-04-18 DIAGNOSIS — F4310 Post-traumatic stress disorder, unspecified: Secondary | ICD-10-CM | POA: Diagnosis present

## 2018-04-18 DIAGNOSIS — D62 Acute posthemorrhagic anemia: Secondary | ICD-10-CM | POA: Diagnosis not present

## 2018-04-18 DIAGNOSIS — F410 Panic disorder [episodic paroxysmal anxiety] without agoraphobia: Secondary | ICD-10-CM | POA: Diagnosis present

## 2018-04-18 HISTORY — DX: Other specified postprocedural states: Z98.890

## 2018-04-18 HISTORY — PX: TRICUSPID VALVE REPLACEMENT: SHX816

## 2018-04-18 HISTORY — PX: TEE WITHOUT CARDIOVERSION: SHX5443

## 2018-04-18 LAB — CBC
HCT: 33.1 % — ABNORMAL LOW (ref 36.0–46.0)
HCT: 35.5 % — ABNORMAL LOW (ref 36.0–46.0)
Hemoglobin: 10.8 g/dL — ABNORMAL LOW (ref 12.0–15.0)
Hemoglobin: 11.3 g/dL — ABNORMAL LOW (ref 12.0–15.0)
MCH: 29.1 pg (ref 26.0–34.0)
MCH: 28.4 pg (ref 26.0–34.0)
MCHC: 32.6 g/dL (ref 30.0–36.0)
MCHC: 31.8 g/dL (ref 30.0–36.0)
MCV: 89.2 fL (ref 80.0–100.0)
MCV: 89.2 fL (ref 80.0–100.0)
NRBC: 0 % (ref 0.0–0.2)
PLATELETS: 193 10*3/uL (ref 150–400)
Platelets: 176 10*3/uL (ref 150–400)
RBC: 3.71 MIL/uL — ABNORMAL LOW (ref 3.87–5.11)
RBC: 3.98 MIL/uL (ref 3.87–5.11)
RDW: 12.2 % (ref 11.5–15.5)
RDW: 12.2 % (ref 11.5–15.5)
WBC: 13.6 10*3/uL — ABNORMAL HIGH (ref 4.0–10.5)
WBC: 9.8 10*3/uL (ref 4.0–10.5)
nRBC: 0 % (ref 0.0–0.2)

## 2018-04-18 LAB — POCT I-STAT 7, (LYTES, BLD GAS, ICA,H+H)
ACID-BASE DEFICIT: 4 mmol/L — AB (ref 0.0–2.0)
Acid-Base Excess: 1 mmol/L (ref 0.0–2.0)
Acid-base deficit: 4 mmol/L — ABNORMAL HIGH (ref 0.0–2.0)
Bicarbonate: 26.6 mmol/L (ref 20.0–28.0)
Bicarbonate: 21.8 mmol/L (ref 20.0–28.0)
Bicarbonate: 21.7 mmol/L (ref 20.0–28.0)
Calcium, Ion: 1.05 mmol/L — ABNORMAL LOW (ref 1.15–1.40)
Calcium, Ion: 1.11 mmol/L — ABNORMAL LOW (ref 1.15–1.40)
Calcium, Ion: 1.13 mmol/L — ABNORMAL LOW (ref 1.15–1.40)
HCT: 31 % — ABNORMAL LOW (ref 36.0–46.0)
HCT: 30 % — ABNORMAL LOW (ref 36.0–46.0)
HEMATOCRIT: 26 % — AB (ref 36.0–46.0)
Hemoglobin: 8.8 g/dL — ABNORMAL LOW (ref 12.0–15.0)
Hemoglobin: 10.5 g/dL — ABNORMAL LOW (ref 12.0–15.0)
Hemoglobin: 10.2 g/dL — ABNORMAL LOW (ref 12.0–15.0)
O2 Saturation: 100 %
O2 Saturation: 94 %
O2 Saturation: 93 %
PH ART: 7.302 — AB (ref 7.350–7.450)
PH ART: 7.322 — AB (ref 7.350–7.450)
Patient temperature: 37.3
Potassium: 4.7 mmol/L (ref 3.5–5.1)
Potassium: 3.9 mmol/L (ref 3.5–5.1)
Potassium: 4.1 mmol/L (ref 3.5–5.1)
SODIUM: 137 mmol/L (ref 135–145)
Sodium: 141 mmol/L (ref 135–145)
Sodium: 141 mmol/L (ref 135–145)
TCO2: 28 mmol/L (ref 22–32)
TCO2: 23 mmol/L (ref 22–32)
TCO2: 23 mmol/L (ref 22–32)
pCO2 arterial: 45 mmHg (ref 32.0–48.0)
pCO2 arterial: 44.2 mmHg (ref 32.0–48.0)
pCO2 arterial: 42.1 mmHg (ref 32.0–48.0)
pH, Arterial: 7.379 (ref 7.350–7.450)
pO2, Arterial: 368 mmHg — ABNORMAL HIGH (ref 83.0–108.0)
pO2, Arterial: 80 mmHg — ABNORMAL LOW (ref 83.0–108.0)
pO2, Arterial: 75 mmHg — ABNORMAL LOW (ref 83.0–108.0)

## 2018-04-18 LAB — POCT I-STAT 4, (NA,K, GLUC, HGB,HCT)
Glucose, Bld: 97 mg/dL (ref 70–99)
Glucose, Bld: 126 mg/dL — ABNORMAL HIGH (ref 70–99)
Glucose, Bld: 140 mg/dL — ABNORMAL HIGH (ref 70–99)
Glucose, Bld: 139 mg/dL — ABNORMAL HIGH (ref 70–99)
Glucose, Bld: 163 mg/dL — ABNORMAL HIGH (ref 70–99)
Glucose, Bld: 114 mg/dL — ABNORMAL HIGH (ref 70–99)
HCT: 34 % — ABNORMAL LOW (ref 36.0–46.0)
HCT: 31 % — ABNORMAL LOW (ref 36.0–46.0)
HCT: 26 % — ABNORMAL LOW (ref 36.0–46.0)
HCT: 28 % — ABNORMAL LOW (ref 36.0–46.0)
HCT: 31 % — ABNORMAL LOW (ref 36.0–46.0)
HEMATOCRIT: 26 % — AB (ref 36.0–46.0)
HEMOGLOBIN: 9.5 g/dL — AB (ref 12.0–15.0)
HEMOGLOBIN: 10.5 g/dL — AB (ref 12.0–15.0)
Hemoglobin: 11.6 g/dL — ABNORMAL LOW (ref 12.0–15.0)
Hemoglobin: 10.5 g/dL — ABNORMAL LOW (ref 12.0–15.0)
Hemoglobin: 8.8 g/dL — ABNORMAL LOW (ref 12.0–15.0)
Hemoglobin: 8.8 g/dL — ABNORMAL LOW (ref 12.0–15.0)
POTASSIUM: 3.7 mmol/L (ref 3.5–5.1)
Potassium: 3.7 mmol/L (ref 3.5–5.1)
Potassium: 4.7 mmol/L (ref 3.5–5.1)
Potassium: 3.9 mmol/L (ref 3.5–5.1)
Potassium: 4 mmol/L (ref 3.5–5.1)
Potassium: 4.2 mmol/L (ref 3.5–5.1)
Sodium: 140 mmol/L (ref 135–145)
Sodium: 137 mmol/L (ref 135–145)
Sodium: 139 mmol/L (ref 135–145)
Sodium: 139 mmol/L (ref 135–145)
Sodium: 141 mmol/L (ref 135–145)
Sodium: 143 mmol/L (ref 135–145)

## 2018-04-18 LAB — HEMOGLOBIN AND HEMATOCRIT, BLOOD
HCT: 27 % — ABNORMAL LOW (ref 36.0–46.0)
Hemoglobin: 9.2 g/dL — ABNORMAL LOW (ref 12.0–15.0)

## 2018-04-18 LAB — BASIC METABOLIC PANEL
ANION GAP: 6 (ref 5–15)
BUN: 9 mg/dL (ref 6–20)
CO2: 23 mmol/L (ref 22–32)
Calcium: 7.8 mg/dL — ABNORMAL LOW (ref 8.9–10.3)
Chloride: 110 mmol/L (ref 98–111)
Creatinine, Ser: 0.77 mg/dL (ref 0.44–1.00)
GFR calc Af Amer: >60 mL/min (ref >60)
GFR calc non Af Amer: >60 mL/min (ref >60)
Glucose, Bld: 139 mg/dL — ABNORMAL HIGH (ref 70–99)
Potassium: 4.1 mmol/L (ref 3.5–5.1)
Sodium: 139 mmol/L (ref 135–145)

## 2018-04-18 LAB — GLUCOSE, CAPILLARY
GLUCOSE-CAPILLARY: 127 mg/dL — AB (ref 70–99)
GLUCOSE-CAPILLARY: 121 mg/dL — AB (ref 70–99)
Glucose-Capillary: 119 mg/dL — ABNORMAL HIGH (ref 70–99)
Glucose-Capillary: 127 mg/dL — ABNORMAL HIGH (ref 70–99)
Glucose-Capillary: 133 mg/dL — ABNORMAL HIGH (ref 70–99)
Glucose-Capillary: 134 mg/dL — ABNORMAL HIGH (ref 70–99)
Glucose-Capillary: 135 mg/dL — ABNORMAL HIGH (ref 70–99)
Glucose-Capillary: 136 mg/dL — ABNORMAL HIGH (ref 70–99)
Glucose-Capillary: 180 mg/dL — ABNORMAL HIGH (ref 70–99)

## 2018-04-18 LAB — PLATELET COUNT: Platelets: 279 10*3/uL (ref 150–400)

## 2018-04-18 LAB — MAGNESIUM: MAGNESIUM: 2.6 mg/dL — AB (ref 1.7–2.4)

## 2018-04-18 LAB — PROTIME-INR
INR: 1.35
Prothrombin Time: 16.6 seconds — ABNORMAL HIGH (ref 11.4–15.2)

## 2018-04-18 LAB — APTT: aPTT: 32 seconds (ref 24–36)

## 2018-04-18 LAB — POCT PREGNANCY, URINE: Preg Test, Ur: NEGATIVE

## 2018-04-18 SURGERY — TRICUSPID VALVE REPAIR
Anesthesia: General | Site: Chest

## 2018-04-18 MED ORDER — PROTAMINE SULFATE 10 MG/ML IV SOLN
INTRAVENOUS | Status: AC
  Filled 2018-04-18: qty 25

## 2018-04-18 MED ORDER — MIDAZOLAM HCL (PF) 10 MG/2ML IJ SOLN
INTRAMUSCULAR | Status: AC
  Filled 2018-04-18: qty 2

## 2018-04-18 MED ORDER — SODIUM CHLORIDE 0.9% FLUSH: 3.0000 mL | INTRAVENOUS | Status: DC | PRN

## 2018-04-18 MED ORDER — METHADONE HCL 10 MG PO TABS
85.0000 mg | ORAL_TABLET | Freq: Every day | ORAL | Status: DC
  Administered 2018-04-19 – 2018-04-23 (×5): 85 mg via ORAL
  Filled 2018-04-18 (×5): qty 9

## 2018-04-18 MED ORDER — SODIUM CHLORIDE 0.9 % IV SOLN: INTRAVENOUS | Status: DC

## 2018-04-18 MED ORDER — HEPARIN SODIUM (PORCINE) 1000 UNIT/ML IJ SOLN
INTRAMUSCULAR | Status: AC
  Filled 2018-04-18: qty 1

## 2018-04-18 MED ORDER — ASPIRIN 81 MG PO CHEW: 324.0000 mg | CHEWABLE_TABLET | Freq: Every day | ORAL | Status: DC

## 2018-04-18 MED ORDER — POTASSIUM CHLORIDE 10 MEQ/50ML IV SOLN: 10.0000 meq | INTRAVENOUS | Status: AC

## 2018-04-18 MED ORDER — ACETAMINOPHEN 160 MG/5ML PO SOLN: 650.0000 mg | Freq: Once | ORAL | Status: DC

## 2018-04-18 MED ORDER — SODIUM CHLORIDE (PF) 0.9 % IJ SOLN
OROMUCOSAL | Status: DC | PRN
  Administered 2018-04-18 (×3): 1 mL via TOPICAL

## 2018-04-18 MED ORDER — SODIUM CHLORIDE 0.9% FLUSH
3.0000 mL | Freq: Two times a day (BID) | INTRAVENOUS | Status: DC
  Administered 2018-04-19: 3 mL via INTRAVENOUS

## 2018-04-18 MED ORDER — PHENYLEPHRINE 40 MCG/ML (10ML) SYRINGE FOR IV PUSH (FOR BLOOD PRESSURE SUPPORT)
PREFILLED_SYRINGE | INTRAVENOUS | Status: AC
  Filled 2018-04-18: qty 10

## 2018-04-18 MED ORDER — SODIUM CHLORIDE 0.9 % IV SOLN: 250.0000 mL | INTRAVENOUS | Status: DC

## 2018-04-18 MED ORDER — INSULIN REGULAR(HUMAN) IN NACL 100-0.9 UT/100ML-% IV SOLN
INTRAVENOUS | Status: DC
  Administered 2018-04-19: 5.4 [IU]/h via INTRAVENOUS
  Filled 2018-04-18: qty 100

## 2018-04-18 MED ORDER — OXYCODONE HCL 5 MG PO TABS
5.0000 mg | ORAL_TABLET | ORAL | Status: DC | PRN
  Administered 2018-04-19 – 2018-04-23 (×14): 10 mg via ORAL
  Filled 2018-04-18 (×14): qty 2

## 2018-04-18 MED ORDER — ROCURONIUM BROMIDE 10 MG/ML (PF) SYRINGE
PREFILLED_SYRINGE | INTRAVENOUS | Status: DC | PRN
  Administered 2018-04-18: 50 mg via INTRAVENOUS
  Administered 2018-04-18: 20 mg via INTRAVENOUS
  Administered 2018-04-18 (×3): 50 mg via INTRAVENOUS
  Administered 2018-04-18: 20 mg via INTRAVENOUS

## 2018-04-18 MED ORDER — ROCURONIUM BROMIDE 50 MG/5ML IV SOSY
PREFILLED_SYRINGE | INTRAVENOUS | Status: AC
  Filled 2018-04-18: qty 25

## 2018-04-18 MED ORDER — GLUTARALDEHYDE 0.625% SOAKING SOLUTION
TOPICAL | Status: DC | PRN
  Administered 2018-04-18 (×2): 1 via TOPICAL

## 2018-04-18 MED ORDER — SUGAMMADEX SODIUM 200 MG/2ML IV SOLN
INTRAVENOUS | Status: DC | PRN
  Administered 2018-04-18: 300 mg via INTRAVENOUS

## 2018-04-18 MED ORDER — GLUTARALDEHYDE 0.625% SOAKING SOLUTION
TOPICAL | Status: DC
  Filled 2018-04-18: qty 50

## 2018-04-18 MED ORDER — SODIUM CHLORIDE 0.9 % IR SOLN
Status: DC | PRN
  Administered 2018-04-18: 6000 mL

## 2018-04-18 MED ORDER — BISACODYL 5 MG PO TBEC
10.0000 mg | DELAYED_RELEASE_TABLET | Freq: Every day | ORAL | Status: DC
  Administered 2018-04-19 – 2018-04-23 (×5): 10 mg via ORAL
  Filled 2018-04-18 (×5): qty 2

## 2018-04-18 MED ORDER — MIDAZOLAM HCL 2 MG/2ML IJ SOLN
INTRAMUSCULAR | Status: AC
  Filled 2018-04-18: qty 2

## 2018-04-18 MED ORDER — PHENYLEPHRINE HCL-NACL 20-0.9 MG/250ML-% IV SOLN: 0.0000 ug/min | INTRAVENOUS | Status: DC

## 2018-04-18 MED ORDER — LACTATED RINGERS IV SOLN: 500.0000 mL | Freq: Once | INTRAVENOUS | Status: DC | PRN

## 2018-04-18 MED ORDER — LACTATED RINGERS IV SOLN
INTRAVENOUS | Status: DC | PRN
  Administered 2018-04-18 (×2): via INTRAVENOUS

## 2018-04-18 MED ORDER — EPHEDRINE SULFATE 50 MG/ML IJ SOLN
INTRAMUSCULAR | Status: DC | PRN
  Administered 2018-04-18: 5 mg via INTRAVENOUS
  Administered 2018-04-18: 10 mg via INTRAVENOUS

## 2018-04-18 MED ORDER — TRANEXAMIC ACID-NACL 1000-0.7 MG/100ML-% IV SOLN: 1000.0000 mg | INTRAVENOUS | Status: DC

## 2018-04-18 MED ORDER — BISACODYL 10 MG RE SUPP: 10.0000 mg | Freq: Every day | RECTAL | Status: DC

## 2018-04-18 MED ORDER — PLASMA-LYTE 148 IV SOLN
INTRAVENOUS | Status: DC | PRN
  Administered 2018-04-18: 500 mL via INTRAVASCULAR

## 2018-04-18 MED ORDER — MORPHINE SULFATE (PF) 2 MG/ML IV SOLN
1.0000 mg | INTRAVENOUS | Status: DC | PRN
  Administered 2018-04-18 (×5): 2 mg via INTRAVENOUS
  Filled 2018-04-18 (×6): qty 1

## 2018-04-18 MED ORDER — SODIUM CHLORIDE 0.9 % IV SOLN
INTRAVENOUS | Status: DC
  Administered 2018-04-18: 15:00:00 via INTRAVENOUS

## 2018-04-18 MED ORDER — LACTATED RINGERS IV SOLN
INTRAVENOUS | Status: DC | PRN
  Administered 2018-04-18 (×2): via INTRAVENOUS

## 2018-04-18 MED ORDER — MIDAZOLAM HCL 5 MG/5ML IJ SOLN
INTRAMUSCULAR | Status: DC | PRN
  Administered 2018-04-18 (×4): 1 mg via INTRAVENOUS
  Administered 2018-04-18: 2 mg via INTRAVENOUS
  Administered 2018-04-18 (×2): 1 mg via INTRAVENOUS
  Administered 2018-04-18: 3 mg via INTRAVENOUS
  Administered 2018-04-18: 1 mg via INTRAVENOUS

## 2018-04-18 MED ORDER — ALBUMIN HUMAN 5 % IV SOLN
INTRAVENOUS | Status: DC | PRN
  Administered 2018-04-18 (×2): via INTRAVENOUS

## 2018-04-18 MED ORDER — VANCOMYCIN HCL IN DEXTROSE 1-5 GM/200ML-% IV SOLN
1000.0000 mg | Freq: Once | INTRAVENOUS | Status: AC
  Administered 2018-04-18: 1000 mg via INTRAVENOUS
  Filled 2018-04-18: qty 200

## 2018-04-18 MED ORDER — ONDANSETRON HCL 4 MG/2ML IJ SOLN
4.0000 mg | Freq: Four times a day (QID) | INTRAMUSCULAR | Status: DC | PRN
  Administered 2018-04-18 – 2018-04-19 (×2): 4 mg via INTRAVENOUS
  Filled 2018-04-18 (×2): qty 2

## 2018-04-18 MED ORDER — METOPROLOL TARTRATE 12.5 MG HALF TABLET
12.5000 mg | ORAL_TABLET | Freq: Once | ORAL | Status: AC
  Administered 2018-04-18: 12.5 mg via ORAL
  Filled 2018-04-18: qty 1

## 2018-04-18 MED ORDER — FAMOTIDINE IN NACL 20-0.9 MG/50ML-% IV SOLN
20.0000 mg | Freq: Two times a day (BID) | INTRAVENOUS | Status: DC
  Administered 2018-04-18: 20 mg via INTRAVENOUS

## 2018-04-18 MED ORDER — PROPOFOL 10 MG/ML IV BOLUS
INTRAVENOUS | Status: AC
  Filled 2018-04-18: qty 20

## 2018-04-18 MED ORDER — EPHEDRINE 5 MG/ML INJ
INTRAVENOUS | Status: AC
  Filled 2018-04-18: qty 10

## 2018-04-18 MED ORDER — ALBUTEROL SULFATE HFA 108 (90 BASE) MCG/ACT IN AERS
INHALATION_SPRAY | RESPIRATORY_TRACT | Status: DC | PRN
  Administered 2018-04-18: 2 via RESPIRATORY_TRACT

## 2018-04-18 MED ORDER — TRAMADOL HCL 50 MG PO TABS: 50.0000 mg | ORAL_TABLET | ORAL | Status: DC | PRN

## 2018-04-18 MED ORDER — MIDAZOLAM HCL 2 MG/2ML IJ SOLN: 2.0000 mg | INTRAMUSCULAR | Status: DC | PRN

## 2018-04-18 MED ORDER — FENTANYL CITRATE (PF) 250 MCG/5ML IJ SOLN
INTRAMUSCULAR | Status: DC | PRN
  Administered 2018-04-18: 50 ug via INTRAVENOUS
  Administered 2018-04-18 (×2): 150 ug via INTRAVENOUS
  Administered 2018-04-18: 100 ug via INTRAVENOUS
  Administered 2018-04-18: 50 ug via INTRAVENOUS
  Administered 2018-04-18 (×2): 150 ug via INTRAVENOUS
  Administered 2018-04-18: 100 ug via INTRAVENOUS
  Administered 2018-04-18: 50 ug via INTRAVENOUS
  Administered 2018-04-18 (×4): 100 ug via INTRAVENOUS

## 2018-04-18 MED ORDER — DEXMEDETOMIDINE HCL IN NACL 200 MCG/50ML IV SOLN
INTRAVENOUS | Status: AC
  Filled 2018-04-18: qty 50

## 2018-04-18 MED ORDER — NITROGLYCERIN IN D5W 200-5 MCG/ML-% IV SOLN: 0.0000 ug/min | INTRAVENOUS | Status: DC

## 2018-04-18 MED ORDER — DEXMEDETOMIDINE HCL IN NACL 200 MCG/50ML IV SOLN: 0.0000 ug/kg/h | INTRAVENOUS | Status: DC

## 2018-04-18 MED ORDER — ALBUTEROL SULFATE HFA 108 (90 BASE) MCG/ACT IN AERS
INHALATION_SPRAY | RESPIRATORY_TRACT | Status: AC
  Filled 2018-04-18: qty 6.7

## 2018-04-18 MED ORDER — MAGNESIUM SULFATE 4 GM/100ML IV SOLN
4.0000 g | Freq: Once | INTRAVENOUS | Status: AC
  Administered 2018-04-18: 4 g via INTRAVENOUS
  Filled 2018-04-18: qty 100

## 2018-04-18 MED ORDER — PROTAMINE SULFATE 10 MG/ML IV SOLN
INTRAVENOUS | Status: AC
  Filled 2018-04-18: qty 5

## 2018-04-18 MED ORDER — FENTANYL CITRATE (PF) 250 MCG/5ML IJ SOLN
INTRAMUSCULAR | Status: AC
  Filled 2018-04-18: qty 5

## 2018-04-18 MED ORDER — CHLORHEXIDINE GLUCONATE 0.12 % MT SOLN
15.0000 mL | Freq: Once | OROMUCOSAL | Status: AC
  Administered 2018-04-18: 15 mL via OROMUCOSAL
  Filled 2018-04-18: qty 15

## 2018-04-18 MED ORDER — LACTATED RINGERS IV SOLN
INTRAVENOUS | Status: DC
  Administered 2018-04-18: 14:00:00 via INTRAVENOUS

## 2018-04-18 MED ORDER — SODIUM CHLORIDE 0.45 % IV SOLN: INTRAVENOUS | Status: DC | PRN

## 2018-04-18 MED ORDER — PROTAMINE SULFATE 10 MG/ML IV SOLN
INTRAVENOUS | Status: DC | PRN
  Administered 2018-04-18: 10 mg via INTRAVENOUS
  Administered 2018-04-18: 390 mg via INTRAVENOUS

## 2018-04-18 MED ORDER — LACTATED RINGERS IV SOLN: INTRAVENOUS | Status: DC

## 2018-04-18 MED ORDER — ONDANSETRON HCL 4 MG/2ML IJ SOLN
INTRAMUSCULAR | Status: DC | PRN
  Administered 2018-04-18: 4 mg via INTRAVENOUS

## 2018-04-18 MED ORDER — ALBUMIN HUMAN 5 % IV SOLN: 250.0000 mL | INTRAVENOUS | Status: AC | PRN

## 2018-04-18 MED ORDER — DEXAMETHASONE SODIUM PHOSPHATE 10 MG/ML IJ SOLN
INTRAMUSCULAR | Status: DC | PRN
  Administered 2018-04-18: 10 mg via INTRAVENOUS

## 2018-04-18 MED ORDER — METOPROLOL TARTRATE 5 MG/5ML IV SOLN: 2.5000 mg | INTRAVENOUS | Status: DC | PRN

## 2018-04-18 MED ORDER — ACETAMINOPHEN 500 MG PO TABS
1000.0000 mg | ORAL_TABLET | Freq: Four times a day (QID) | ORAL | Status: DC
  Administered 2018-04-19 – 2018-04-23 (×16): 1000 mg via ORAL
  Filled 2018-04-18 (×17): qty 2

## 2018-04-18 MED ORDER — PANTOPRAZOLE SODIUM 40 MG PO TBEC
40.0000 mg | DELAYED_RELEASE_TABLET | Freq: Every day | ORAL | Status: DC
  Administered 2018-04-20 – 2018-04-23 (×4): 40 mg via ORAL
  Filled 2018-04-18 (×4): qty 1

## 2018-04-18 MED ORDER — DOCUSATE SODIUM 100 MG PO CAPS
200.0000 mg | ORAL_CAPSULE | Freq: Every day | ORAL | Status: DC
  Administered 2018-04-19 – 2018-04-23 (×5): 200 mg via ORAL
  Filled 2018-04-18 (×5): qty 2

## 2018-04-18 MED ORDER — FENTANYL CITRATE (PF) 250 MCG/5ML IJ SOLN
INTRAMUSCULAR | Status: AC
  Filled 2018-04-18: qty 25

## 2018-04-18 MED ORDER — INSULIN REGULAR BOLUS VIA INFUSION
0.0000 [IU] | Freq: Three times a day (TID) | INTRAVENOUS | Status: DC
  Administered 2018-04-19: 3.2 [IU] via INTRAVENOUS
  Filled 2018-04-18: qty 10

## 2018-04-18 MED ORDER — HEPARIN SODIUM (PORCINE) 1000 UNIT/ML IJ SOLN
INTRAMUSCULAR | Status: DC | PRN
  Administered 2018-04-18: 40000 [IU] via INTRAVENOUS

## 2018-04-18 MED ORDER — ASPIRIN EC 325 MG PO TBEC
325.0000 mg | DELAYED_RELEASE_TABLET | Freq: Every day | ORAL | Status: DC
  Administered 2018-04-19 – 2018-04-23 (×5): 325 mg via ORAL
  Filled 2018-04-18 (×5): qty 1

## 2018-04-18 MED ORDER — PROPOFOL 10 MG/ML IV BOLUS
INTRAVENOUS | Status: DC | PRN
  Administered 2018-04-18: 150 mg via INTRAVENOUS

## 2018-04-18 MED ORDER — TRANEXAMIC ACID 1000 MG/10ML IV SOLN
0.5000 mg/kg/h | INTRAVENOUS | Status: DC
  Filled 2018-04-18: qty 25

## 2018-04-18 MED ORDER — CHLORHEXIDINE GLUCONATE 0.12 % MT SOLN: 15.0000 mL | OROMUCOSAL | Status: AC

## 2018-04-18 MED ORDER — ACETAMINOPHEN 160 MG/5ML PO SOLN: 1000.0000 mg | Freq: Four times a day (QID) | ORAL | Status: DC

## 2018-04-18 MED ORDER — SODIUM CHLORIDE 0.9 % IV SOLN
1.5000 g | Freq: Two times a day (BID) | INTRAVENOUS | Status: AC
  Administered 2018-04-18 – 2018-04-20 (×4): 1.5 g via INTRAVENOUS
  Filled 2018-04-18 (×4): qty 1.5

## 2018-04-18 MED ORDER — CHLORHEXIDINE GLUCONATE 4 % EX LIQD: 30.0000 mL | CUTANEOUS | Status: DC

## 2018-04-18 MED ORDER — ACETAMINOPHEN 650 MG RE SUPP: 650.0000 mg | Freq: Once | RECTAL | Status: DC

## 2018-04-18 SURGICAL SUPPLY — 120 items
ADAPTER CARDIO PERF ANTE/RETRO (ADAPTER) ×3 IMPLANT
APPLICATOR COTTON TIP 6IN STRL (MISCELLANEOUS) IMPLANT
ATTRACTOMAT 16X20 MAGNETIC DRP (DRAPES) ×3 IMPLANT
BAG DECANTER FOR FLEXI CONT (MISCELLANEOUS) ×3 IMPLANT
BLADE STERNUM SYSTEM 6 (BLADE) ×3 IMPLANT
BLADE SURG 11 STRL SS (BLADE) ×3 IMPLANT
CANISTER SUCT 3000ML PPV (MISCELLANEOUS) ×3 IMPLANT
CANN PRFSN 3/8X14X24FR PCFC (MISCELLANEOUS)
CANN PRFSN 3/8XCNCT ST RT ANG (MISCELLANEOUS)
CANNULA AORTIC ROOT 9FR (CANNULA) ×3 IMPLANT
CANNULA EZ GLIDE AORTIC 21FR (CANNULA) ×3 IMPLANT
CANNULA FEM VENOUS REMOTE 22FR (CANNULA) ×3 IMPLANT
CANNULA FEMORAL ART 14 SM (MISCELLANEOUS) ×3 IMPLANT
CANNULA GUNDRY RCSP 15FR (MISCELLANEOUS) IMPLANT
CANNULA PRFSN 3/8X14X24FR PCFC (MISCELLANEOUS) IMPLANT
CANNULA PRFSN 3/8XCNCT RT ANG (MISCELLANEOUS) IMPLANT
CANNULA SUMP PERICARDIAL (CANNULA) ×3 IMPLANT
CANNULA VEN MTL TIP RT (MISCELLANEOUS)
CARDIOBLATE CARDIAC ABLATION (MISCELLANEOUS)
CATH ROBINSON RED A/P 18FR (CATHETERS) ×3 IMPLANT
CATH THORACIC 28FR RT ANG (CATHETERS) IMPLANT
CATH THORACIC 36FR (CATHETERS) IMPLANT
CLIP FOGARTY SPRING 6M (CLIP) IMPLANT
CLIP VESOCCLUDE MED 24/CT (CLIP) ×6 IMPLANT
CONN 1/2X1/2X1/2  BEN (MISCELLANEOUS) ×1
CONN 1/2X1/2X1/2 BEN (MISCELLANEOUS) ×2 IMPLANT
CONN 3/8X1/2 ST GISH (MISCELLANEOUS) ×6 IMPLANT
CONN ST 1/4X3/8  BEN (MISCELLANEOUS) ×1
CONN ST 1/4X3/8 BEN (MISCELLANEOUS) ×2 IMPLANT
CONNECTOR 1/2X3/8X1/2 3 WAY (MISCELLANEOUS) ×1
CONNECTOR 1/2X3/8X1/2 3WAY (MISCELLANEOUS) ×2 IMPLANT
COVER SURGICAL LIGHT HANDLE (MISCELLANEOUS) ×6 IMPLANT
COVER WAND RF STERILE (DRAPES) ×3 IMPLANT
CRADLE DONUT ADULT HEAD (MISCELLANEOUS) ×3 IMPLANT
DERMABOND ADVANCED (GAUZE/BANDAGES/DRESSINGS) ×1
DERMABOND ADVANCED .7 DNX12 (GAUZE/BANDAGES/DRESSINGS) ×2 IMPLANT
DEVICE CARDIOBLATE CARDIAC ABL (MISCELLANEOUS) IMPLANT
DEVICE CLOSURE PERCLS PRGLD 6F (VASCULAR PRODUCTS) ×2 IMPLANT
DEVICE SUT CK QUICK LOAD INDV (Prosthesis & Implant Heart) ×6 IMPLANT
DEVICE SUT CK QUICK LOAD MINI (Prosthesis & Implant Heart) ×3 IMPLANT
DRAIN CHANNEL 32F RND 10.7 FF (WOUND CARE) ×6 IMPLANT
DRAPE CARDIOVASCULAR INCISE (DRAPES) ×1
DRAPE PERI GROIN 82X75IN TIB (DRAPES) ×6 IMPLANT
DRAPE SRG 135X102X78XABS (DRAPES) ×2 IMPLANT
DRSG AQUACEL AG ADV 3.5X14 (GAUZE/BANDAGES/DRESSINGS) ×3 IMPLANT
DRSG COVADERM 4X14 (GAUZE/BANDAGES/DRESSINGS) ×3 IMPLANT
ELECT REM PT RETURN 9FT ADLT (ELECTROSURGICAL) ×6
ELECTRODE REM PT RTRN 9FT ADLT (ELECTROSURGICAL) ×4 IMPLANT
FELT TEFLON 1X6 (MISCELLANEOUS) ×6 IMPLANT
GAUZE SPONGE 4X4 12PLY STRL (GAUZE/BANDAGES/DRESSINGS) ×6 IMPLANT
GAUZE SPONGE 4X4 12PLY STRL LF (GAUZE/BANDAGES/DRESSINGS) ×3 IMPLANT
GLOVE BIO SURGEON STRL SZ 6 (GLOVE) ×18 IMPLANT
GLOVE BIO SURGEON STRL SZ 6.5 (GLOVE) ×18 IMPLANT
GLOVE BIO SURGEON STRL SZ7 (GLOVE) IMPLANT
GLOVE BIO SURGEON STRL SZ7.5 (GLOVE) IMPLANT
GLOVE ORTHO TXT STRL SZ7.5 (GLOVE) ×6 IMPLANT
GOWN STRL REUS W/ TWL LRG LVL3 (GOWN DISPOSABLE) ×12 IMPLANT
GOWN STRL REUS W/TWL LRG LVL3 (GOWN DISPOSABLE) ×6
HEMOSTAT POWDER SURGIFOAM 1G (HEMOSTASIS) ×15 IMPLANT
INSERT FOGARTY XLG (MISCELLANEOUS) IMPLANT
KIT BASIN OR (CUSTOM PROCEDURE TRAY) ×3 IMPLANT
KIT DILATOR VASC 18G NDL (KITS) ×3 IMPLANT
KIT DRAINAGE VACCUM ASSIST (KITS) ×3 IMPLANT
KIT SUCTION CATH 14FR (SUCTIONS) ×3 IMPLANT
KIT SUT CK MINI COMBO 4X17 (Prosthesis & Implant Heart) ×3 IMPLANT
KIT TURNOVER KIT B (KITS) ×3 IMPLANT
LINE VENT (MISCELLANEOUS) ×3 IMPLANT
LOOP VESSEL SUPERMAXI WHITE (MISCELLANEOUS) ×3 IMPLANT
MARKER GRAFT CORONARY BYPASS (MISCELLANEOUS) IMPLANT
NS IRRIG 1000ML POUR BTL (IV SOLUTION) ×15 IMPLANT
PACK OPEN HEART (CUSTOM PROCEDURE TRAY) ×3 IMPLANT
PAD ARMBOARD 7.5X6 YLW CONV (MISCELLANEOUS) ×6 IMPLANT
PERCLOSE PROGLIDE 6F (VASCULAR PRODUCTS) ×3
RING TRICUSPID T30 (Prosthesis & Implant Heart) ×3 IMPLANT
SET CARDIOPLEGIA MPS 5001102 (MISCELLANEOUS) ×3 IMPLANT
SHEATH PINNACLE 6F 10CM (SHEATH) ×3 IMPLANT
SHEATH PINNACLE 8F 10CM (SHEATH) ×3 IMPLANT
SOLUTION ANTI FOG 6CC (MISCELLANEOUS) ×3 IMPLANT
SPONGE LAP 18X18 X RAY DECT (DISPOSABLE) ×3 IMPLANT
SUCKER INTRACARDIAC WEIGHTED (SUCKER) ×3 IMPLANT
SUT BONE WAX W31G (SUTURE) IMPLANT
SUT ETHIBOND 2 0 SH (SUTURE) ×8 IMPLANT
SUT ETHIBOND 2 0 SH 36X2 (SUTURE) ×4 IMPLANT
SUT ETHIBOND 2 0 V4 (SUTURE) IMPLANT
SUT ETHIBOND 2 0V4 GREEN (SUTURE) IMPLANT
SUT ETHIBOND 2-0 RB-1 WHT (SUTURE) ×6 IMPLANT
SUT ETHIBOND 4 0 TF (SUTURE) IMPLANT
SUT ETHIBOND 5 0 C 1 30 (SUTURE) ×3 IMPLANT
SUT ETHIBOND X763 2 0 SH 1 (SUTURE) IMPLANT
SUT GORETEX CV-5THC-13 36IN (SUTURE) ×18 IMPLANT
SUT MNCRL AB 3-0 PS2 18 (SUTURE) IMPLANT
SUT PDS AB 1 CTX 36 (SUTURE) IMPLANT
SUT PROLENE 3 0 SH 1 (SUTURE) ×3 IMPLANT
SUT PROLENE 3 0 SH DA (SUTURE) ×3 IMPLANT
SUT PROLENE 3 0 SH1 36 (SUTURE) ×12 IMPLANT
SUT PROLENE 4 0 RB 1 (SUTURE) ×4
SUT PROLENE 4 0 SH DA (SUTURE) ×6 IMPLANT
SUT PROLENE 4-0 RB1 .5 CRCL 36 (SUTURE) ×8 IMPLANT
SUT PROLENE 5 0 C 1 36 (SUTURE) ×9 IMPLANT
SUT PROLENE 6 0 C 1 30 (SUTURE) ×3 IMPLANT
SUT PTFE CHORD X 12MM (SUTURE) ×3 IMPLANT
SUT SILK  1 MH (SUTURE) ×2
SUT SILK 1 MH (SUTURE) ×4 IMPLANT
SUT STEEL 6MS V (SUTURE) IMPLANT
SUT STEEL STERNAL CCS#1 18IN (SUTURE) ×6 IMPLANT
SUT STEEL SZ 6 DBL 3X14 BALL (SUTURE) ×3 IMPLANT
SUT VIC AB 1 CTX 18 (SUTURE) ×3 IMPLANT
SUT VIC AB 2-0 CTX 27 (SUTURE) IMPLANT
SYSTEM SAHARA CHEST DRAIN ATS (WOUND CARE) ×3 IMPLANT
TAPE CLOTH SURG 4X10 WHT LF (GAUZE/BANDAGES/DRESSINGS) ×3 IMPLANT
TAPE PAPER 2X10 WHT MICROPORE (GAUZE/BANDAGES/DRESSINGS) ×3 IMPLANT
TAPE UMBILICAL COTTON 1/8X30 (MISCELLANEOUS) ×3 IMPLANT
TOWEL GREEN STERILE (TOWEL DISPOSABLE) ×3 IMPLANT
TOWEL GREEN STERILE FF (TOWEL DISPOSABLE) ×3 IMPLANT
TRAY FOLEY SLVR 14FR TEMP STAT (SET/KITS/TRAYS/PACK) IMPLANT
TUBE SUCT INTRACARD DLP 20F (MISCELLANEOUS) ×3 IMPLANT
TUBING INSUFFLATION 10FT LAP (TUBING) ×3 IMPLANT
UNDERPAD 30X30 (UNDERPADS AND DIAPERS) ×3 IMPLANT
WATER STERILE IRR 1000ML POUR (IV SOLUTION) ×6 IMPLANT
WIRE EMERALD 3MM-J .035X150CM (WIRE) ×3 IMPLANT

## 2018-04-18 NOTE — Brief Op Note (Signed)
04/18/2018  1:14 PM  PATIENT:  Heather Day  33 y.o. female  PRE-OPERATIVE DIAGNOSIS:   MODERATE to SEVERE TR SECONDARY to ENDOCARDITIS  POST-OPERATIVE DIAGNOSIS:  MODERATE to SEVERE TR SECONDARY to ENDOCARDITIS  PROCEDURE:  TRANSESOPHAGEAL ECHOCARDIOGRAM (TEE), MEDIAN STERNOTOMY for COMPLEX TRICUSPID VALVULOPLASTY and TRICUSPID VALVE REPAIR (using a MC3 Annuloplasty ring Model# [id number],Serial # [id number], size 30)  SURGEON:  Surgeon(s) and Role:    Purcell Nails, MD - Primary  PHYSICIAN ASSISTANT: Doree Fudge PA-C  ASSISTANTS: Virgilio Frees RNFA   ANESTHESIA:   general  EBL:  550 mL   DRAINS: Chest tubes placed in the mediastinal and pleural spaces   COUNTS CORRECT:  YES  DICTATION: .Dragon Dictation  PLAN OF CARE: Admit to inpatient   PATIENT DISPOSITION:  ICU - intubated and hemodynamically stable.   Delay start of Pharmacological VTE agent (>24hrs) due to surgical blood loss or risk of bleeding: yes   BASELINE WEIGHT: 113 kg

## 2018-04-18 NOTE — Anesthesia Procedure Notes (Signed)
Procedure Name: Intubation Date/Time: 04/18/2018 8:25 AM Performed by: Clearnce Sorrel, CRNA Pre-anesthesia Checklist: Patient identified, Emergency Drugs available, Suction available, Patient being monitored and Timeout performed Patient Re-evaluated:Patient Re-evaluated prior to induction Oxygen Delivery Method: Circle system utilized Preoxygenation: Pre-oxygenation with 100% oxygen Induction Type: IV induction Ventilation: Mask ventilation without difficulty and Oral airway inserted - appropriate to patient size Laryngoscope Size: Mac and 3 Tube type: Oral Tube size: 8.0 mm Number of attempts: 1 Airway Equipment and Method: Stylet Placement Confirmation: ETT inserted through vocal cords under direct vision,  positive ETCO2 and breath sounds checked- equal and bilateral Secured at: 23 cm Tube secured with: Tape Dental Injury: Teeth and Oropharynx as per pre-operative assessment

## 2018-04-18 NOTE — H&P (Signed)
[address]       Heather Day [address]             [phone]          CARDIOTHORACIC SURGERY HISTORY AND PHYSICAL EXAM  Referring Provider is Heather Lea, MD PCP is Heather Day, Provider, MD      Chief Complaint  Patient presents with  . 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  The patient states that she was a heroin addict for approximately 10 years.  Approximately 3 years ago she was hospitalized with right-sided bacterial endocarditis complicated by severe tricuspid regurgitation and septic embolization to the lung.  She states that she was actually scheduled for tricuspid valve replacement but she left the hospital AMA.  At the time she lived in [place].  She was subsequently incarcerated in prison for a little over a year.  When she got out of prison she initially went back to using drugs but within 3 weeks made a decision to clean up her life.  She became completely abstinent and moved to [place] to live with her cousin.  She claims to have remained drug-free ever since, now approximately 15 months.  She developed progressive symptoms of atypical chest pain, shortness of breath, and severe generalized edema.  She was initially evaluated by Dr. Bing Day in December 2018, and she has been followed carefully ever since.  In April 2019 she developed acute appendicitis and underwent laparoscopic appendectomy by Dr. Daphine Day.  CT scan performed at that time revealed cholelithiasis.  She recovered uneventfully.  In May she was evaluated in the emergency room and diagnosed with biliary colic.  In June she was seen in  follow-up by Dr. Bing Day and notably complaining of worsened abdominal swelling and swelling in both arms and legs.  She has had atypical chest pain and palpitations.  Her weight had been trending up.  She underwent a Holter monitor that revealed only sinus rhythm with no suggestion of any sustained arrhythmias.  Follow-up echocardiogram revealed normal left ventricular systolic function with moderate right atrial enlargement and moderate to severe tricuspid regurgitation.  The tricuspid regurgitation reportedly looked worse than previous echocardiograms and there was question of a possible flail leaflet of the tricuspid valve.  TEE was performed October 03, 2017 and confirmed the presence of severe tricuspid regurgitation.  There was normal left ventricular systolic function with ejection fraction estimated 55 to 60%.  The aortic and mitral valves were normal.  The patient was subsequently referred for elective surgical consultation.  The patient is single and lives with her boyfriend in [place].  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  She complains of progressive exertional shortness of breath with swelling of both arms, both legs, and her abdomen.  Symptoms are transiently improved by diuretic therapy with Lasix but the patient continues to struggle.  She denies any exertional chest pain or chest tightness.  She reports occasional fleeting tightness across her chest.  She reports occasional palpitations without dizzy spells or syncope.  She has continued to gain weight, currently weighing 300 pounds.  She reports intermittent episodes of sharp right upper quadrant abdominal pain typically developed  45 minutes to 1 hour after meals.  Symptoms are sometimes crampy in nature and usually subside.  She states that she weighed 160 pounds when she was in jail 3 years ago.  She maintains that she quit smoking last February and she has not used any type of illicit drug since she moved to [place] 15 months ago.  She had a negative drug screen at the time of her initial evaluation in November 2018.  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  She was originally seen in consultation on October 30, 2017.  Since then she underwent dental extraction by Dr. Robin SearingKulinsky, and more recently she underwent laparoscopic cholecystectomy by Dr. Johna Day.  She recovered from both of these uneventfully, and she was recently seen in follow-up by Dr. Bing Day.  She has developed worsening symptoms of exertional shortness of breath, for which her dose of Lasix has recently been increased.  She has been referred back to our office today for follow-up to further discuss possible elective tricuspid valve repair or replacement.  She states that her breathing is comfortable at rest but she gets short of breath with moderate level activity.  She has had some increased shortness of breath, fluid retention, and mild lower extremity edema.  She denies any fevers, chills, or productive cough.  She has not had any abdominal pain.  She does not smoke cigarettes and she has been drug-free since July 2018.  The remainder of her review of systems is unremarkable.  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 She was originally seen in consultation on October 30, 2017 and she was last seen here in our office recently on March 28, 2018.  Since then she underwent follow-up echocardiogram and coronary CT angiography.  Echocardiogram revealed  normal left ventricular systolic function with only mild right ventricular chamber enlargement and normal right ventricular function.  There remains severe tricuspid regurgitation.  No other significant abnormalities were noted.  Coronary CT angiography revealed normal coronary artery anatomy with no sign of any significant coronary artery disease.  Patient returns for office today with tentative plans to proceed with elective mitral valve repair or replacement next week.  She reports no new problems or complaints.  Past Medical History:  Diagnosis Date  . Anxiety    panic attacks  . Bipolar disorder (HCC)   . Drug abuse, IV (HCC)   . Dyspnea    when walking up stairs  . Gallstone   . GERD (gastroesophageal reflux disease)   . Heartburn   . Hepatitis C   . Periodontitis chronic, apical    dental caries  . PTSD (post-traumatic stress disorder)   . Severe tricuspid regurgitation   . Wears glasses     Past Surgical History:  Procedure Laterality Date  . APPENDECTOMY    . CESAREAN SECTION    . CHOLECYSTECTOMY N/A 01/15/2018   Procedure: LAPAROSCOPIC CHOLECYSTECTOMY ERAS PATHWAY;  Surgeon: Glenna FellowsHoxworth, Benjamin, MD;  [facility];  Service: General;  Laterality: N/A;  . INTRAOPERATIVE CHOLANGIOGRAM N/A 01/15/2018   Procedure: INTRAOPERATIVE CHOLANGIOGRAM;  Surgeon: Glenna FellowsHoxworth, Benjamin, MD;  Location: [facility] OR;  Service: General;  Laterality: N/A;  . LAPAROSCOPIC APPENDECTOMY N/A 07/04/2017   Procedure: APPENDECTOMY LAPAROSCOPIC;  Surgeon: Luretha Murphy, MD;  Location: [facility] ORS;  Service: General;  Laterality: N/A;  . MULTIPLE EXTRACTIONS WITH ALVEOLOPLASTY Bilateral 11/29/2017   Procedure: Extraction of tooth #'s 2,5-15, and 31 with alveoloplasty and gross debridement of remaining dentition;  Surgeon: Charlynne Pander, DDS;  Location: [facility] OR;  Service: Oral Surgery;  Laterality: Bilateral;  . TEE WITHOUT CARDIOVERSION N/A 10/03/2017   Procedure: TRANSESOPHAGEAL ECHOCARDIOGRAM (TEE);  Surgeon:  Jodelle Red, MD;  [facility] ENDOSCOPY;  Service: Cardiovascular;  Laterality: N/A;    Family History  Problem Relation Age of Onset  . Hypothyroidism Mother   . Hypotension Mother   . Healthy Father     Social History Social History   Tobacco Use  . Smoking status: Former Smoker    Packs/day: 1.00    Years: 20.00    Pack years: 20.00    Types: Cigarettes    Last attempt to quit: 04/26/2017    Years since quitting: 0.9  . Smokeless tobacco: Never Used  Substance Use Topics  . Alcohol use: No  . Drug use: Not Currently    Types: IV, Heroin    Comment: clean since 10/09/16    Prior to Admission medications   Medication Sig Start Date End Date Taking? Authorizing Provider  furosemide (LASIX) 80 MG tablet Take 0.5 tablets (40 mg total) by mouth daily. Patient taking differently: Take 80 mg by mouth daily.  12/06/17 04/16/18 Yes Heather Lea, MD  ibuprofen (ADVIL,MOTRIN) 200 MG tablet Take 600 mg by mouth every 6 (six) hours as needed for moderate pain.    Yes [provider]  methadone (DOLOPHINE) 10 MG/5ML solution Take 85 mg by mouth daily.    Yes [provider]  omeprazole (PRILOSEC) 20 MG capsule Take 1 capsule (20 mg total) by mouth 2 (two) times daily before a meal. 03/16/17  Yes Heather Lea, MD  potassium chloride (K-DUR,KLOR-CON) 10 MEQ tablet Take 10 mEq by mouth daily. 11/02/17  Yes [provider]  metoprolol tartrate (LOPRESSOR) 100 MG tablet Please take 100 mg once 2 hours prior to procedure. 03/29/18   Purcell Nails, MD    No Known Allergies   Review of Systems:              General:                      decreased appetite, decreased energy, + weight gain, no weight loss, no fever             Cardiac:                       no chest pain with exertion, fleeting atypical chest pain at rest, +SOB with low level exertion, occasional resting SOB, no PND, + orthopnea, + palpitations, no arrhythmia, no atrial  fibrillation, + LE edema, + dizzy spells, no syncope             Respiratory:                 + shortness of breath, no home oxygen, no productive cough, + dry cough, no bronchitis, + wheezing, no hemoptysis, no asthma, no pain with inspiration or cough, no sleep apnea, no CPAP at night             GI:  no difficulty swallowing, no reflux, + frequent heartburn, no hiatal hernia, + abdominal pain, no constipation, no diarrhea, no hematochezia, no hematemesis, no melena             GU:                              no dysuria,  no frequency, no urinary tract infection, no hematuria, no kidney stones, no kidney disease             Vascular:                     no pain suggestive of claudication, no pain in feet, + leg cramps, + varicose veins, no DVT, no non-healing foot ulcer             Neuro:                         no stroke, no TIA's, no seizures, no headaches, no temporary blindness one eye,  no slurred speech, no peripheral neuropathy, no chronic pain, no instability of gait, no memory/cognitive dysfunction             Musculoskeletal:         no arthritis, no joint swelling, no myalgias, no difficulty walking, normal mobility              Skin:                            no rash, no itching, no skin infections, no pressure sores or ulcerations             Psych:                         + anxiety, + depression, + nervousness, no unusual recent stress             Eyes:                           no blurry vision, no floaters, no recent vision changes, does not wears glasses or contacts             ENT:                            no hearing loss, no loose or painful teeth, no dentures, last saw dentist 3 years ago while in hospital             Hematologic:               no easy bruising, no abnormal bleeding, no clotting disorder, no frequent epistaxis             Endocrine:                   no diabetes, does not check CBG's at home                           Physical  Exam:              BP (!) 104/54   Pulse 74   Resp 20   Ht 5\' 8"  (1.727 m)   Wt 300 lb (136.1 kg)   LMP 10/09/2017  SpO2 98% Comment: RA  BMI 45.61 kg/m              General:                      Morbidly obese,  well-appearing             HEENT:                       Unremarkable              Neck:                           no JVD, no bruits, no adenopathy              Chest:                          clear to auscultation, symmetrical breath sounds, no wheezes, no rhonchi              CV:                              RRR, no  murmur              Abdomen:                    soft, non-tender, distended, no masses              Extremities:                 warm, well-perfused, pulses palpable, mild LE edema             Rectal/GU                   Deferred             Neuro:                         Grossly non-focal and symmetrical throughout             Skin:                            Clean and dry, no rashes, no breakdown   Diagnostic Tests:  Transthoracic Echocardiography  (Report amended )  Patient: Heather, Day MR #: [id number] Study Date: 09/24/2017 Gender: F Age: 67 Height: 172.7 cm Weight: 134.6 kg BSA: 2.61 m^2 Pt. Status: Room:  ATTENDING Heather Day, Provider [phone] ORDERING [name], MD REFERRING [name], MD PERFORMING [facility] SONOGRAPHER Sinda Du, RDCS  cc:  ------------------------------------------------------------------- LV EF: 55% - 60%  ------------------------------------------------------------------- History: PMH: Palpitations  Dizziness History of endocarditis. Tricuspid valve regurgitation. Endocarditis.  ------------------------------------------------------------------- Study Conclusions  - Left ventricle: The cavity size was normal. Systolic function was normal. The estimated ejection fraction was in the range of 55% to  60%. Wall motion was normal; there were no regional wall motion abnormalities. Left ventricular diastolic function parameters were normal. - Right atrium: The atrium was mildly to moderately dilated. - Tricuspid valve: There was moderate-severe regurgitation.  Impressions:  - Normal LVEF. Moderate RA enlargement. Mderate - severe TR. Normal PAP. TR is worst as compare to previous echo from 2018. Possibly flail TV leaflet.  ------------------------------------------------------------------- Study  data: Comparison was made to the study of 03/09/2017. Study status: Routine. Procedure: Transthoracic echocardiography. Image quality was adequate. Study completion: There were no complications. Transthoracic echocardiography. M-mode, complete 2D, spectral Doppler, and color Doppler. Birthdate: Patient birthdate: 1986-02-24. Age: Patient is 33 yr old. Sex: Gender: female. BMI: 45.1 kg/m^2. Blood pressure: 114/81 Patient status: Outpatient. Study date: Study date: 09/24/2017. Study time: 12:45 PM. Location: Echo laboratory.  -------------------------------------------------------------------  ------------------------------------------------------------------- Left ventricle: The cavity size was normal. Systolic function was normal. The estimated ejection fraction was in the range of 55% to 60%. Wall motion was normal; there were no regional wall motion abnormalities. The transmitral flow pattern was normal. The deceleration time of the early transmitral flow velocity was normal. The pulmonary vein flow pattern was normal. The tissue Doppler parameters were normal. Left ventricular diastolic function parameters were normal.  ------------------------------------------------------------------- Aortic valve: Structurally normal valve. Trileaflet; normal thickness leaflets. Cusp separation was normal. Mobility was not restricted.  Doppler: Transvalvular velocity was within the normal range. There was no stenosis. There was no regurgitation.  ------------------------------------------------------------------- Aorta: Aortic root: The aortic root was normal in size.  ------------------------------------------------------------------- Mitral valve: Structurally normal valve. Mobility was not restricted. Doppler: Transvalvular velocity was within the normal range. There was no evidence for stenosis. There was no regurgitation.  ------------------------------------------------------------------- Left atrium: The atrium was normal in size.  ------------------------------------------------------------------- Right ventricle: The cavity size was normal. Wall thickness was normal. Systolic function was normal.  ------------------------------------------------------------------- Pulmonic valve: Doppler: Transvalvular velocity was within the normal range. There was no evidence for stenosis. There was trivial regurgitation.  ------------------------------------------------------------------- Tricuspid valve: Structurally normal valve. Doppler: Transvalvular velocity was within the normal range. There was moderate-severe regurgitation.  ------------------------------------------------------------------- Pulmonary artery: The main pulmonary artery was normal-sized. Systolic pressure was within the normal range.  ------------------------------------------------------------------- Right atrium: The atrium was mildly to moderately dilated.  ------------------------------------------------------------------- Pericardium: There was no pericardial effusion.  ------------------------------------------------------------------- Systemic veins: Inferior vena cava: The vessel was normal in size.  ------------------------------------------------------------------- Measurements  Left  ventricle Value Reference LV ID, ED, PLAX chordal (H) 55.3 mm 43 - 52 LV ID, ES, PLAX chordal 34 mm 23 - 38 LV fx shortening, PLAX chordal 39 % >=29 LV PW thickness, ED 8.16 mm ---------- IVS/LV PW ratio, ED 1.14 <=1.3 Stroke volume, 2D 58 ml ---------- Stroke volume/bsa, 2D 22 ml/m^2 ---------- LV e&', lateral 13.4 cm/s ---------- LV E/e&', lateral 3.67 ---------- LV e&', medial 11.5 cm/s ---------- LV E/e&', medial 4.28 ---------- LV e&', average 12.45 cm/s ---------- LV E/e&', average 3.95 ----------  Ventricular septum Value Reference IVS thickness, ED 9.28 mm ----------  LVOT Value Reference LVOT ID, S 20 mm ---------- LVOT area 3.14 cm^2 ---------- LVOT peak velocity, S 83.6 cm/s ---------- LVOT mean velocity, S 58 cm/s ---------- LVOT VTI, S 18.5 cm ----------  Aorta Value Reference Aortic root ID, ED 28 mm ---------- Ascending aorta ID, A-P, S 31 mm ----------  Left atrium Value Reference LA ID, A-P, ES 40 mm ---------- LA ID/bsa, A-P 1.53 cm/m^2 <=2.2 LA volume, S 52.4 ml ---------- LA volume/bsa, S 20.1  ml/m^2 ---------- LA volume, ES, 1-p A4C 47.7 ml ---------- LA volume/bsa, ES, 1-p A4C 18.3 ml/m^2 ---------- LA volume, ES, 1-p A2C 50.1 ml ---------- LA volume/bsa, ES, 1-p A2C 19.2 ml/m^2 ----------  Mitral valve Value Reference Mitral E-wave peak velocity 49.2 cm/s ---------- Mitral A-wave peak velocity 33.1 cm/s ---------- Mitral deceleration time (H) 327 ms 150 - 230 Mitral E/A ratio, peak 1.5 ----------  Pulmonary arteries Value  Reference PA pressure, S, DP 26 mm Hg <=30  Tricuspid valve Value Reference Tricuspid regurg peak velocity 238 cm/s ---------- Tricuspid peak RV-RA gradient 23 mm Hg ----------  Right atrium Value Reference RA ID, S-I, ES, A4C (H) 62.4 mm 34 - 49 RA area, ES, A4C (H) 26.2 cm^2 8.3 - 19.5 RA volume, ES, A/L 91.4 ml ---------- RA volume/bsa, ES, A/L 35 ml/m^2 ----------  Systemic veins Value Reference Estimated CVP 3 mm Hg ----------  Right ventricle Value Reference RV ID, minor axis, ED, A4C base 52.2 mm ---------- TAPSE 27.7 mm ---------- RV pressure, S, DP 26 mm Hg <=30 RV s&', lateral, S 14 cm/s ----------  Legend: (L) and (H) mark values outside specified reference range.  ------------------------------------------------------------------- Heather Havens,  MD 2019-07-15T15:48:34   Transesophageal Echocardiography  Patient: Heather, Day MR #: [id number] Study Date: 10/03/2017 Gender: F Age: 36 Height: 172.7 cm Weight: 134.3 kg BSA: 2.61 m^2 Pt. Status: Room:  SONOGRAPHER Perley Jain, RDCS ORDERING Cristal Deer, Bridgette PERFORMING Cristal Deer, Bridgette  cc:  ------------------------------------------------------------------- LV EF: 55% - 60%  ------------------------------------------------------------------- Indications: Tricuspid regurgitation.  ------------------------------------------------------------------- Study Conclusions  - Left ventricle: Systolic function was normal. The estimated ejection fraction was in the range of 55% to 60%. Wall motion was normal; there were no regional wall motion abnormalities. - Aortic valve: There was no regurgitation. - Mitral valve: There was trivial regurgitation. - Left atrium: No evidence of thrombus in the atrial cavity or appendage. - Right atrium: No evidence of thrombus in the atrial cavity or appendage. - Atrial septum: No defect or patent foramen ovale was identified. - Tricuspid valve: Tricuspid valve in 3D showed apparent partial destruction of posterior leaflet leading to malcoaptation (See image 80) There was severe regurgitation directed eccentrically and toward the septum. Peak RV-RA gradient (S): 30 mm Hg. - Pulmonic valve: There was trivial regurgitation.  Impressions:  - Severe eccentric TR, initially appeared as malcoaptation but on 3D imaging, the posterior leaflet appears to be partially destroyed. No evidence of current vegetation. No other significant valvular abnormalities.  ------------------------------------------------------------------- Study data: Study status: Routine. Consent: The risks, benefits, and alternatives to the procedure were explained to  the patient and informed consent was obtained. Procedure: The patient reported no pain pre or post test. Initial setup. The patient was brought to the laboratory. Surface ECG leads were monitored. Sedation. Conscious sedation was administered by anesthesiology staff. Transesophageal echocardiography. An adult multiplane transesophageal probe was inserted by the attending cardiologistwithout difficulty. Image quality was adequate. Study completion: The patient tolerated the procedure well. There were no complications. Administered medications: Propofol. Diagnostic transesophageal echocardiography. 2D and color Doppler. Birthdate: Patient birthdate: 11/15/1985. Age: Patient is 33 yr old. Sex: Gender: female. BMI: 45 kg/m^2. Blood pressure: 110/74 Patient status: Outpatient. Study date: Study date: 10/03/2017. Study time: 09:25 AM. Location: Endoscopy.  -------------------------------------------------------------------  ------------------------------------------------------------------- Left ventricle: Systolic function was normal. The estimated ejection fraction was in the range of 55% to 60%. Wall motion was normal; there were no regional wall motion abnormalities.  ------------------------------------------------------------------- Aortic valve: Structurally normal valve. Trileaflet; normal thickness leaflets. Cusp separation was normal. No evidence of vegetation. Doppler: There was no regurgitation.  ------------------------------------------------------------------- Aorta: There was no atheroma. There was no evidence for dissection. Aortic root: The aortic root was not dilated. Ascending aorta: The ascending aorta was normal in size. Aortic arch: The aortic arch was normal in size.  ------------------------------------------------------------------- Mitral valve: Structurally normal valve. Leaflet separation was normal. No evidence of  vegetation. Doppler: There was trivial  regurgitation.  ------------------------------------------------------------------- Left atrium: The atrium was normal in size. No evidence of thrombus in the atrial cavity or appendage. The appendage was morphologically a left appendage, multilobulated, and of normal size. Emptying velocity was normal.  ------------------------------------------------------------------- Atrial septum: No defect or patent foramen ovale was identified.  ------------------------------------------------------------------- Right ventricle: The cavity size was normal. Wall thickness was normal. Systolic function was normal.  ------------------------------------------------------------------- Pulmonic valve: Structurally normal valve. Cusp separation was normal. No evidence of vegetation. Doppler: There was trivial regurgitation.  ------------------------------------------------------------------- Tricuspid valve: Doppler: Tricuspid valve in 3D showed apparent partial destruction of posterior leaflet leading to malcoaptation (See image 80) There was severe regurgitation directed eccentrically and toward the septum.  ------------------------------------------------------------------- Right atrium: The atrium was normal in size. No evidence of thrombus in the atrial cavity or appendage. The appendage was morphologically a right appendage.  ------------------------------------------------------------------- Pericardium: There was no pericardial effusion.  ------------------------------------------------------------------- Measurements  Tricuspid valve Value Tricuspid regurg peak velocity 274 cm/s Tricuspid peak RV-RA gradient 30 mm Hg  Legend: (L) and (H) mark values outside specified reference range.  ------------------------------------------------------------------- Prepared and  Electronically Authenticated by  Jodelle Red 2019-07-24T15:15:27    HOLTER MONITOR REPORT:    Date of test: 09/24/2017 Duration of test: 48 hours Indication: Palpitations Ordering physician: Heather Lea MD Referring physician: [name] MD   Baseline rhythm: Normal sinus rhythm  Minimum heart rate: 56 BPM. Average heart rate: 85BPM. Maximal heart rate 149 BPM.  Atrial arrhythmia: 2 premature supraventricular beats  Ventricular arrhythmia: 1 premature ventricular beat  Conduction abnormality: None  Symptoms: None   Conclusion: Normal Holter monitor.  Interpreting cardiologist: [name], MD Date: 7/26/20191:18 PM     Transthoracic Echocardiography  Patient: Heather, Day MR #: [id number] Study Date: 04/01/2018 Gender: F Age: 23 Height: 172.7 cm Weight: 142.9 kg BSA: 2.7 m^2 Pt. Status: Room:  ATTENDING Olga Millers [facility] Tressie Stalker, M.D. REFERRING Tressie Stalker, M.D. SONOGRAPHER Clearence Ped, RCS PERFORMING [facility], Outpatient  cc:  ------------------------------------------------------------------- LV EF: 50% - 55%  ------------------------------------------------------------------- Indications: Severe Tricuspid regurgitation (O07.1).  ------------------------------------------------------------------- History: PMH: history of drug abuse, history of endocarditis. Pre-op clearance. Acquired from the patient and from the patient&'s chart.  ------------------------------------------------------------------- Study Conclusions  - Left ventricle: The cavity size was normal. Wall thickness was normal. Systolic function was normal. The estimated ejection fraction was in the range of 50% to 55%. Wall motion was normal; there were no regional wall motion  abnormalities. Left ventricular diastolic function parameters were normal. - Right ventricle: The cavity size was mildly dilated. - Right atrium: The atrium was moderately dilated. - Tricuspid valve: There was severe regurgitation.  Impressions:  - Normal LV function; severe TR; moderate RAE; mild RVE.  ------------------------------------------------------------------- Study data: Comparison was made to the study of 09/24/2017. Study status: Routine. Procedure: The patient reported no pain pre or post test. Transthoracic echocardiography. Image quality was adequate. Transthoracic echocardiography. M-mode, complete 2D, spectral Doppler, and color Doppler. Birthdate: Patient birthdate: 08-May-1985. Age: Patient is 33 yr old. Sex: Gender: female. BMI: 47.9 kg/m^2. Blood pressure: 111/66 Patient status: Outpatient. Study date: Study date: 04/01/2018. Study time: 10:34 AM. Location: [place] [facility]  -------------------------------------------------------------------  ------------------------------------------------------------------- Left ventricle: The cavity size was normal. Wall thickness was normal. Systolic function was normal. The estimated ejection fraction was in the range of 50% to 55%. Wall motion was normal; there were no regional wall motion abnormalities. Left ventricular diastolic function parameters were normal.  ------------------------------------------------------------------- Aortic valve: Trileaflet; normal thickness leaflets. Mobility was  not restricted. Doppler: Transvalvular velocity was within the normal range. There was no stenosis. There was no regurgitation.  ------------------------------------------------------------------- Aorta: Aortic root: The aortic root was normal in size.  ------------------------------------------------------------------- Mitral valve: Structurally normal valve. Mobility was  not restricted. Doppler: Transvalvular velocity was within the normal range. There was no evidence for stenosis. There was no regurgitation.  ------------------------------------------------------------------- Left atrium: The atrium was normal in size.  ------------------------------------------------------------------- Right ventricle: The cavity size was mildly dilated. Systolic function was normal.  ------------------------------------------------------------------- Pulmonic valve: Doppler: Transvalvular velocity was within the normal range. There was no evidence for stenosis. There was trivial regurgitation.  ------------------------------------------------------------------- Tricuspid valve: Structurally normal valve. Doppler: Transvalvular velocity was within the normal range. There was severe regurgitation.  ------------------------------------------------------------------- Pulmonary artery: Systolic pressure was within the normal range.  ------------------------------------------------------------------- Right atrium: The atrium was moderately dilated.  ------------------------------------------------------------------- Pericardium: There was no pericardial effusion.  ------------------------------------------------------------------- Systemic veins: Inferior vena cava: The vessel was normal in size. The respirophasic diameter changes were in the normal range (= 50%), consistent with normal central venous pressure. Diameter: 19 mm.  ------------------------------------------------------------------- Measurements  IVC Value Reference ID 19 mm ----------  Left ventricle Value Reference LV ID, ED, PLAX chordal 49.8 mm 43 - 52 LV ID, ES, PLAX chordal 36.8 mm 23  - 38 LV fx shortening, PLAX chordal (L) 26 % >=29 LV PW thickness, ED 11.7 mm ---------- IVS/LV PW ratio, ED 0.68 <=1.3 Stroke volume, 2D 65 ml ---------- Stroke volume/bsa, 2D 24 ml/m^2 ---------- LV e&', lateral 13.9 cm/s ---------- LV E/e&', lateral 4.22 ---------- LV e&', medial 10.7 cm/s ---------- LV E/e&', medial 5.49 ---------- LV e&', average 12.3 cm/s ---------- LV E/e&', average 4.77 ----------  Ventricular septum Value Reference IVS thickness, ED 7.97 mm ----------  LVOT Value Reference LVOT ID, S 22 mm ---------- LVOT area 3.8 cm^2 ---------- LVOT peak velocity, S 77.6 cm/s ---------- LVOT mean velocity, S 53.8 cm/s ---------- LVOT VTI, S 17.1 cm ----------  Aorta Value Reference Aortic root ID, ED 33 mm ----------  Left atrium Value Reference LA ID, A-P, ES 42 mm ---------- LA ID/bsa, A-P 1.56 cm/m^2 <=2.2 LA volume, S 40.9 ml ---------- LA volume/bsa, S 15.2 ml/m^2 ---------- LA volume, ES, 1-p A4C 54.4 ml ---------- LA volume/bsa, ES, 1-p A4C 20.2 ml/m^2 ---------- LA volume,  ES, 1-p A2C 28.6 ml ---------- LA volume/bsa, ES, 1-p A2C 10.6 ml/m^2 ----------  Mitral valve Value Reference Mitral E-wave peak velocity 58.7 cm/s ---------- Mitral A-wave peak velocity 56.8 cm/s ---------- Mitral deceleration time (H) 296 ms 150 - 230 Mitral E/A ratio, peak 1 ----------  Pulmonary arteries Value Reference PA pressure, S, DP 28 mm Hg <=30  Tricuspid valve Value Reference Tricuspid regurg peak velocity 250 cm/s ---------- Tricuspid peak RV-RA gradient 25 mm Hg ---------- Tricuspid maximal regurg velocity, 250 cm/s ---------- PISA  Right atrium Value Reference RA ID, S-I, ES, A4C (H) 65 mm 34 - 49 RA area, ES, A4C (H) 26.4 cm^2 8.3 - 19.5 RA volume, ES, A/L 89.3 ml ---------- RA volume/bsa, ES, A/L 33.1 ml/m^2 ----------  Systemic veins Value Reference Estimated CVP 3 mm Hg ----------  Right ventricle Value Reference TAPSE 17.5 mm ---------- RV pressure, S, DP 28 mm Hg <=30 RV s&', lateral, S 14 cm/s ----------  Legend: (L) and (H) mark values outside specified reference range.  ------------------------------------------------------------------- Prepared and Electronically Authenticated by  Olga Millers 2020-01-20T13:20:05    Cardiac/Coronary CT  TECHNIQUE: The patient was scanned  on a [employer].  FINDINGS: A 120 kV prospective scan was triggered in the descending thoracic aorta at 111 HU's. Axial non-contrast 3 mm slices were carried out through the heart. The data set was analyzed on a dedicated work station and scored using the Agatson method. Gantry rotation speed was 250 msecs and collimation was .6 mm. No beta blockade and 0.8 mg of sl NTG was given. The 3D data set was reconstructed in 5% intervals of the 67-82 % of the R-R cycle. Diastolic phases were analyzed on a dedicated work station using MPR, MIP and VRT modes. The patient received 80 cc of contrast.  Limited quality scan secondary to wrong contrast timing.  Aorta: Normal size. No calcifications. No dissection.  Aortic Valve: Trileaflet. No calcifications.  Coronary Arteries: Normal coronary origin. Right dominance.  RCA is a large dominant artery that gives rise to PDA and PLVB. There is no plaque.  Left main is a large artery that gives rise to LAD and LCX arteries.  LAD is a large vessel that has no plaque.  LCX is a non-dominant artery that gives rise to one large OM1 branch. There is no plaque.  Other findings:  Normal pulmonary vein drainage into the left atrium.  Normal let atrial appendage without a thrombus.  Normal size of the pulmonary artery.  IMPRESSION: 1. Coronary calcium score of 0. This was 0 percentile for age and sex matched control.  2. Normal coronary origin with right dominance.  3. No evidence of CAD.  4. Mildly dilated pulmonary artery measuring 31 mm suggestive of pulmonary hypertension.  5. Severely dilated right atrium.   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 She claims  to have remained abstinent from all illicit drug usesince July 2018. She continues to experience symptoms of right-sided heart failure with severe swelling, volume retention, abdominal bloating, and exertional shortness of breath.   I agree that it would be reasonable to consider elective tricuspid valve repair or replacement. Based upon review of the patient'sprevousTEE I feel there is a significant chance that her valve may be repairable.She is clearly symptomatic and without some type of surgical intervention she will likely develop progressive right heart dysfunction eventually over time.The patient gives a convincing history that she has remained abstinent from any type of illicit drug use.  Recent follow-up transthoracic echocardiogram reveals normal left ventricular systolic function with mild right ventricular chamber enlargement but preserved right ventricular function.  There is severe tricuspid regurgitation with no other complicating features.  Coronary angiography reveals normal coronary artery anatomy with right dominant coronary circulation and no significant coronary artery disease.    Plan:  I have again reviewed the indications, risk, and potential benefits of tricuspid valve repair or replacement with the patient in the office today.   Alternative treatment strategies have been discussed.  Because of the patient's morbid obesity and body habitus I would not consider a candidate for minimally invasive approach for surgery. She understands that surgical intervention will require conventional median sternotomy. She understands that there remains a possibility that her valve would not be repairable in which case it would need to be replaced using a bioprosthetic tissue valve. She understands that there is some risk of developing complete heart block and possible need for permanent pacemaker placement. Expectations regarding her postoperative convalescence have been  discussed. She understands and accepts all potential risks of surgery including but not limited to risk of death, stroke or other neurologic complication, myocardial infarction, congestive heart failure, respiratory failure, renal failure, heart block or bradycardia requiring permanent pacemaker placement, bleeding requiring transfusion and/or reexploration, arrhythmia, infection or other wound complications, pneumonia, pleural and/or pericardial effusion, pulmonary embolus, aortic dissection or other major vascular complication, or delayed complications related to valve repair or replacement including but not limited to structural valve deterioration and failure, thrombosis, embolization, endocarditis, or paravalvular leak.  All of her questions have been answered.        Salvatore Decent. Cornelius Moras, MD 04/10/2018 2:07 PM

## 2018-04-18 NOTE — Anesthesia Procedure Notes (Deleted)
Arterial Line Insertion Start/End2/08/2018 7:30 AM, 04/18/2018 7:45 AM Performed by: Adair Laundry, CRNA, CRNA  Patient location: Pre-op. Preanesthetic checklist: patient identified, monitors and equipment checked and pre-op evaluation Lidocaine 1% used for infiltration Left, radial was placed Catheter size: 20 G Hand hygiene performed , maximum sterile barriers used  and Seldinger technique used  Attempts: 1 Procedure performed without using ultrasound guided technique. Following insertion, dressing applied and Biopatch. Post procedure assessment: normal  Patient tolerated the procedure well with no immediate complications.

## 2018-04-18 NOTE — Anesthesia Procedure Notes (Signed)
Central Venous Catheter Insertion Performed by: Val EagleMoser, Daylani Deblois, MD, anesthesiologist Start/End2/08/2018 7:06 AM, 04/18/2018 7:20 AM Patient location: Pre-op. Preanesthetic checklist: patient identified, IV checked, site marked, risks and benefits discussed, surgical consent, monitors and equipment checked, pre-op evaluation, timeout performed and anesthesia consent Position: supine Lidocaine 1% used for infiltration and patient sedated Hand hygiene performed  and maximum sterile barriers used  Catheter size: 8 Fr Total catheter length 16. Central line was placed.Double lumen Procedure performed using ultrasound guided technique. Ultrasound Notes:image(s) printed for medical record Attempts: 1 Following insertion, dressing applied, line sutured and Biopatch. Post procedure assessment: blood return through all ports, free fluid flow and no air  Patient tolerated the procedure well with no immediate complications.

## 2018-04-18 NOTE — Anesthesia Procedure Notes (Signed)
Arterial Line Insertion Start/End2/08/2018 7:40 AM, 04/18/2018 7:42 AM Performed by: Adair Laundry, CRNA, CRNA  Patient location: Pre-op. Preanesthetic checklist: patient identified, IV checked, site marked, risks and benefits discussed, surgical consent, monitors and equipment checked, pre-op evaluation, timeout performed and anesthesia consent Lidocaine 1% used for infiltration Left, radial was placed Catheter size: 20 Fr Hand hygiene performed  and maximum sterile barriers used   Attempts: 1 Procedure performed without using ultrasound guided technique. Following insertion, dressing applied. Post procedure assessment: normal and unchanged

## 2018-04-18 NOTE — OR Nursing (Signed)
13:20 - 20 minute call to SICU charge nurse 

## 2018-04-18 NOTE — Progress Notes (Signed)
  Echocardiogram Echocardiogram Transesophageal has been performed.  Heather Day M 04/18/2018, 8:58 AM 

## 2018-04-18 NOTE — Transfer of Care (Signed)
Immediate Anesthesia Transfer of Care Note  Patient: Heather Day  Procedure(s) Performed: TRICUSPID VALVE REPAIR (N/A Chest) TRANSESOPHAGEAL ECHOCARDIOGRAM (TEE) (N/A )  Patient Location: SICU  Anesthesia Type:General  Level of Consciousness: awake and patient cooperative  Airway & Oxygen Therapy: Patient Spontanous Breathing and Patient connected to face mask oxygen  Post-op Assessment: Report given to RN and Post -op Vital signs reviewed and stable  Post vital signs: Reviewed and stable  Last Vitals:  Vitals Value Taken Time  BP 112/72 04/18/2018  2:00 PM  Temp    Pulse 64 04/18/2018  2:01 PM  Resp 18 04/18/2018  2:01 PM  SpO2 98 % 04/18/2018  2:01 PM  Vitals shown include unvalidated device data.  Last Pain:  Vitals:   04/18/18 0609  TempSrc:   PainSc: 0-No pain      Patients Stated Pain Goal: 4 (04/18/18 1610)  Complications: No apparent anesthesia complications

## 2018-04-18 NOTE — Progress Notes (Signed)
Patient ID: Heather Day, female   DOB: 1985-08-26, 33 y.o.   MRN: [id number] EVENING ROUNDS NOTE :     [address]       [place] [address]             [phone]                 Day of Surgery Procedure(s) (LRB): TRICUSPID VALVE REPAIR (N/A) TRANSESOPHAGEAL ECHOCARDIOGRAM (TEE) (N/A)  Total Length of Stay:  LOS: 0 days  BP 121/65   Pulse 77   Temp 99.1 F (37.3 C)   Resp (!) 5   Ht 5\' 8"  (1.727 m)   Wt 113.4 kg   LMP 04/04/2018 (Approximate)   SpO2 92%   BMI 38.01 kg/m   .Intake/Output      02/06 0701 - 02/07 0700   I.V. (mL/kg) 2900.1 (25.6)   Blood 310   IV Piggyback 589.5   Total Intake(mL/kg) 3799.5 (33.5)   Urine (mL/kg/hr) 1065 (0.7)   Blood 550   Chest Tube 120   Total Output 1735   Net +2064.5         . sodium chloride    . sodium chloride 100 mL/hr at 04/18/18 1900  . [START ON 04/19/2018] sodium chloride    . sodium chloride    . albumin human    . cefUROXime (ZINACEF)  IV    . dexmedetomidine (PRECEDEX) IV infusion Stopped (04/18/18 1518)  . famotidine (PEPCID) IV 20 mg (04/18/18 1438)  . insulin 1.8 mL/hr at 04/18/18 1900  . lactated ringers    . lactated ringers    . lactated ringers 10 mL/hr at 04/18/18 1900  . nitroGLYCERIN Stopped (04/18/18 1351)  . phenylephrine (NEO-SYNEPHRINE) Adult infusion Stopped (04/18/18 1628)  . vancomycin       Lab Results  Component Value Date   WBC 13.6 (H) 04/18/2018   HGB 10.2 (L) 04/18/2018   HCT 30.0 (L) 04/18/2018   PLT 193 04/18/2018   GLUCOSE 114 (H) 04/18/2018   CHOL 214 (H) 02/26/2017   TRIG 184 (H) 02/26/2017   HDL 45 02/26/2017   LDLCALC 132 (H) 02/26/2017   ALT 16 04/15/2018   AST 21 04/15/2018   NA 141 04/18/2018   K 4.1 04/18/2018   CL 105 04/15/2018   CREATININE 0.89 04/15/2018   BUN 15 04/15/2018   CO2 21 (L) 04/15/2018   INR 1.35 04/18/2018   HGBA1C 5.8 (H) 04/15/2018    Extubated, awake and alert, not bleeding   Delight Ovens MD  Beeper  [phone] Office [phone] 04/18/2018 7:45 PM

## 2018-04-18 NOTE — Interval H&P Note (Signed)
History and Physical Interval Note:  04/18/2018 5:40 AM  Heather Day  has presented today for surgery, with the diagnosis of TR  The various methods of treatment have been discussed with the patient and family. After consideration of risks, benefits and other options for treatment, the patient has consented to  Procedure(s): TRICUSPID VALVE REPAIR OR REPLACEMENT (N/A) TRANSESOPHAGEAL ECHOCARDIOGRAM (TEE) (N/A) as a surgical intervention .  The patient's history has been reviewed, patient examined, no change in status, stable for surgery.  I have reviewed the patient's chart and labs.  Questions were answered to the patient's satisfaction.     Purcell Nails

## 2018-04-18 NOTE — Anesthesia Postprocedure Evaluation (Signed)
Anesthesia Post Note  Patient: [name]  Procedure(s) Performed: TRICUSPID VALVE REPAIR (N/A Chest) TRANSESOPHAGEAL ECHOCARDIOGRAM (TEE) (N/A )     Patient location during evaluation: ICU Anesthesia Type: General Level of consciousness: awake Pain management: pain level controlled Vital Signs Assessment: post-procedure vital signs reviewed and stable Respiratory status: spontaneous breathing, nonlabored ventilation, respiratory function stable and patient connected to nasal cannula oxygen Cardiovascular status: blood pressure returned to baseline and stable Postop Assessment: no apparent nausea or vomiting Anesthetic complications: no    Last Vitals:  Vitals:   04/18/18 1615 04/18/18 1630  BP:    Pulse: 61 69  Resp: 16 15  Temp: 37.4 C 37.5 C  SpO2: 96% 94%    Last Pain:  Vitals:   04/18/18 1608  TempSrc:   PainSc: 10-Worst pain ever                 Ryan P Ellender

## 2018-04-18 NOTE — Op Note (Signed)
CARDIOTHORACIC SURGERY OPERATIVE NOTE  Date of Procedure:  04/18/2018  Preoperative Diagnosis: Severe Tricuspid Regurgitation  Postoperative Diagnosis: Same  Procedure:    Tricuspid Valve Repair  Complex valvuloplasty including autologous pericardial patch augmentation of anterior and posterior leaflets  Artificial Gore-tex neochord placement x4  Edwards mc3 ring annuloplasty (size 30 mm, model #4900, serial #[id number])  Surgeon: Salvatore Decentlarence H. Cornelius Moraswen, MD  Assistant: Ardelle Ballsonielle M. 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  The patient states that she was a heroin addict for approximately 10 years. Approximately 3 years ago she was hospitalized with right-sided bacterial endocarditis complicated by severe tricuspid regurgitation and septic embolization to the lung. She states that she was actually scheduled for tricuspid valve replacement but she left the hospital AMA. At the time she lived in [place]. She was subsequently incarcerated in prison for a little over a year. When she got out of prison she initially went back to using drugs but within 3 weeks made a decision to clean up her life. She became completely abstinent and moved to [place] to live with her cousin. She claims to have remained drug-free ever  since, now approximately 15 months. She developed progressive symptoms of atypical chest pain, shortness of breath, and severe generalized edema. She was initially evaluated by Dr. Bing MatterKrasowski in December 2018, and she has been followed carefully ever since. In April 2019 she developed acute appendicitis and underwent laparoscopic appendectomy by Dr. Daphine DeutscherMartin. CT scan performed at that time revealed cholelithiasis. She recovered uneventfully.In May she was evaluated in the emergency room and diagnosed with biliary colic. In June she was seen in follow-up by Dr. Bing MatterKrasowski and notably complaining of worsened abdominal swelling and swelling in both arms and legs. She has had atypical chest pain and palpitations. Her weight had been trending up. She underwent a Holter monitor that revealed only sinus rhythm with no suggestion of any sustained arrhythmias. Follow-up echocardiogram revealed normal left ventricular systolic function with moderate right atrial enlargement and moderate to severe tricuspid regurgitation. The tricuspid regurgitation reportedly looked worse than previous echocardiograms and there was question of a possible flail leaflet of the tricuspid valve. TEE was performed October 03, 2017 and confirmed the presence of severe tricuspid regurgitation. There was normal left ventricular systolic function with ejection fraction estimated 55 to 60%. The aortic and mitral valves were normal. The patient was subsequently referred for elective surgical consultation.  The patient has been seen in consultation and counseled at length regarding the indications, risks and potential benefits of surgery.  All questions have been answered, and the patient provides full informed consent for the operation as described.    DETAILS OF THE OPERATIVE PROCEDURE  Preparation:  The patient is brought to the operating room on the above mentioned date and central monitoring was established by the anesthesia team  including placement of right internal jugular central venous  catheter and radial arterial line. The patient is placed in the supine position on the operating table.  Intravenous antibiotics are administered. General endotracheal anesthesia is induced uneventfully. A Foley catheter is placed.  Baseline transesophageal echocardiogram was performed.  Findings were notable for severe tricuspid regurgitation.  The tricuspid annulus was dilated.  The jet of regurgitation appeared to be related to a large perforation in the posterior leaflet.  The right ventricle was dilated.  There was normal right ventricular function.  Left ventricular size and function was normal.  No other abnormalities were noted.  The patient's chest, abdomen, both groins, and both lower extremities are prepared and draped in a sterile manner. A time out procedure is performed.   Surgical Approach:  A median sternotomy incision was performed and the pericardium is opened. The ascending aorta is normal in appearance.  A generous portion of the patient's pericardium is excised and subsequently soaked in 0.625% glutaraldehyde solution for a total of 3 minutes.  The pericardial patch is then rinsed and consecutive baths of saline per protocol.   Percutaneous Vascular Access:  Percutaneous venous access were obtained on the right side.  Using ultrasound guidance the common femoral vein was cannulated using the Seldinger technique and single Perclose vascular closure devise was placed, after which time an 8 French sheath inserted.  The right internal jugular vein was cannulated  using ultrasound guidance and an 8 French sheath inserted.   The patient was heparinized systemically.   Extracorporeal Cardiopulmonary Bypass and Myocardial Protection:  The ascending aorta is cannulated using a 7 mm straight arterial cannula.  The right common femoral vein is cannulated through the venous sheath and a guidewire advanced into the right atrium  using TEE guidance.  The femoral vein cannulated using a 22 Fr long femoral venous cannula.  The right internal jugular vein is cannulated through the venous sheath and a guidewire advanced into the right atrium.  The internal jugular vein is cannulated using a 14 French pediatric femoral venous cannula.   Adequate heparinization is verified.    The entire pre-bypass portion of the operation was notable for stable hemodynamics.  Cardiopulmonary bypass was begun and the surface of the heart is inspected.  Umbilical tapes were placed around the superior vena cava and the inferior vena cava.  A cardioplegia cannula is placed in the ascending aorta.    The patient is cooled to 32C systemic temperature.  The aortic cross clamp is applied and cardioplegia is delivered in an antegrade fashion through the aortic root using modified [place] cold blood cardioplegia (Kennestone blood cardioplegia protocol).   The initial cardioplegic arrest is rapid with early diastolic arrest.    Myocardial protection was felt to be excellent.   Tricuspid Valve Repair:  An oblique right atriotomy incision was performed.  The inferior vena cava cannula is pulled down until the tip is just outside the right atrium and both umbilical tapes were snared.  Traction sutures are utilized to expose the tricuspid valve.  The tricuspid valve is carefully examined.  There is no sign of active infection.  The majority of the posterior leaflet of the tricuspid valve is destroyed.  All of the chordal support and a good portion of the leaflet is missing.  The posterior leaflet prolapse is severely.  The anterior and septal leaflets both appear essentially normal.  Valve repair is felt feasible.  Interrupted 2-0 Ethibond horizontal mattress sutures are placed circumferentially around the tricuspid annulus with the exception of the area  immediately anterior to the triangle of Koch.  The sutures will ultimately be utilized for ring annuloplasty  and at this juncture they are utilized to suspend the valve symmetrically.  The cleft between the posterior and anterior leaflets are closed using a series of interrupted simple everting CV 5 Gore-Tex suture.  The entire anterior and posterior leaflet complex is then mobilized off of the entire tricuspid annulus using an 11 blade knife.  Patch augmentation of the tricuspid valve leaflets is performed using a generous elliptical patch of autologous pericardium.  The patch is sewn in place using a running 5-0 Prolene suture.  After completion of patch augmentation there appears to be some overriding her mild prolapse along the free margin of the anterior leaflet.  Artificial neochord placement was performed using Chord-X multi-strand CV-4 Goretex pre-measured loops.  The appropriate cord length was measured from corresponding normal length primary cords from the anterior segment of the anterior leaflet. The papillary muscle suture of the Chord-X multi-strand suture was placed through the head of the anterior papillary muscle in a horizontal mattress fashion and tied over Teflon felt pledgets. Two of the three pre-measured loops were then reimplanted into the free margin of the anterior leaflet.    The valve was tested with saline and appeared reasonably competent even without ring annuloplasty complete. The valve was sized to a 30 mm annuloplasty ring, based upon the total combined surface area of the anterior and posterior leaflets.  An Edwards mc3 annuloplasty ring (size 30mm, model #4900, serial [id number]) was secured in place uneventfully. All sutures were secured using a Cor-knot device.  The valve was tested with saline and appeared competent.  Rewarming is begun.   Procedure Completion:  One final dose of warm retrograde "reanimation dose" cardioplegia was administered retrograde through the coronary sinus catheter while all air was evacuated through the aortic root.  The aortic cross clamp was  removed after a total cross clamp time of 88 minutes.  The right atriotomy was closed using a 2-layer closure of running 4-0 Prolene suture.   Epicardial pacing wires are fixed to the right ventricular outflow tract and to the right atrial appendage. The patient is rewarmed to 37C temperature. The aortic vent is removed.  The patient is weaned and disconnected from cardiopulmonary bypass.  The patient's rhythm at separation from bypass was sinus.  The patient was weaned from cardiopulmonary bypass without any inotropic support. Total cardiopulmonary bypass time for the operation was 115 minutes.  Followup transesophageal echocardiogram performed after separation from bypass revealed a well-seated tricuspid annuloplasty ring and a tricuspid valve that was functioning normally with mild residual mitral regurgitation.  Left and right ventricular function were unchanged from preoperatively.  The aortic cannula was removed uneventfully. Protamine was administered to reverse the anticoagulation. The femoral venous cannula was removed and the Perclose suture secured.  The right IJ cannula was removed.  Manual pressure held on the groin and right neck for 30 minutes.  The mediastinum was inspected for hemostasis and irrigated with saline solution. The mediastinum was drained using 2 chest tubes placed through separate stab incisions inferiorly.  The soft tissues anterior to the aorta were reapproximated loosely. The sternum is closed with double strength sternal wire. The soft tissues anterior to the sternum were closed in multiple layers and the skin is closed with a running subcuticular skin closure.   The post-bypass portion of the operation was notable for stable rhythm and hemodynamics.   No blood products were administered during  the operation.   Disposition:  The patient tolerated the procedure well.  The patient was extubated in the operating room and transported to the surgical intensive care unit  in stable condition. There were no intraoperative complications. All sponge instrument and needle counts are verified correct at completion of the operation.    Salvatore Decent. Cornelius Moras MD 04/18/2018 1:18 PM

## 2018-04-19 ENCOUNTER — Inpatient Hospital Stay (HOSPITAL_COMMUNITY): Payer: Self-pay

## 2018-04-19 ENCOUNTER — Encounter (HOSPITAL_COMMUNITY): Payer: Self-pay | Admitting: Thoracic Surgery (Cardiothoracic Vascular Surgery)

## 2018-04-19 LAB — BASIC METABOLIC PANEL
ANION GAP: 10 (ref 5–15)
Anion gap: 8 (ref 5–15)
BUN: 7 mg/dL (ref 6–20)
BUN: 7 mg/dL (ref 6–20)
CO2: 21 mmol/L — ABNORMAL LOW (ref 22–32)
CO2: 25 mmol/L (ref 22–32)
Calcium: 8.2 mg/dL — ABNORMAL LOW (ref 8.9–10.3)
Calcium: 8.3 mg/dL — ABNORMAL LOW (ref 8.9–10.3)
Chloride: 106 mmol/L (ref 98–111)
Chloride: 104 mmol/L (ref 98–111)
Creatinine, Ser: 0.7 mg/dL (ref 0.44–1.00)
Creatinine, Ser: 0.81 mg/dL (ref 0.44–1.00)
GFR calc Af Amer: >60 mL/min (ref >60)
GFR calc Af Amer: >60 mL/min (ref >60)
GFR calc non Af Amer: >60 mL/min (ref >60)
Glucose, Bld: 129 mg/dL — ABNORMAL HIGH (ref 70–99)
Glucose, Bld: 129 mg/dL — ABNORMAL HIGH (ref 70–99)
Potassium: 4 mmol/L (ref 3.5–5.1)
Potassium: 3.8 mmol/L (ref 3.5–5.1)
Sodium: 137 mmol/L (ref 135–145)
Sodium: 137 mmol/L (ref 135–145)

## 2018-04-19 LAB — CBC
HCT: 33.1 % — ABNORMAL LOW (ref 36.0–46.0)
HEMATOCRIT: 33 % — AB (ref 36.0–46.0)
HEMOGLOBIN: 10.9 g/dL — AB (ref 12.0–15.0)
Hemoglobin: 10.8 g/dL — ABNORMAL LOW (ref 12.0–15.0)
MCH: 29.5 pg (ref 26.0–34.0)
MCH: 29.6 pg (ref 26.0–34.0)
MCHC: 33 g/dL (ref 30.0–36.0)
MCHC: 32.6 g/dL (ref 30.0–36.0)
MCV: 89.4 fL (ref 80.0–100.0)
MCV: 90.7 fL (ref 80.0–100.0)
Platelets: 195 10*3/uL (ref 150–400)
Platelets: 191 10*3/uL (ref 150–400)
RBC: 3.69 MIL/uL — ABNORMAL LOW (ref 3.87–5.11)
RBC: 3.65 MIL/uL — ABNORMAL LOW (ref 3.87–5.11)
RDW: 12.5 % (ref 11.5–15.5)
RDW: 12.6 % (ref 11.5–15.5)
WBC: 15.2 10*3/uL — ABNORMAL HIGH (ref 4.0–10.5)
WBC: 13.8 10*3/uL — ABNORMAL HIGH (ref 4.0–10.5)
nRBC: 0 % (ref 0.0–0.2)
nRBC: 0 % (ref 0.0–0.2)

## 2018-04-19 LAB — GLUCOSE, CAPILLARY
GLUCOSE-CAPILLARY: 107 mg/dL — AB (ref 70–99)
Glucose-Capillary: 110 mg/dL — ABNORMAL HIGH (ref 70–99)
Glucose-Capillary: 116 mg/dL — ABNORMAL HIGH (ref 70–99)
Glucose-Capillary: 119 mg/dL — ABNORMAL HIGH (ref 70–99)
Glucose-Capillary: 119 mg/dL — ABNORMAL HIGH (ref 70–99)
Glucose-Capillary: 127 mg/dL — ABNORMAL HIGH (ref 70–99)
Glucose-Capillary: 127 mg/dL — ABNORMAL HIGH (ref 70–99)
Glucose-Capillary: 128 mg/dL — ABNORMAL HIGH (ref 70–99)
Glucose-Capillary: 135 mg/dL — ABNORMAL HIGH (ref 70–99)
Glucose-Capillary: 137 mg/dL — ABNORMAL HIGH (ref 70–99)
Glucose-Capillary: 138 mg/dL — ABNORMAL HIGH (ref 70–99)
Glucose-Capillary: 159 mg/dL — ABNORMAL HIGH (ref 70–99)
Glucose-Capillary: 167 mg/dL — ABNORMAL HIGH (ref 70–99)
Glucose-Capillary: 191 mg/dL — ABNORMAL HIGH (ref 70–99)

## 2018-04-19 LAB — MAGNESIUM
Magnesium: 2.1 mg/dL (ref 1.7–2.4)
Magnesium: 2 mg/dL (ref 1.7–2.4)

## 2018-04-19 MED ORDER — KETOROLAC TROMETHAMINE 15 MG/ML IJ SOLN
15.0000 mg | Freq: Four times a day (QID) | INTRAMUSCULAR | Status: AC
  Administered 2018-04-19 – 2018-04-20 (×5): 15 mg via INTRAVENOUS
  Filled 2018-04-19 (×5): qty 1

## 2018-04-19 MED ORDER — FUROSEMIDE 10 MG/ML IJ SOLN
40.0000 mg | Freq: Two times a day (BID) | INTRAMUSCULAR | Status: AC
  Administered 2018-04-19 (×2): 40 mg via INTRAVENOUS
  Filled 2018-04-19: qty 4

## 2018-04-19 MED ORDER — ENOXAPARIN SODIUM 40 MG/0.4ML ~~LOC~~ SOLN
40.0000 mg | Freq: Every day | SUBCUTANEOUS | Status: DC
  Administered 2018-04-20 – 2018-04-22 (×3): 40 mg via SUBCUTANEOUS
  Filled 2018-04-19 (×3): qty 0.4

## 2018-04-19 MED ORDER — ENOXAPARIN SODIUM 40 MG/0.4ML ~~LOC~~ SOLN: 40.0000 mg | Freq: Every day | SUBCUTANEOUS | Status: DC

## 2018-04-19 NOTE — Progress Notes (Signed)
RT instructed pt on the use of flutter valve.  Pt able to demonstrate back good technique, but with a weak effort.

## 2018-04-19 NOTE — Progress Notes (Addendum)
[facility] DAILY ICU PROGRESS NOTE                   [address]            [place] [id number]          [id number]   1 Day Post-Op Procedure(s) (LRB): TRICUSPID VALVE REPAIR (N/A) TRANSESOPHAGEAL ECHOCARDIOGRAM (TEE) (N/A)  Total Length of Stay:  LOS: 1 day   Subjective: Patient with a lot of incisional pain this am. She is also asking when she will receive her Methadone.  Objective: Vital signs in last 24 hours: Temp:  [99 F (37.2 C)-99.5 F (37.5 C)] 99.1 F (37.3 C) (02/07 0630) Pulse Rate:  [58-81] 74 (02/07 0705) Cardiac Rhythm: Normal sinus rhythm;Bundle branch block (02/06 2000) Resp:  [5-23] 18 (02/07 0705) BP: (100-129)/(62-77) 120/70 (02/07 0700) SpO2:  [86 %-98 %] 90 % (02/07 0705) Arterial Line BP: (96-138)/(52-74) 136/73 (02/07 0705) Weight:  [150 kg] 150 kg (02/07 0655)  Filed Weights   04/18/18 0552 04/19/18 0655  Weight: 113.4 kg (!) 150 kg    Weight change: 36.6 kg      Intake/Output from previous day: 02/06 0701 - 02/07 0700 In: 4971.7 [P.O.:240; I.V.:3444.2; Blood:310; IV Piggyback:977.5] Out: 2660 [Urine:1710; Blood:550; Chest Tube:400]  Intake/Output this shift: No intake/output data recorded.  Current Meds: Scheduled Meds: . acetaminophen  1,000 mg Oral Q6H  . aspirin EC  325 mg Oral Daily  . bisacodyl  10 mg Oral Daily   Or  . bisacodyl  10 mg Rectal Daily  . docusate sodium  200 mg Oral Daily  . insulin regular  0-10 Units Intravenous TID WC  . methadone  85 mg Oral Daily  . [START ON 04/20/2018] pantoprazole  40 mg Oral Daily  . sodium chloride flush  3 mL Intravenous Q12H   Continuous Infusions: . sodium chloride    . albumin human    . cefUROXime (ZINACEF)  IV 200 mL/hr at 04/19/18 0700  . insulin 3.1 mL/hr at 04/19/18 0700  . lactated ringers     PRN Meds:.albumin human, metoprolol tartrate, morphine injection, ondansetron (ZOFRAN) IV, oxyCODONE, sodium chloride flush, traMADol  General appearance: alert,  cooperative and no distress Neurologic: intact Heart: RRR Lungs: Diminshed at bases Abdomen: Soft, obese, non tender, sporadic bowel sounds Extremities: Bilateral LE edema Wound: Aquacel intact  Lab Results: CBC: Recent Labs    04/18/18 2004 04/19/18 0637  WBC 9.8 15.2*  HGB 11.3* 10.9*  HCT 35.5* 33.0*  PLT 176 195   BMET:  Recent Labs    04/18/18 1359 04/18/18 1455 04/18/18 2004  NA 143 141 139  K 4.2 4.1 4.1  CL  --   --  110  CO2  --   --  23  GLUCOSE 114*  --  139*  BUN  --   --  9  CREATININE  --   --  0.77  CALCIUM  --   --  7.8*    CMET: Lab Results  Component Value Date   WBC 15.2 (H) 04/19/2018   HGB 10.9 (L) 04/19/2018   HCT 33.0 (L) 04/19/2018   PLT 195 04/19/2018   GLUCOSE 139 (H) 04/18/2018   CHOL 214 (H) 02/26/2017   TRIG 184 (H) 02/26/2017   HDL 45 02/26/2017   LDLCALC 132 (H) 02/26/2017   ALT 16 04/15/2018   AST 21 04/15/2018   NA 139 04/18/2018   K 4.1 04/18/2018   CL 110 04/18/2018  CREATININE 0.77 04/18/2018   BUN 9 04/18/2018   CO2 23 04/18/2018   INR 1.35 04/18/2018   HGBA1C 5.8 (H) 04/15/2018      PT/INR:  Recent Labs    04/18/18 1400  LABPROT 16.6*  INR 1.35   Radiology: Dg Chest Port 1 View  Result Date: 04/18/2018 CLINICAL DATA:  Atelectasis ; post op tricuspid valve repair EXAM: PORTABLE CHEST 1 VIEW COMPARISON:  04/15/2018 FINDINGS: Since the prior exam, cardiac surgery has been performed. The cardiac silhouette is normal in size and configuration. No mediastinal widening. Lung volumes are low. There is mild atelectasis at the bases. No evidence of pulmonary edema. No pneumothorax. There is a mediastinal tube and a right internal jugular central venous catheter, the latter with its tip in the mid superior vena cava. IMPRESSION: 1. Status post cardiac surgery with tricuspid valve repair. 2. No evidence of an operative complication. No mediastinal widening, pulmonary edema or pneumothorax. 3. Mild basilar atelectasis.  Electronically Signed   By: Amie Portlandavid  Ormond M.D.   On: 04/18/2018 14:15    Assessment/Plan: S/P Procedure(s) (LRB): TRICUSPID VALVE REPAIR (N/A) TRANSESOPHAGEAL ECHOCARDIOGRAM (TEE) (N/A)  1. CV-SR. Likely start low dose Lopressor 2. Pulmonary-on 15 L HFNC. Chest tubes with 400 cc of output since surgery. CXR this am appears to show patient rotated, low lung volumes, bibasilar atelectasis. Encourage incentive spirometer and flutter valve 3. Volume overload-will give Lasix 40 mg IV this am 4. Expected ABL anemia-H and H 10.9 and 33 this am 5. CBGs 127/110/138. Wean off Insulin drip. Pre op HGA1C 5.8. She likely has pre diabetes and will need further follow up with medical doctor after discharge 6. History of drug abuse-on Methadone 7. Regarding pain control, will discuss with Dr. Cornelius Moraswen if could give a few doses of Toradol 8. Please see progression orders-put on Lovenox for DVT prophylaxis  Donielle Margaretann LovelessM Zimmerman PA-C 04/19/2018 7:22 AM   I have seen and examined the patient and agree with the assessment and plan as outlined.  Overall doing well.  Increased O2 requirement likely primarily due to atelectasis.  Add Toradol.  Mobilize.  D/C tubes later today or tomorrow, depending on output.    Purcell Nailslarence H , MD 04/19/2018 8:37 AM

## 2018-04-19 NOTE — Discharge Instructions (Signed)
Discharge Instructions:  1. You may shower, please wash incisions daily with soap and water and keep dry.  If you wish to cover wounds with dressing you may do so but please keep clean and change daily.  No tub baths or swimming until incisions have completely healed.  If your incisions become red or develop any drainage please call our office at [phone]  2. No Driving until cleared by Dr. Orvan July office and you are no longer using narcotic pain medications  3. Monitor your weight daily.. Please use the same scale and weigh at same time... If you gain 5-10 lbs in 48 hours with associated lower extremity swelling, please contact our office at [phone]  4. Fever of 101.5 for at least 24 hours with no source, please contact our office at [phone]  5. Activity- up as tolerated, please walk at least 3 times per day.  Avoid strenuous activity, no lifting, pushing, or pulling with your arms over 8-10 lbs for a minimum of 6 weeks  6. If any questions or concerns arise, please do not hesitate to contact our office at [phone]   Prediabetes Eating Plan Prediabetes is a condition that causes blood sugar (glucose) levels to be higher than normal. This increases the risk for developing diabetes. In order to prevent diabetes from developing, your health care provider may recommend a diet and other lifestyle changes to help you:  Control your blood glucose levels.  Improve your cholesterol levels.  Manage your blood pressure. Your health care provider may recommend working with a diet and nutrition specialist (dietitian) to make a meal plan that is best for you. What are tips for following this plan? Lifestyle  Set weight loss goals with the help of your health care team. It is recommended that most people with prediabetes lose 7% of their current body weight.  Exercise for at least 30 minutes at least 5 days a week.  Attend a support group or seek ongoing support from a mental health  counselor.  Take over-the-counter and prescription medicines only as told by your health care provider. Reading food labels  Read food labels to check the amount of fat, salt (sodium), and sugar in prepackaged foods. Avoid foods that have: ? Saturated fats. ? Trans fats. ? Added sugars.  Avoid foods that have more than 300 milligrams (mg) of sodium per serving. Limit your daily sodium intake to less than 2,300 mg each day. Shopping  Avoid buying pre-made and processed foods. Cooking  Cook with olive oil. Do not use butter, lard, or ghee.  Bake, broil, grill, or boil foods. Avoid frying. Meal planning   Work with your dietitian to develop an eating plan that is right for you. This may include: ? Tracking how many calories you take in. Use a food diary, notebook, or mobile application to track what you eat at each meal. ? Using the glycemic index (GI) to plan your meals. The index tells you how quickly a food will raise your blood glucose. Choose low-GI foods. These foods take a longer time to raise blood glucose.  Consider following a Mediterranean diet. This diet includes: ? Several servings each day of fresh fruits and vegetables. ? Eating fish at least twice a week. ? Several servings each day of whole grains, beans, nuts, and seeds. ? Using olive oil instead of other fats. ? Moderate alcohol consumption. ? Eating small amounts of red meat and whole-fat dairy.  If you have high blood pressure, you may need to  limit your sodium intake or follow a diet such as the DASH eating plan. DASH is an eating plan that aims to lower high blood pressure. What foods are recommended? The items listed below may not be a complete list. Talk with your dietitian about what dietary choices are best for you. Grains Whole grains, such as whole-wheat or whole-grain breads, crackers, cereals, and pasta. Unsweetened oatmeal. Bulgur. Barley. Quinoa. Brown rice. Corn or whole-wheat flour tortillas or  taco shells. Vegetables Lettuce. Spinach. Peas. Beets. Cauliflower. Cabbage. Broccoli. Carrots. Tomatoes. Squash. Eggplant. Herbs. Peppers. Onions. Cucumbers. Brussels sprouts. Fruits Berries. Bananas. Apples. Oranges. Grapes. Papaya. Mango. Pomegranate. Kiwi. Grapefruit. Cherries. Meats and other protein foods Seafood. Poultry without skin. Lean cuts of pork and beef. Tofu. Eggs. Nuts. Beans. Dairy Low-fat or fat-free dairy products, such as yogurt, cottage cheese, and cheese. Beverages Water. Tea. Coffee. Sugar-free or diet soda. Seltzer water. Lowfat or no-fat milk. Milk alternatives, such as soy or almond milk. Fats and oils Olive oil. Canola oil. Sunflower oil. Grapeseed oil. Avocado. Walnuts. Sweets and desserts Sugar-free or low-fat pudding. Sugar-free or low-fat ice cream and other frozen treats. Seasoning and other foods Herbs. Sodium-free spices. Mustard. Relish. Low-fat, low-sugar ketchup. Low-fat, low-sugar barbecue sauce. Low-fat or fat-free mayonnaise. What foods are not recommended? The items listed below may not be a complete list. Talk with your dietitian about what dietary choices are best for you. Grains Refined white flour and flour products, such as bread, pasta, snack foods, and cereals. Vegetables Canned vegetables. Frozen vegetables with butter or cream sauce. Fruits Fruits canned with syrup. Meats and other protein foods Fatty cuts of meat. Poultry with skin. Breaded or fried meat. Processed meats. Dairy Full-fat yogurt, cheese, or milk. Beverages Sweetened drinks, such as sweet iced tea and soda. Fats and oils Butter. Lard. Ghee. Sweets and desserts Baked goods, such as cake, cupcakes, pastries, cookies, and cheesecake. Seasoning and other foods Spice mixes with added salt. Ketchup. Barbecue sauce. Mayonnaise. Summary  To prevent diabetes from developing, you may need to make diet and other lifestyle changes to help control blood sugar, improve  cholesterol levels, and manage your blood pressure.  Set weight loss goals with the help of your health care team. It is recommended that most people with prediabetes lose 7 percent of their current body weight.  Consider following a Mediterranean diet that includes plenty of fresh fruits and vegetables, whole grains, beans, nuts, seeds, fish, lean meat, low-fat dairy, and healthy oils. This information is not intended to replace advice given to you by your health care provider. Make sure you discuss any questions you have with your health care provider. Document Released: 07/14/2014 Document Revised: 05/03/2016 Document Reviewed: 05/03/2016 Elsevier Interactive Patient Education  2019 [employer].

## 2018-04-19 NOTE — Progress Notes (Signed)
      [address]       [place] [id number]             [phone]      Resting comfortably at present  BP 111/64   Pulse 68   Temp (P) 98.3 F (36.8 C) (Oral)   Resp 19   Ht 5\' 8"  (1.727 m)   Wt (!) 150 kg   LMP 04/04/2018 (Approximate)   SpO2 94%   BMI 50.28 kg/m   HFNC 9L92% sat  Intake/Output Summary (Last 24 hours) at 04/19/2018 1659 Last data filed at 04/19/2018 1600 Gross per 24 hour  Intake 2244.51 ml  Output 2425 ml  Net -180.49 ml   Continue current care Aggressive pulmonary toilet  Viviann Spare C. Dorris Fetch, MD [facility] [phone]

## 2018-04-19 NOTE — Plan of Care (Signed)

## 2018-04-19 NOTE — Discharge Summary (Addendum)
Physician Discharge Summary       [address]       [place], [place] [address]             [phone]    Patient ID: Heather Day MRN: [id number] DOB/AGE: 1985/12/05 33 y.o.  Admit date: 04/18/2018 Discharge date: 04/23/2018  Admission Diagnoses: Severe tricuspid regurgitation  Discharge Diagnoses:  1. S/P tricuspid valve repair 2. ABL anemia 3. History of IV drug abuse (HCC) 4. History of bipolar disorder (HCC) 5. History of anxiety 6. History of Hepatitis C 7. History of PTSD (post-traumatic stress disorder) 8. History of GERD (gastroesophageal reflux disease) 9. 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  The patient states that she was a heroin addict for approximately 10 years. Approximately 3 years ago she was hospitalized with right-sided bacterial endocarditis complicated by severe tricuspid regurgitation and septic embolization to the lung. She states that she was actually scheduled for tricuspid valve replacement but she left the hospital AMA. At the time she lived in [place]. She was subsequently incarcerated in prison for a little over a year. When she got out of prison she initially went back to using drugs but within 3 weeks made a decision to clean up her life. She became completely abstinent and moved to [place]  to live with her cousin. She claims to have remained drug-free ever since, now approximately 15 months. She developed progressive symptoms of atypical chest pain, shortness of breath, and severe generalized edema. She was initially evaluated by Dr. Bing Matter in December 2018, and she has been followed carefully ever since. In April 2019 she developed acute appendicitis and underwent laparoscopic appendectomy by Dr. [name]. CT scan performed at that time revealed cholelithiasis. She recovered uneventfully.In May she was evaluated in the emergency room and diagnosed with biliary colic. In June she was seen in follow-up by Dr. Bing Matter and notably complaining of worsened abdominal swelling and swelling in both arms and legs. She has had atypical chest pain and palpitations. Her weight had been trending up. She underwent a Holter monitor that revealed only sinus rhythm with no suggestion of any sustained arrhythmias. Follow-up echocardiogram revealed normal left ventricular systolic function with moderate right atrial enlargement and moderate to severe tricuspid regurgitation. The tricuspid regurgitation reportedly looked worse than previous echocardiograms and there was question of a possible flail leaflet of the tricuspid valve. TEE was performed October 03, 2017 and confirmed the presence of severe tricuspid regurgitation. There was normal left ventricular systolic function with ejection fraction estimated 55 to 60%. The aortic and mitral valves were normal. The patient was subsequently referred for elective surgical consultation.  The patient is single and lives with her boyfriend in [place]. 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 She complains of progressive exertional shortness  of breath with swelling of both arms, both legs, and her abdomen. Symptoms are transiently improved by diuretic therapy with Lasix but the patient continues to struggle. She denies any  exertional chest pain or chest tightness. She reports occasional fleeting tightness across her chest. She reports occasional palpitations without dizzy spells or syncope. She has continued to gain weight, currently weighing 300 pounds. She reports intermittent episodes of sharp right upper quadrant abdominal pain typically developed 45 minutes to 1 hour after meals. Symptoms are sometimes crampy in nature and usually subside. She states that she weighed 160 pounds when she was in jail 3 years ago. She maintains that she quit smoking last February and she has not used any type of illicit drug since she moved to [place] 15 months ago. She had a negative drug screen at the time of her initial evaluation in November 2018.  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 She was originally seen in consultation on October 30, 2017. Since then she underwent dental extraction by Dr. Robin Searing, and more recently she underwent laparoscopic cholecystectomy by Dr.Hoxworth.She recovered from both of these uneventfully, and she was recently seen in follow-up by Dr. Bing Matter. She has developed worsening symptoms of exertional shortness of breath, for which her dose of Lasix has recently been increased. She has been referred back to our office today for follow-up to further discuss possible elective tricuspid valve repair or replacement. She states that her breathing is comfortable at rest but she gets short of breath with moderate level activity. She has had some increased shortness of breath, fluid retention, and mild lower extremity edema. She denies any fevers, chills, or productive cough. She has not had any abdominal pain. She does not smoke cigarettes and  she has been drug-free since July 2018. The remainder of her review of systems is unremarkable.  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 She was originally seen in consultation on August 20, 2019and she was last seen here in our office recently on March 28, 2018. Since then she underwent follow-up echocardiogram and coronary CT angiography. Echocardiogram revealed normal left ventricular systolic function with only mild right ventricular chamber enlargement and normal right ventricular function. There remains severe tricuspid regurgitation. No other significant abnormalities were noted. Coronary CT angiography revealed normal coronary artery anatomy with no sign of any significant coronary artery disease. Patient returns for office today with tentative plans to proceed with elective mitral valve repair or replacement next week. She reports no new problems or complaints.  Dr. Cornelius Moras again reviewed the indications, risk, and potential benefits of tricuspid valve repair or replacement with the patient in the office today.Alternative treatment strategies have been discussed. Because of the patient's morbid obesity and body habitus, Dr. Cornelius Moras would not consider a candidate for minimally invasive approach for surgery. She understands that surgical intervention will require conventional median sternotomy. She understands that there remains a possibility that her valve would not be repairable in which case it would need to be replaced using a bioprosthetic tissue valve. She understands that there is some risk of developing complete heart block and possible need for permanent pacemaker placement. Expectations regarding  her postoperative convalescence have  been discussed.Sheunderstandsand acceptsall potential risks of surgery. Pre operative carotid duplex US showed no significant internal carotid artery stenosis bilaterally. She was admitted on 02/06 in order to undergo a TV repair, complex valvuloplasty, and neo chords x 4.  Brief Hospital Course:  The patient was extubated the evening of surgery without difficulty. He/she remained afebrile and hemodynamically stable. Theone MurdochSwan Ganz, a line, and foley were removed early in the post operative course. Chest tubes werer removed on 02/08. She had incisional pain so she was started on Toradol. She has a history of substance abuse and was restarted on Methadone. She was volume over loaded and diuresed. She will need diuretic as outpatient.  He/she had ABL anemia. She did not require a post op transfusion. Last H and H was 8.7/27.8.  She was weaned off the insulin drip.  The patient's HGA1C pre op was  5.8. The patient was felt surgically stable for transfer from the ICU to [facility] for further convalescence on 02/09. She continues to progress with cardiac rehab. She was ambulating on room air. She has been tolerating a diet and has had a bowel movement. Epicardial pacing wires were removed on 02/10  Chest tube sutures will be removed in the office. The patient is felt surgically stable for discharge today.   Latest Vital Signs: Blood pressure 109/67, pulse 64, temperature 98.5 F (36.9 C), temperature source Oral, resp. rate 14, height 5\' 8"  (1.727 m), weight (!) 148.5 kg, last menstrual period 04/04/2018, SpO2 97 %.    General appearance: alert, cooperative and no distress Heart: regular rate and rhythm, no rub and no murmur Lungs: clear to auscultation bilaterally Abdomen: benign Extremities: no significant edema Wound: incis healing well  Discharge Condition: Stable and discharged to home.  Recent laboratory studies:  Lab Results  Component Value Date   WBC 6.7 04/22/2018   HGB 8.7 (L) 04/22/2018     HCT 27.8 (L) 04/22/2018   MCV 92.4 04/22/2018   PLT 152 04/22/2018   Lab Results  Component Value Date   NA 139 04/22/2018   K 4.1 04/22/2018   CL 106 04/22/2018   CO2 26 04/22/2018   CREATININE 0.84 04/22/2018   GLUCOSE 110 (H) 04/22/2018      Diagnostic Studies: Dg Chest 2 View  Result Date: 04/22/2018 CLINICAL DATA:  Follow-up chest surgery.  Chest pain and soreness. EXAM: CHEST - 2 VIEW COMPARISON:  04/21/2018 FINDINGS: Previous median sternotomy and mitral valve replacement. Right internal jugular central line tip in the SVC 3 cm above the right atrium. Slight improvement of bibasilar atelectasis. No worsening or new finding. IMPRESSION: Slowly improving bibasilar atelectasis. No worsening or new finding. Electronically Signed   By: Paulina FusiMark  Shogry M.D.   On: 04/22/2018 08:59   Dg Chest 2 View  Result Date: 04/15/2018 CLINICAL DATA:  Pre CABG evaluation. EXAM: CHEST - 2 VIEW COMPARISON:  03/14/2017. FINDINGS: Decreased inspiration. Grossly stable normal sized heart. Minimal lingular atelectasis. Clear right lung. Stable right diaphragmatic eventration and cholecystectomy clips. Minimal thoracic spine degenerative changes. IMPRESSION: Decreased inspiration with minimal lingular atelectasis. Electronically Signed   By: Beckie SaltsSteven  Reid M.D.   On: 04/15/2018 15:44   Ct Coronary Morph W/cta Cor W/score W/ca W/cm &/or Wo/cm  Addendum Date: 04/01/2018   ADDENDUM REPORT: 04/01/2018 18:04 CLINICAL DATA:  33 year old female with h/o stage D severe symptomatic tricuspid regurgitation post right-sided bacterial endocarditis associated with IV drug abuse. Evaluated coronary arteries prior to TV repair. EXAM: Cardiac/Coronary  CT TECHNIQUE: The patient was scanned on a [employer]. FINDINGS: A 120 kV prospective scan was triggered in the descending thoracic aorta at 111 HU's. Axial non-contrast 3 mm slices were carried out through the heart. The data set was analyzed on a dedicated work  station and scored using the Agatson method. Gantry rotation speed was 250 msecs and collimation was .6 mm. No beta blockade and 0.8 mg of sl NTG was given. The 3D data set was reconstructed in 5% intervals of the 67-82 % of the R-R cycle. Diastolic phases were analyzed on a dedicated work station using MPR, MIP and VRT modes. The patient received 80 cc of contrast. Limited quality scan secondary to wrong contrast timing. Aorta:  Normal size.  No calcifications.  No dissection. Aortic Valve:  Trileaflet.  No calcifications. Coronary Arteries:  Normal coronary origin.  Right dominance. RCA is a large dominant artery that gives rise to PDA and PLVB. There is no plaque. Left main is a large artery that gives rise to LAD and LCX arteries. LAD is a large vessel that has no plaque. LCX is a non-dominant artery that gives rise to one large OM1 branch. There is no plaque. Other findings: Normal pulmonary vein drainage into the left atrium. Normal let atrial appendage without a thrombus. Normal size of the pulmonary artery. IMPRESSION: 1. Coronary calcium score of 0. This was 0 percentile for age and sex matched control. 2. Normal coronary origin with right dominance. 3. No evidence of CAD. 4. Mildly dilated pulmonary artery measuring 31 mm suggestive of pulmonary hypertension. 5.  Severely dilated right atrium. Electronically Signed   By: Tobias Alexander   On: 04/01/2018 18:04   Result Date: 04/01/2018 EXAM: OVER-READ INTERPRETATION  CT CHEST The following report is an over-read performed by radiologist Dr. Irish Lack of [facility] on 04/01/2018. This over-read does not include interpretation of cardiac or coronary anatomy or pathology. The cardiac CT interpretation by the cardiologist is attached. COMPARISON:  None. FINDINGS: Vascular: No incidental findings. Mediastinum/Nodes: No lymphadenopathy or masses identified in the visualized mediastinum and hilar regions. Lungs/Pleura: There is some  atelectasis and scarring in the lingula. Visualized lungs show no evidence of pulmonary edema, consolidation, pneumothorax, nodule or pleural fluid. Upper Abdomen: No acute abnormality. Musculoskeletal: No chest wall mass or suspicious bone lesions identified. IMPRESSION: No significant incidental findings. Scarring and atelectasis present in the lingula. Electronically Signed: By: Irish Lack M.D. On: 04/01/2018 10:01   Dg Chest Port 1 View  Result Date: 04/21/2018 CLINICAL DATA:  Tricuspid regurgitation. EXAM: PORTABLE CHEST 1 VIEW COMPARISON:  Multiple prior chest films. FINDINGS: The right IJ catheter is stable.  The tip is in the mid distal SVC. Stable surgical changes from tricuspid valve replacement. Persistent streaky bibasilar atelectasis and small pleural effusions, slightly improved. The mediastinal drain tubes have been removed. No pneumothorax is identified. IMPRESSION: Removal of mediastinal drain tubes. Persistent but slightly improved bibasilar atelectasis and small effusions. Electronically Signed   By: Rudie Meyer M.D.   On: 04/21/2018 09:07   Dg Chest Port 1 View  Result Date: 04/20/2018 CLINICAL DATA:  Pleural effusion. Pt was able to communicate EXAM: PORTABLE CHEST 1 VIEW COMPARISON:  04/19/2018 FINDINGS: RIGHT IJ central line tip overlies the superior vena cava. Mediastinal drain is unchanged. There is persistent subsegmental atelectasis at the lung bases. No new consolidation. No pulmonary edema. IMPRESSION: 1. Stable support apparatus. 2. Persistent bibasilar atelectasis. Electronically Signed   By: Norva Pavlov  M.D.   On: 04/20/2018 09:03   Dg Chest Port 1 View  Result Date: 04/19/2018 CLINICAL DATA:  Chest tube. TVR. EXAM: PORTABLE CHEST 1 VIEW COMPARISON:  Chest x-ray 04/18/2018. FINDINGS: Right IJ line, mediastinal drainage catheters in stable position. Prior median sternotomy and cardiac valve replacement. Stable cardiomegaly. Increased interstitial prominence  suggesting the possibility of CHF. Progressive bibasilar atelectasis/infiltrates. No pleural effusion or pneumothorax. No acute bony abnormality. IMPRESSION: 1. Right IJ line and mediastinal drainage catheters in stable position. 2. Cardiomegaly. Mild bilateral interstitial prominence. Mild CHF may be present. 3.  Progressive bibasilar atelectasis/infiltrates. Electronically Signed   By: Maisie Fus  Register   On: 04/19/2018 08:24   Dg Chest Port 1 View  Result Date: 04/18/2018 CLINICAL DATA:  Atelectasis ; post op tricuspid valve repair EXAM: PORTABLE CHEST 1 VIEW COMPARISON:  04/15/2018 FINDINGS: Since the prior exam, cardiac surgery has been performed. The cardiac silhouette is normal in size and configuration. No mediastinal widening. Lung volumes are low. There is mild atelectasis at the bases. No evidence of pulmonary edema. No pneumothorax. There is a mediastinal tube and a right internal jugular central venous catheter, the latter with its tip in the mid superior vena cava. IMPRESSION: 1. Status post cardiac surgery with tricuspid valve repair. 2. No evidence of an operative complication. No mediastinal widening, pulmonary edema or pneumothorax. 3. Mild basilar atelectasis. Electronically Signed   By: Amie Portland M.D.   On: 04/18/2018 14:15   Vas US Doppler Pre Cabg  Result Date: 04/16/2018 PREOPERATIVE VASCULAR EVALUATION  Indications: Pre TVR evaluation. Performing Technologist: Melodie Bouillon RDMS RVT  Examination Guidelines: A complete evaluation includes B-mode imaging, spectral Doppler, color Doppler, and power Doppler as needed of all accessible portions of each vessel. Bilateral testing is considered an integral part of a complete examination. Limited examinations for reoccurring indications may be performed as noted.  Right Carotid Findings: +----------+--------+--------+--------+----------------------+--------+           PSV cm/sEDV cm/sStenosisDescribe              Comments  +----------+--------+--------+--------+----------------------+--------+ CCA Prox  95      22                                             +----------+--------+--------+--------+----------------------+--------+ CCA Distal98      39                                             +----------+--------+--------+--------+----------------------+--------+ ICA Prox  65      26      1-39%   diffuse and hypoechoic         +----------+--------+--------+--------+----------------------+--------+ ICA Distal60      26                                             +----------+--------+--------+--------+----------------------+--------+ ECA       134     26                                             +----------+--------+--------+--------+----------------------+--------+  Portions of this table do not appear on this page. +----------+--------+-------+--------+------------+           PSV cm/sEDV cmsDescribeArm Pressure +----------+--------+-------+--------+------------+ Subclavian98                     127          +----------+--------+-------+--------+------------+ +---------+--------+--+--------+--+---------+ VertebralPSV cm/s61EDV cm/s25Antegrade +---------+--------+--+--------+--+---------+ Left Carotid Findings: +----------+--------+--------+--------+----------------------+--------+           PSV cm/sEDV cm/sStenosisDescribe              Comments +----------+--------+--------+--------+----------------------+--------+ CCA Prox  110     36                                             +----------+--------+--------+--------+----------------------+--------+ CCA Distal84      24                                             +----------+--------+--------+--------+----------------------+--------+ ICA Prox  93      32      1-39%   diffuse and hypoechoic         +----------+--------+--------+--------+----------------------+--------+ ICA Mid   69      20                                              +----------+--------+--------+--------+----------------------+--------+ ICA Distal70      28                                             +----------+--------+--------+--------+----------------------+--------+ ECA       118     26                                             +----------+--------+--------+--------+----------------------+--------+ +----------+--------+--------+--------+------------+ SubclavianPSV cm/sEDV cm/sDescribeArm Pressure +----------+--------+--------+--------+------------+           55      13              124          +----------+--------+--------+--------+------------+ +---------+--------+--+--------+--+---------+ VertebralPSV cm/s42EDV cm/s17Antegrade +---------+--------+--+--------+--+---------+  ABI Findings: +--------+------------------+-----+---------+--------+ Right   Rt Pressure (mmHg)IndexWaveform Comment  +--------+------------------+-----+---------+--------+ [id number]                    triphasic         +--------+------------------+-----+---------+--------+ +--------+------------------+-----+---------+-------+ Left    Lt Pressure (mmHg)IndexWaveform Comment +--------+------------------+-----+---------+-------+ [id number]                    triphasic        +--------+------------------+-----+---------+-------+  Right Doppler Findings: +--------+--------+-----+------------------+--------+ Site    PressureIndexDoppler           Comments +--------+--------+-----+------------------+--------+ [id number]          triphasic                  +--------+--------+-----+------------------+--------+ Radial  irregular biphasic         +--------+--------+-----+------------------+--------+ Ulnar                triphasic                  +--------+--------+-----+------------------+--------+  Left Doppler Findings: +--------+--------+-----+---------+--------+ Site     PressureIndexDoppler  Comments +--------+--------+-----+---------+--------+ [id number]          triphasic         +--------+--------+-----+---------+--------+ Radial               triphasic         +--------+--------+-----+---------+--------+ Ulnar                triphasic         +--------+--------+-----+---------+--------+  Summary: Right Carotid: Velocities in the right ICA are consistent with a 1-39% stenosis. Left Carotid: Velocities in the left ICA are consistent with a 1-39% stenosis. Vertebrals: Bilateral vertebral arteries demonstrate antegrade flow. Right Upper Extremity: Doppler waveforms remain within normal limits with right radial compression. Doppler waveform obliterate with right ulnar compression. Left Upper Extremity: Doppler waveforms remain within normal limits with left radial compression. Doppler waveforms decrease <50% with left ulnar compression.  Electronically signed by Fabienne Bruns MD on 04/16/2018 at 10:27:26 AM.    Final      Discharge Instructions    Discharge patient   Complete by:  As directed    Discharge disposition:  01-Home or Self Care   Discharge patient date:  04/23/2018      Discharge Medications: Allergies as of 04/23/2018   No Known Allergies     Medication List    STOP taking these medications   ibuprofen 200 MG tablet Commonly known as:  ADVIL,MOTRIN   metoprolol tartrate 100 MG tablet Commonly known as:  LOPRESSOR     TAKE these medications   aspirin 325 MG EC tablet Take 1 tablet (325 mg total) by mouth daily. Start taking on:  April 24, 2018   ferrous fumarate-b12-vitamic C-folic acid capsule Commonly known as:  TRINSICON / FOLTRIN Take 1 capsule by mouth 2 (two) times daily with a meal.   furosemide 80 MG tablet Commonly known as:  LASIX Take 0.5 tablets (40 mg total) by mouth daily. What changed:  how much to take   methadone 10 MG/5ML solution Commonly known as:  DOLOPHINE Take 85 mg by mouth  daily.   omeprazole 20 MG capsule Commonly known as:  PRILOSEC Take 1 capsule (20 mg total) by mouth 2 (two) times daily before a meal.   oxyCODONE 5 MG immediate release tablet Commonly known as:  Oxy IR/ROXICODONE Take 1-2 tablets (5-10 mg total) by mouth every 6 (six) hours as needed for up to 7 days for severe pain.   potassium chloride 10 MEQ tablet Commonly known as:  K-DUR,KLOR-CON Take 10 mEq by mouth daily.      The patient has been discharged on:   1.Beta Blocker:  Yes [   ]                              No   [ n  ]                              If No, reason:stable rhythm  2.Ace Inhibitor/ARB: Yes [   ]  No  [ n   ]                                     If No, reason:low relative BP  3.Statin:   Yes [   ]                  No  [ x  ]                  If No, reason:No CAD 4.Marlowe Kays:  Yes  [  x ]                  No   [   ]                  If No, reason:  Follow Up Appointments: Follow-up Information    [name], MD. Go on 05/21/2018.   Specialty:  Cardiology Why:  Appointment time is at 8:20 am Contact information: [address] [place] [address] [phone]        Purcell Nails, MD. Go on 05/27/2018.   Specialty:  Cardiothoracic Surgery Why:  PA/LAT CXR to be taken (at [facility] which is in the same building as Dr. Orvan July office) on 03/16 at 12:30 pm;Appointment time is at 1 :00 pm Contact information: [address] [employer] [address] [place] [address] [phone]        Nurse. Go on 05/07/2018.   Why:  Appointment is with nurse only to have chest tube sutures removed. Appointment time is at  Contact information: [address] [employer] [place] [address]          Signed: Noel Christmas 04/23/2018, 10:59 AM

## 2018-04-20 ENCOUNTER — Inpatient Hospital Stay (HOSPITAL_COMMUNITY): Payer: Self-pay

## 2018-04-20 LAB — BASIC METABOLIC PANEL
Anion gap: 11 (ref 5–15)
BUN: 10 mg/dL (ref 6–20)
CO2: 25 mmol/L (ref 22–32)
Calcium: 8.4 mg/dL — ABNORMAL LOW (ref 8.9–10.3)
Chloride: 102 mmol/L (ref 98–111)
Creatinine, Ser: 0.89 mg/dL (ref 0.44–1.00)
GFR calc Af Amer: >60 mL/min (ref >60)
GFR calc non Af Amer: >60 mL/min (ref >60)
Glucose, Bld: 133 mg/dL — ABNORMAL HIGH (ref 70–99)
POTASSIUM: 3.7 mmol/L (ref 3.5–5.1)
Sodium: 138 mmol/L (ref 135–145)

## 2018-04-20 LAB — CBC
HCT: 31.1 % — ABNORMAL LOW (ref 36.0–46.0)
Hemoglobin: 9.9 g/dL — ABNORMAL LOW (ref 12.0–15.0)
MCH: 29.2 pg (ref 26.0–34.0)
MCHC: 31.8 g/dL (ref 30.0–36.0)
MCV: 91.7 fL (ref 80.0–100.0)
PLATELETS: 167 10*3/uL (ref 150–400)
RBC: 3.39 MIL/uL — ABNORMAL LOW (ref 3.87–5.11)
RDW: 12.6 % (ref 11.5–15.5)
WBC: 11.9 10*3/uL — ABNORMAL HIGH (ref 4.0–10.5)
nRBC: 0 % (ref 0.0–0.2)

## 2018-04-20 LAB — GLUCOSE, CAPILLARY
Glucose-Capillary: 123 mg/dL — ABNORMAL HIGH (ref 70–99)
Glucose-Capillary: 123 mg/dL — ABNORMAL HIGH (ref 70–99)
Glucose-Capillary: 126 mg/dL — ABNORMAL HIGH (ref 70–99)

## 2018-04-20 MED ORDER — FUROSEMIDE 40 MG PO TABS
40.0000 mg | ORAL_TABLET | Freq: Two times a day (BID) | ORAL | Status: DC
  Administered 2018-04-20 – 2018-04-21 (×3): 40 mg via ORAL
  Filled 2018-04-20 (×3): qty 1

## 2018-04-20 MED ORDER — CHLORHEXIDINE GLUCONATE CLOTH 2 % EX PADS
6.0000 | MEDICATED_PAD | Freq: Every day | CUTANEOUS | Status: DC
  Administered 2018-04-20 – 2018-04-21 (×2): 6 via TOPICAL

## 2018-04-20 MED ORDER — SODIUM CHLORIDE 0.9% FLUSH
10.0000 mL | Freq: Two times a day (BID) | INTRAVENOUS | Status: DC
  Administered 2018-04-20 – 2018-04-21 (×2): 10 mL

## 2018-04-20 MED ORDER — SODIUM CHLORIDE 0.9% FLUSH: 10.0000 mL | INTRAVENOUS | Status: DC | PRN

## 2018-04-20 MED ORDER — FUROSEMIDE 10 MG/ML IJ SOLN
40.0000 mg | Freq: Two times a day (BID) | INTRAMUSCULAR | Status: DC
  Administered 2018-04-20: 40 mg via INTRAVENOUS
  Filled 2018-04-20 (×2): qty 4

## 2018-04-20 MED ORDER — POTASSIUM CHLORIDE CRYS ER 20 MEQ PO TBCR
20.0000 meq | EXTENDED_RELEASE_TABLET | ORAL | Status: AC
  Administered 2018-04-20 (×3): 20 meq via ORAL
  Filled 2018-04-20 (×3): qty 1

## 2018-04-20 MED ORDER — SALINE SPRAY 0.65 % NA SOLN
1.0000 | NASAL | Status: DC | PRN
  Filled 2018-04-20: qty 44

## 2018-04-20 NOTE — Progress Notes (Signed)
2 Days Post-Op Procedure(s) (LRB): TRICUSPID VALVE REPAIR (N/A) TRANSESOPHAGEAL ECHOCARDIOGRAM (TEE) (N/A) Subjective: Some incisional pain  Objective: Vital signs in last 24 hours: Temp:  [97.7 F (36.5 C)-99 F (37.2 C)] 98.2 F (36.8 C) (02/08 0825) Pulse Rate:  [66-100] 94 (02/08 0711) Cardiac Rhythm: Normal sinus rhythm;Bundle branch block (02/08 0400) Resp:  [3-19] 14 (02/08 0711) BP: (97-119)/(63-80) 104/65 (02/08 0500) SpO2:  [86 %-99 %] 93 % (02/08 0835) Weight:  [148.9 kg] 148.9 kg (02/08 0500)  Hemodynamic parameters for last 24 hours:    Intake/Output from previous day: 02/07 0701 - 02/08 0700 In: 1003.2 [P.O.:596; I.V.:184.9; IV Piggyback:222.3] Out: 2245 [Urine:1945; Chest Tube:300] Intake/Output this shift: No intake/output data recorded.  General appearance: alert, cooperative and no distress Neurologic: intact Heart: regular rate and rhythm Lungs: diminished breath sounds bibasilar  Lab Results: Recent Labs    04/19/18 1654 04/20/18 0424  WBC 13.8* 11.9*  HGB 10.8* 9.9*  HCT 33.1* 31.1*  PLT 191 167   BMET:  Recent Labs    04/19/18 1654 04/20/18 0424  NA 137 138  K 3.8 3.7  CL 104 102  CO2 25 25  GLUCOSE 129* 133*  BUN 7 10  CREATININE 0.81 0.89  CALCIUM 8.3* 8.4*    PT/INR:  Recent Labs    04/18/18 1400  LABPROT 16.6*  INR 1.35   ABG    Component Value Date/Time   PHART 7.322 (L) 04/18/2018 1455   HCO3 21.7 04/18/2018 1455   TCO2 23 04/18/2018 1455   ACIDBASEDEF 4.0 (H) 04/18/2018 1455   O2SAT 93.0 04/18/2018 1455   CBG (last 3)  Recent Labs    04/19/18 1629 04/19/18 2005 04/20/18 0052  GLUCAP 119* 137* 123*    Assessment/Plan: S/P Procedure(s) (LRB): TRICUSPID VALVE REPAIR (N/A) TRANSESOPHAGEAL ECHOCARDIOGRAM (TEE) (N/A) -CV- stable in SR  RESP- still on HFNC, currently at 10L/min  CXR shows atelectasis- continue IS and flutter  RENAL- creatinine and lytes Ok  Continue diuresis  Dc Chest tubes  SCD +  enoxaparin for DVT prophylaxis  Ambulate   LOS: 2 days    Loreli Slot 04/20/2018

## 2018-04-20 NOTE — Progress Notes (Signed)
When pulling chest tubes, noted that suture out on right ms tunbe. Dr Dorris Fetch notified, stated to pull chest tube. Vaseline gauze around site. No oozing, noted, VS stable. , no Shortness of breath. Covered with gauze and taped. Educated patient on if any bleeding, shortness of breath, to let nurse know.

## 2018-04-20 NOTE — Progress Notes (Signed)
      [address]       [place] [address]             [phone]      Up in chair  BP (!) 99/59   Pulse 67   Temp 98.8 F (37.1 C) (Oral)   Resp 13   Ht 5\' 8"  (1.727 m)   Wt (!) 148.9 kg   LMP 04/04/2018 (Approximate)   SpO2 96%   BMI 49.91 kg/m   Intake/Output Summary (Last 24 hours) at 04/20/2018 1835 Last data filed at 04/20/2018 1975 Gross per 24 hour  Intake 350.06 ml  Output 460 ml  Net -109.94 ml   Down to 3L O2  Continue current Rx  Steven C. Dorris Fetch, MD [facility] [phone]

## 2018-04-20 NOTE — Progress Notes (Signed)
Recognizes the importance of IS and flutter has done a good job of working with both, down to 3LPM  o2 saturations improving. Needs to be encouraged to move, ambulate. Wants to do things in own time. Will d/c foley after dinner per request. Has taken frequent rest breaks. Moving up to soft foods for dinner and tolerating with no complaints of nausea. No leakage around chest tube sites. Pain controlled with ordered medications

## 2018-04-21 ENCOUNTER — Inpatient Hospital Stay (HOSPITAL_COMMUNITY): Payer: Self-pay

## 2018-04-21 LAB — BASIC METABOLIC PANEL
Anion gap: 9 (ref 5–15)
BUN: 11 mg/dL (ref 6–20)
CO2: 27 mmol/L (ref 22–32)
Calcium: 8.3 mg/dL — ABNORMAL LOW (ref 8.9–10.3)
Chloride: 103 mmol/L (ref 98–111)
Creatinine, Ser: 0.8 mg/dL (ref 0.44–1.00)
GFR calc Af Amer: >60 mL/min (ref >60)
GFR calc non Af Amer: >60 mL/min (ref >60)
Glucose, Bld: 119 mg/dL — ABNORMAL HIGH (ref 70–99)
Potassium: 3.8 mmol/L (ref 3.5–5.1)
Sodium: 139 mmol/L (ref 135–145)

## 2018-04-21 LAB — CBC
HEMATOCRIT: 29.9 % — AB (ref 36.0–46.0)
Hemoglobin: 9.4 g/dL — ABNORMAL LOW (ref 12.0–15.0)
MCH: 29 pg (ref 26.0–34.0)
MCHC: 31.4 g/dL (ref 30.0–36.0)
MCV: 92.3 fL (ref 80.0–100.0)
Platelets: 153 10*3/uL (ref 150–400)
RBC: 3.24 MIL/uL — ABNORMAL LOW (ref 3.87–5.11)
RDW: 12.7 % (ref 11.5–15.5)
WBC: 8.5 10*3/uL (ref 4.0–10.5)
nRBC: 0 % (ref 0.0–0.2)

## 2018-04-21 MED ORDER — KETOROLAC TROMETHAMINE 15 MG/ML IJ SOLN
15.0000 mg | Freq: Four times a day (QID) | INTRAMUSCULAR | Status: AC
  Administered 2018-04-21 – 2018-04-22 (×3): 15 mg via INTRAVENOUS
  Filled 2018-04-21 (×3): qty 1

## 2018-04-21 MED ORDER — MOVING RIGHT ALONG BOOK
Freq: Once | Status: AC
  Administered 2018-04-21: 19:00:00
  Filled 2018-04-21: qty 1

## 2018-04-21 MED ORDER — SODIUM CHLORIDE 0.9% FLUSH: 3.0000 mL | INTRAVENOUS | Status: DC | PRN

## 2018-04-21 MED ORDER — SODIUM CHLORIDE 0.9% FLUSH
3.0000 mL | Freq: Two times a day (BID) | INTRAVENOUS | Status: DC
  Administered 2018-04-21 – 2018-04-23 (×4): 3 mL via INTRAVENOUS

## 2018-04-21 MED ORDER — SODIUM CHLORIDE 0.9 % IV SOLN: 250.0000 mL | INTRAVENOUS | Status: DC | PRN

## 2018-04-21 MED ORDER — MAGNESIUM HYDROXIDE 400 MG/5ML PO SUSP: 30.0000 mL | Freq: Every day | ORAL | Status: DC | PRN

## 2018-04-21 MED ORDER — ALUM & MAG HYDROXIDE-SIMETH 200-200-20 MG/5ML PO SUSP: 15.0000 mL | Freq: Four times a day (QID) | ORAL | Status: DC | PRN

## 2018-04-21 MED ORDER — POTASSIUM CHLORIDE CRYS ER 20 MEQ PO TBCR
20.0000 meq | EXTENDED_RELEASE_TABLET | ORAL | Status: AC
  Administered 2018-04-21 (×2): 20 meq via ORAL
  Filled 2018-04-21 (×2): qty 1

## 2018-04-21 NOTE — Progress Notes (Signed)
3 Days Post-Op Procedure(s) (LRB): TRICUSPID VALVE REPAIR (N/A) TRANSESOPHAGEAL ECHOCARDIOGRAM (TEE) (N/A) Subjective: Some incisional pain- requesting toradol, says that worked better than anything  Objective: Vital signs in last 24 hours: Temp:  [98.2 F (36.8 C)-98.9 F (37.2 C)] 98.9 F (37.2 C) (02/09 0728) Pulse Rate:  [67-86] 75 (02/09 0400) Cardiac Rhythm: Normal sinus rhythm (02/09 0200) Resp:  [10-21] 13 (02/09 0400) BP: (92-149)/(54-105) 103/59 (02/09 0400) SpO2:  [92 %-97 %] 94 % (02/09 0400)  Hemodynamic parameters for last 24 hours:    Intake/Output from previous day: 02/08 0701 - 02/09 0700 In: 840 [P.O.:840] Out: 840 [Urine:840] Intake/Output this shift: No intake/output data recorded.  General appearance: alert, cooperative and no distress Neurologic: intact Heart: regular rate and rhythm Lungs: diminished breath sounds bibasilar Abdomen: normal findings: soft, non-tender  Lab Results: Recent Labs    04/20/18 0424 04/21/18 0441  WBC 11.9* 8.5  HGB 9.9* 9.4*  HCT 31.1* 29.9*  PLT 167 153   BMET:  Recent Labs    04/20/18 0424 04/21/18 0441  NA 138 139  K 3.7 3.8  CL 102 103  CO2 25 27  GLUCOSE 133* 119*  BUN 10 11  CREATININE 0.89 0.80  CALCIUM 8.4* 8.3*    PT/INR:  Recent Labs    04/18/18 1400  LABPROT 16.6*  INR 1.35   ABG    Component Value Date/Time   PHART 7.322 (L) 04/18/2018 1455   HCO3 21.7 04/18/2018 1455   TCO2 23 04/18/2018 1455   ACIDBASEDEF 4.0 (H) 04/18/2018 1455   O2SAT 93.0 04/18/2018 1455   CBG (last 3)  Recent Labs    04/20/18 0052 04/20/18 1448 04/20/18 1706  GLUCAP 123* 123* 126*    Assessment/Plan: S/P Procedure(s) (LRB): TRICUSPID VALVE REPAIR (N/A) TRANSESOPHAGEAL ECHOCARDIOGRAM (TEE) (N/A) -POD  # 3 TV repair  CV- stable in SR  RESP- continue IS for atelectasis, on 4L [place]  RENAL- creatinine normal  Still volume overloaded- continue lasix 40 BID  ENDO- CBG well controlled  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

## 2018-04-21 NOTE — Progress Notes (Signed)
Report called to RN on 4 E transferred via wheelchair with telemetry. No acute distress

## 2018-04-22 ENCOUNTER — Inpatient Hospital Stay (HOSPITAL_COMMUNITY): Payer: Self-pay

## 2018-04-22 LAB — BASIC METABOLIC PANEL
Anion gap: 7 (ref 5–15)
BUN: 11 mg/dL (ref 6–20)
CALCIUM: 8.2 mg/dL — AB (ref 8.9–10.3)
CO2: 26 mmol/L (ref 22–32)
Chloride: 106 mmol/L (ref 98–111)
Creatinine, Ser: 0.84 mg/dL (ref 0.44–1.00)
GFR calc Af Amer: >60 mL/min (ref >60)
GFR calc non Af Amer: >60 mL/min (ref >60)
Glucose, Bld: 110 mg/dL — ABNORMAL HIGH (ref 70–99)
Potassium: 4.1 mmol/L (ref 3.5–5.1)
SODIUM: 139 mmol/L (ref 135–145)

## 2018-04-22 LAB — CBC
HCT: 27.8 % — ABNORMAL LOW (ref 36.0–46.0)
Hemoglobin: 8.7 g/dL — ABNORMAL LOW (ref 12.0–15.0)
MCH: 28.9 pg (ref 26.0–34.0)
MCHC: 31.3 g/dL (ref 30.0–36.0)
MCV: 92.4 fL (ref 80.0–100.0)
Platelets: 152 10*3/uL (ref 150–400)
RBC: 3.01 MIL/uL — ABNORMAL LOW (ref 3.87–5.11)
RDW: 12.8 % (ref 11.5–15.5)
WBC: 6.7 10*3/uL (ref 4.0–10.5)
nRBC: 0 % (ref 0.0–0.2)

## 2018-04-22 MED ORDER — KETOROLAC TROMETHAMINE 15 MG/ML IJ SOLN
15.0000 mg | Freq: Four times a day (QID) | INTRAMUSCULAR | Status: AC
  Administered 2018-04-22 – 2018-04-23 (×4): 15 mg via INTRAVENOUS
  Filled 2018-04-22 (×4): qty 1

## 2018-04-22 MED ORDER — FUROSEMIDE 10 MG/ML IJ SOLN
40.0000 mg | Freq: Two times a day (BID) | INTRAMUSCULAR | Status: AC
  Administered 2018-04-22 (×2): 40 mg via INTRAVENOUS
  Filled 2018-04-22 (×2): qty 4

## 2018-04-22 MED ORDER — FE FUMARATE-B12-VIT C-FA-IFC PO CAPS
1.0000 | ORAL_CAPSULE | Freq: Three times a day (TID) | ORAL | Status: DC
  Administered 2018-04-22 – 2018-04-23 (×4): 1 via ORAL
  Filled 2018-04-22 (×3): qty 1

## 2018-04-22 NOTE — Progress Notes (Signed)
CARDIAC REHAB PHASE I   PRE:  Rate/Rhythm: 87 SR  BP:  Supine:   Sitting: 111/66  Standing:    SaO2: 95%RA  MODE:  Ambulation: 400 ft   POST:  Rate/Rhythm: 101 ST  BP:  Supine:   Sitting: 113/59  Standing:    SaO2: 95%RA 1045-1110 Pt walked 400 ft on RA independently with steady gait. Stopped once to rest. Tolerated well on RA. Pt stated she has 17 stairs to get to her home. Would like to practice prior to discharge. Would recommend PT for stair training.  Pt assisted to bed after walk.   Luetta Nutting, RN BSN  04/22/2018 11:06 AM

## 2018-04-22 NOTE — Progress Notes (Addendum)
      [address]       [name] [address]             [phone]     CARDIOTHORACIC SURGERY PROGRESS NOTE  4 Days Post-Op  S/P Procedure(s) (LRB): TRICUSPID VALVE REPAIR (N/A) TRANSESOPHAGEAL ECHOCARDIOGRAM (TEE) (N/A)  Subjective: Doing well.  Mild soreness.  Objective: Vital signs in last 24 hours: Temp:  [98 F (36.7 C)-98.7 F (37.1 C)] 98 F (36.7 C) (02/10 0557) Pulse Rate:  [64-91] 78 (02/10 0733) Cardiac Rhythm: Normal sinus rhythm;Bundle branch block (02/09 1900) Resp:  [12-17] 14 (02/10 0733) BP: (97-107)/(58-65) 105/65 (02/10 0733) SpO2:  [89 %-97 %] 94 % (02/10 0733) Weight:  [149.1 kg-149.5 kg] 149.1 kg (02/10 0332)  Physical Exam:  Rhythm:   sinus  Breath sounds: clear  Heart sounds:  RRR  Incisions:  Clean and dry  Abdomen:  Soft, non-distended, non-tender  Extremities:  Warm, well-perfused    Intake/Output from previous day: 02/09 0701 - 02/10 0700 In: 240 [P.O.:240] Out: -  Intake/Output this shift: No intake/output data recorded.  Lab Results: Recent Labs    04/21/18 0441 04/22/18 0428  WBC 8.5 6.7  HGB 9.4* 8.7*  HCT 29.9* 27.8*  PLT 153 152   BMET:  Recent Labs    04/21/18 0441 04/22/18 0428  NA 139 139  K 3.8 4.1  CL 103 106  CO2 27 26  GLUCOSE 119* 110*  BUN 11 11  CREATININE 0.80 0.84  CALCIUM 8.3* 8.2*    CBG (last 3)  Recent Labs    04/20/18 0052 04/20/18 1448 04/20/18 1706  GLUCAP 123* 123* 126*   PT/INR:  No results for input(s): LABPROT, INR in the last 72 hours.  CXR:  Looks good.  Bibasilar atelectasis improving  Assessment/Plan: S/P Procedure(s) (LRB): TRICUSPID VALVE REPAIR (N/A) TRANSESOPHAGEAL ECHOCARDIOGRAM (TEE) (N/A)  Overall doing well Maintaining NSR w/ stable BP Breathing comfortably on 2 L/min via [place] Expected post op atelectasis, improving Chronic right sided CHF with expected post-op volume excess, weight trending down now 2 kg > baseline Expected post op acute  blood loss anemia, mild   Mobilize  Diuresis  Wean O2  Possible d/c home 1-2 days   Purcell Nails, MD 04/22/2018 8:57 AM

## 2018-04-22 NOTE — Progress Notes (Signed)
Pacing wires removed intact per order. Patient instructed on bedrest and vital signs monitoring initiated.

## 2018-04-23 MED ORDER — FE FUMARATE-B12-VIT C-FA-IFC PO CAPS: 1.0000 | ORAL_CAPSULE | Freq: Two times a day (BID) | ORAL | 1 refills | Status: DC

## 2018-04-23 MED ORDER — POTASSIUM CHLORIDE 20 MEQ PO PACK
20.0000 meq | PACK | Freq: Every day | ORAL | Status: DC
  Filled 2018-04-23: qty 1

## 2018-04-23 MED ORDER — FUROSEMIDE 40 MG PO TABS
40.0000 mg | ORAL_TABLET | Freq: Every day | ORAL | Status: DC
  Filled 2018-04-23: qty 1

## 2018-04-23 MED ORDER — ASPIRIN 325 MG PO TBEC: 325.0000 mg | DELAYED_RELEASE_TABLET | Freq: Every day | ORAL | 0 refills | Status: DC

## 2018-04-23 MED ORDER — OXYCODONE HCL 5 MG PO TABS: 5.0000 mg | ORAL_TABLET | Freq: Four times a day (QID) | ORAL | 0 refills | Status: AC | PRN

## 2018-04-23 MED FILL — Magnesium Sulfate Inj 50%: INTRAMUSCULAR | Qty: 10 | Status: AC

## 2018-04-23 MED FILL — Potassium Chloride Inj 2 mEq/ML: INTRAVENOUS | Qty: 40 | Status: AC

## 2018-04-23 MED FILL — Heparin Sodium (Porcine) Inj 1000 Unit/ML: INTRAMUSCULAR | Qty: 30 | Status: AC

## 2018-04-23 NOTE — Progress Notes (Signed)
Patient is discharged home per MD orders. Central line removed per order, dressing clean, dry and intact. All discharge education and instructions, as well as follow up care explained to the patient and patient's mother. Patient will be transported home by her mother with all personal belongings.

## 2018-04-23 NOTE — Progress Notes (Signed)
CARDIAC REHAB PHASE I   PRE:  Rate/Rhythm: 75 SR  BP:  Supine: 108/66  Sitting:   Standing:    SaO2: 93%RA  MODE:  Ambulation: 470 ft   POST:  Rate/Rhythm: 102 ST  BP:  Supine:   Sitting: 123/69  Standing:    SaO2: 90-91%RA 1022-1105 Pt walked 470 ft on RA with steady gait and tolerated well. Had pt stop twice and I checked sats at 90-91%. Education completed with pt and mom who voiced understanding. Reviewed sternal precautions and staying in the tube, IS and flutter valve, smoking cessation, ex ed and watching sodium and heart healthy food choices. Pt stated has not smoked in a couple of months. Congratulated her. Will send letter of interest to [place] [facility] since cannot write order for Dr Ledora BottcherKrasowki.    Heather Nuttingharlene [name], RN BSN  04/23/2018 11:03 AM

## 2018-04-23 NOTE — Evaluation (Signed)
Physical Therapy Evaluation Patient Details Name: Heather Day MRN: [id number] DOB: [birth year] Today's Date: 04/23/2018   History of Present Illness  pt is s/p TRICUSPID VALVE REPAIR OR REPLACEMENT 04/18/2018  Clinical Impression  Pt able to perform gait and stairs without assistance.  Discussed energy conservation and home safety techniques with pt and mother, both verbalize understanding. Pt may benefit from HHPT vs cardiac rehab.    Follow Up Recommendations Home health PT    Equipment Recommendations  None recommended by PT    Recommendations for Other Services       Precautions / Restrictions Precautions Precautions: Sternal Restrictions Other Position/Activity Restrictions: sternal precautions      Mobility  Bed Mobility Overal bed mobility: Independent                Transfers Overall transfer level: Independent                  Ambulation/Gait Ambulation/Gait assistance: Independent Gait Distance (Feet): 200 Feet Assistive device: None          Stairs Stairs: Yes Stairs assistance: Supervision Stair Management: One rail Right Number of Stairs: 12 General stair comments: discussed safety, step to pattern, energy conservation  Wheelchair Mobility    Modified Rankin (Stroke Patients Only)       Balance Overall balance assessment: Independent                                           Pertinent Vitals/Pain Pain Assessment: No/denies pain    Home Living Family/patient expects to be discharged to:: Private residence Living Arrangements: Spouse/significant other Available Help at Discharge: Family Type of Home: Apartment Home Access: Stairs to enter Entrance Stairs-Rails: Psychiatric nurse of Steps: 17 Home Layout: One level Home Equipment: None      Prior Function Level of Independence: Independent               Hand Dominance        Extremity/Trunk Assessment   Upper Extremity  Assessment Upper Extremity Assessment: Generalized weakness    Lower Extremity Assessment Lower Extremity Assessment: Generalized weakness    Cervical / Trunk Assessment Cervical / Trunk Assessment: Normal  Communication   Communication: No difficulties  Cognition Arousal/Alertness: Awake/alert Behavior During Therapy: WFL for tasks assessed/performed Overall Cognitive Status: Within Functional Limits for tasks assessed                                        General Comments      Exercises     Assessment/Plan    PT Assessment All further PT needs can be met in the next venue of care  PT Problem List Decreased strength;Decreased activity tolerance;Cardiopulmonary status limiting activity       PT Treatment Interventions      PT Goals (Current goals can be found in the Care Plan section)  Acute Rehab PT Goals Patient Stated Goal: go home PT Goal Formulation: All assessment and education complete, DC therapy    Frequency     Barriers to discharge        Co-evaluation               AM-PAC PT "6 Clicks" Mobility  Outcome Measure Help needed turning from your back to your side while in  a flat bed without using bedrails?: None Help needed moving from lying on your back to sitting on the side of a flat bed without using bedrails?: None Help needed moving to and from a bed to a chair (including a wheelchair)?: None Help needed standing up from a chair using your arms (e.g., wheelchair or bedside chair)?: None Help needed to walk in hospital room?: None Help needed climbing 3-5 steps with a railing? : A Little 6 Click Score: 23    End of Session   Activity Tolerance: Patient tolerated treatment well Patient left: in bed;with call bell/phone within reach;with family/visitor present Nurse Communication: Mobility status PT Visit Diagnosis: Muscle weakness (generalized) (M62.81)    Time: 1000-1015 PT Time Calculation (min) (ACUTE ONLY): 15  min   Charges:   PT Evaluation $PT Eval Low Complexity: 1 Low          Isabelle Course, PT, DPT  Corah Willeford 04/23/2018, 10:20 AM

## 2018-04-23 NOTE — Care Management Note (Signed)
Case Management Note Donn Pierini RN, BSN Transitions of Care Unit 4E- RN Case Manager [phone]  Patient Details  Name: Heather Day MRN: [id number] Date of Birth: 02/25/1986  Subjective/Objective:  Pt admitted s/p TVR                  Action/Plan: PTA pt lived at home, plan to return home, noted pt's discharge medications OTC and pain meds, pt will not need MATCH assistance. Ambulating well with cardiac rehab, plan to go home with family    Expected Discharge Date:  04/23/18               Expected Discharge Plan:  Home/Self Care  In-House Referral:  NA  Discharge planning Services  CM Consult  Post Acute Care Choice:  NA Choice offered to:  NA  DME Arranged:    DME Agency:     HH Arranged:    HH Agency:     Status of Service:  Completed, signed off  If discussed at Long Length of Stay Meetings, dates discussed:    Discharge Disposition: home/self care   Additional Comments:  Darrold Span, RN 04/23/2018, 11:48 AM

## 2018-04-23 NOTE — Progress Notes (Addendum)
[address]       [place] [address]             [phone]      5 Days Post-Op Procedure(s) (LRB): TRICUSPID VALVE REPAIR (N/A) TRANSESOPHAGEAL ECHOCARDIOGRAM (TEE) (N/A) Subjective: Feels pretty well, was on O2 , just removed this am  Objective: Vital signs in last 24 hours: Temp:  [97.4 F (36.3 C)-98.5 F (36.9 C)] 98.5 F (36.9 C) (02/11 0515) Pulse Rate:  [64-90] 64 (02/11 0515) Cardiac Rhythm: Normal sinus rhythm (02/11 0515) Resp:  [14-21] 14 (02/11 0515) BP: (104-113)/(59-67) 109/67 (02/11 0515) SpO2:  [97 %-98 %] 97 % (02/11 0515) Weight:  [148.5 kg] 148.5 kg (02/11 0515)  Hemodynamic parameters for last 24 hours:    Intake/Output from previous day: 02/10 0701 - 02/11 0700 In: 820 [P.O.:820] Out: 800 [Urine:800] Intake/Output this shift: No intake/output data recorded.  General appearance: alert, cooperative and no distress Heart: regular rate and rhythm, no rub and no murmur Lungs: clear to auscultation bilaterally Abdomen: benign Extremities: no significant edema Wound: incis healing well  Lab Results: Recent Labs    04/21/18 0441 04/22/18 0428  WBC 8.5 6.7  HGB 9.4* 8.7*  HCT 29.9* 27.8*  PLT 153 152   BMET:  Recent Labs    04/21/18 0441 04/22/18 0428  NA 139 139  K 3.8 4.1  CL 103 106  CO2 27 26  GLUCOSE 119* 110*  BUN 11 11  CREATININE 0.80 0.84  CALCIUM 8.3* 8.2*    PT/INR: No results for input(s): LABPROT, INR in the last 72 hours. ABG    Component Value Date/Time   PHART 7.322 (L) 04/18/2018 1455   HCO3 21.7 04/18/2018 1455   TCO2 23 04/18/2018 1455   ACIDBASEDEF 4.0 (H) 04/18/2018 1455   O2SAT 93.0 04/18/2018 1455   CBG (last 3)  Recent Labs    04/20/18 1448 04/20/18 1706  GLUCAP 123* 126*    Meds Scheduled Meds: . acetaminophen  1,000 mg Oral Q6H  . aspirin EC  325 mg Oral Daily  . bisacodyl  10 mg Oral Daily   Or  . bisacodyl  10 mg Rectal Daily  . docusate sodium  200 mg Oral  Daily  . enoxaparin (LOVENOX) injection  40 mg Subcutaneous QHS  . ferrous fumarate-b12-vitamic C-folic acid  1 capsule Oral TID PC  . methadone  85 mg Oral Daily  . pantoprazole  40 mg Oral Daily  . sodium chloride flush  3 mL Intravenous Q12H   Continuous Infusions: . sodium chloride     PRN Meds:.sodium chloride, alum & mag hydroxide-simeth, magnesium hydroxide, ondansetron (ZOFRAN) IV, oxyCODONE, sodium chloride, sodium chloride flush  Xrays Dg Chest 2 View  Result Date: 04/22/2018 CLINICAL DATA:  Follow-up chest surgery.  Chest pain and soreness. EXAM: CHEST - 2 VIEW COMPARISON:  04/21/2018 FINDINGS: Previous median sternotomy and mitral valve replacement. Right internal jugular central line tip in the SVC 3 cm above the right atrium. Slight improvement of bibasilar atelectasis. No worsening or new finding. IMPRESSION: Slowly improving bibasilar atelectasis. No worsening or new finding. Electronically Signed   By: Paulina Fusi M.D.   On: 04/22/2018 08:59    Assessment/Plan: S/P Procedure(s) (LRB): TRICUSPID VALVE REPAIR (N/A) TRANSESOPHAGEAL ECHOCARDIOGRAM (TEE) (N/A)  1  doing well, will see how she does ambulating with O2 off 2 she and mother  are worried about steps to her apartment- will have PT see if she is safe to  be on steps.  3 hemodyn stable in sinus rhythm 4 no new labs or xrays 5 will recheck later today for possible discharge   LOS: 5 days   Addendum_ did well on steps with PT Sats ok on RA with ambulation OK for discharge Will d/c omn daily 40 po lasix Rowe ClackWayne E Gold PA-C 04/23/2018 Pager [phone]

## 2018-04-23 NOTE — Plan of Care (Signed)
  Problem: Education: Goal: Knowledge of General Education information will improve Description Including pain rating scale, medication(s)/side effects and non-pharmacologic comfort measures Outcome: Progressing   Problem: Health Behavior/Discharge Planning: Goal: Ability to manage health-related needs will improve Outcome: Progressing   Problem: Clinical Measurements: Goal: Ability to maintain clinical measurements within normal limits will improve Outcome: Progressing Goal: Will remain free from infection Outcome: Progressing Goal: Diagnostic test results will improve Outcome: Progressing Goal: Respiratory complications will improve Outcome: Progressing Goal: Cardiovascular complication will be avoided Outcome: Progressing   Problem: Nutrition: Goal: Adequate nutrition will be maintained Outcome: Progressing   Problem: Elimination: Goal: Will not experience complications related to bowel motility Outcome: Progressing Goal: Will not experience complications related to urinary retention Outcome: Progressing   Problem: Pain Managment: Goal: General experience of comfort will improve Outcome: Progressing

## 2018-04-24 MED FILL — Sodium Chloride IV Soln 0.9%: INTRAVENOUS | Qty: 2000 | Status: AC

## 2018-04-24 MED FILL — Mannitol IV Soln 20%: INTRAVENOUS | Qty: 1000 | Status: AC

## 2018-04-24 MED FILL — Lidocaine HCl(Cardiac) IV PF Soln Pref Syr 100 MG/5ML (2%): INTRAVENOUS | Qty: 25 | Status: AC

## 2018-04-24 MED FILL — Sodium Bicarbonate IV Soln 8.4%: INTRAVENOUS | Qty: 50 | Status: AC

## 2018-04-24 MED FILL — Heparin Sodium (Porcine) Inj 1000 Unit/ML: INTRAMUSCULAR | Qty: 20 | Status: AC

## 2018-04-24 MED FILL — Electrolyte-R (PH 7.4) Solution: INTRAVENOUS | Qty: 3000 | Status: AC

## 2018-04-25 ENCOUNTER — Other Ambulatory Visit: Payer: Self-pay | Admitting: Cardiology

## 2018-04-25 ENCOUNTER — Telehealth: Payer: Self-pay | Admitting: *Deleted

## 2018-04-25 MED ORDER — OMEPRAZOLE 20 MG PO CPDR: 20.0000 mg | DELAYED_RELEASE_CAPSULE | Freq: Two times a day (BID) | ORAL | 0 refills | Status: DC

## 2018-04-25 MED ORDER — FUROSEMIDE 80 MG PO TABS: 40.0000 mg | ORAL_TABLET | Freq: Every day | ORAL | 0 refills | Status: DC

## 2018-04-25 MED FILL — FUROSEMIDE 40 MG TAB: 40 | 90 days supply | Qty: 90 | Fill #0

## 2018-04-25 MED FILL — OMEPRAZOLE 20 MG CPDR: 20 | 30 days supply | Qty: 60 | Fill #0

## 2018-04-25 NOTE — Telephone Encounter (Signed)
*  STAT* If patient is at the pharmacy, call can be transferred to refill team.   1. Which medications need to be refilled? (please list name of each medication and dose if known) Prilosec and Furosemide  2. Which pharmacy/location (including street and city if local pharmacy) is medication to be sent to?[facility]  3. Do they need a 30 day or 90 day supply? 30

## 2018-04-26 ENCOUNTER — Telehealth: Payer: Self-pay

## 2018-04-26 NOTE — Telephone Encounter (Signed)
Patient contacted the office concerned about a headache that she has had since discharge from the hospital.  She stated that she thought it was a "caffiene headache" but was unsure.  She stated that she took "3 pain pills" last night and that helped.  I did advised that anesthesia could cause headaches but should resolve or if she is on any new medications that a headache could be a side effect.  I did advised that if the headache persists, she needed to be evaluated by her PCP.  She asked if it was ok to take Excedrin, I advised that she could take it sparingly.  She acknowledged receipt.

## 2018-05-01 ENCOUNTER — Telehealth: Payer: Self-pay | Admitting: Cardiology

## 2018-05-01 MED ORDER — POTASSIUM CHLORIDE CRYS ER 10 MEQ PO TBCR: 10.0000 meq | EXTENDED_RELEASE_TABLET | Freq: Every day | ORAL | 0 refills | Status: DC

## 2018-05-01 NOTE — Telephone Encounter (Signed)
Potassium 10 mew daily refilled

## 2018-05-07 ENCOUNTER — Ambulatory Visit (INDEPENDENT_AMBULATORY_CARE_PROVIDER_SITE_OTHER): Payer: Self-pay | Admitting: Surgical

## 2018-05-07 VITALS — BP 90/60 | HR 74 | Resp 16 | Ht 68.0 in | Wt 325.8 lb

## 2018-05-07 DIAGNOSIS — Z4802 Encounter for removal of sutures: Secondary | ICD-10-CM

## 2018-05-07 DIAGNOSIS — I071 Rheumatic tricuspid insufficiency: Secondary | ICD-10-CM

## 2018-05-07 DIAGNOSIS — Z9889 Other specified postprocedural states: Secondary | ICD-10-CM

## 2018-05-07 DIAGNOSIS — T8131XA Disruption of external operation (surgical) wound, not elsewhere classified, initial encounter: Secondary | ICD-10-CM

## 2018-05-07 NOTE — Patient Instructions (Signed)
Normal saline wet-to-dry dressing changes of the chest tube site.  Also clean the distal sternotomy incision with peroxide.

## 2018-05-07 NOTE — Progress Notes (Signed)
Here for suture removal of one remaining chest tube site. This was easily removed. The other chest tube site is superficially slow to heal but with granulating tissue evident.  Her sternal incision is healing well except for the distal end which has a nondraining hole the size of the end of a Q-tip.  She complains of persistent headaches since surgery that she relates to caffeine withdrawal. She also says she has been light headed. Orthostatics were negative, although her BP is low at 100/70, pulse normal.  I have asked our PA to see her to address these issues.

## 2018-05-07 NOTE — Progress Notes (Signed)
[address]       [place] [address]             [phone]      RICHEL VIVENZIO [facility] Medical Record #[id number] Date of Birth: 12/15/1985  Referring: [name], MD Primary Care: Patient, No Pcp Per Primary Cardiologist: No primary care provider on file.   Chief Complaint:   POST OP FOLLOW UP  Date of Procedure:                04/18/2018  Preoperative Diagnosis:      Severe Tricuspid Regurgitation  Postoperative Diagnosis:    Same  Procedure:        Tricuspid Valve Repair             Complex valvuloplasty including autologous pericardial patch augmentation of anterior and posterior leaflets             Artificial Gore-tex neochord placement x4             Edwards mc3 ring annuloplasty (size 30 mm, model #4900, serial #[id number])  Surgeon:        Salvatore Decent. Cornelius Moras, MD  Assistant:       Ardelle Balls, PA-C  Anesthesia:    Karna Christmas, MD  Operative Findings:  Partially destroyed posterior leaflet c/w remote history of bacterial endocarditis  Type I and type II tricuspid valve dysfunction with severe tricuspid regurgitation  Mild residual tricuspid regurgitation after successful valve repair   History of Present Illness:   The patient is a 33 year old female seen in the office today for chest tube suture removal by the nurse.  She is status post the above described procedure.  She had some other complaints related to her recovery so the nurse asked me to see the patient.  She describes headaches since the time of surgery.  Additionally she has some dizziness at times.  Her Lasix was recently reduced by her cardiologist.  She is not orthostatic on exam.  The headaches she describes as primarily in the right temporal region and she does get some relief with nonsteroidal anti-inflammatories.  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  She has not missed any of these doses.  She also has some  concerns about her overall medical status including prediabetes and is concerned she may have a thyroid issue.  She states several members of her family have thyroid issues.      Past Medical History:  Diagnosis Date  . Anxiety    panic attacks  . Bipolar disorder (HCC)   . Drug abuse, IV (HCC)   . Dyspnea    when walking up stairs  . Gallstone   . GERD (gastroesophageal reflux disease)   . Heartburn   . Hepatitis C   . Periodontitis chronic, apical    dental caries  . PTSD (post-traumatic stress disorder)   . 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 Severe tricuspid regurgitation   . Wears glasses      Social History   Tobacco Use  Smoking Status Former Smoker  . Packs/day: 1.00  . Years: 20.00  . Pack years: 20.00  . Types: Cigarettes  . Last attempt to quit: 04/26/2017  . Years since quitting: 1.0  Smokeless Tobacco Never Used    Social  History   Substance and Sexual Activity  Alcohol Use No     No Known Allergies  Current Outpatient Medications  Medication Sig Dispense Refill  . aspirin EC 325 MG EC tablet Take 1 tablet (325 mg total) by mouth daily. 30 tablet 0  . ferrous fumarate-b12-vitamic C-folic acid (TRINSICON / FOLTRIN) capsule Take 1 capsule by mouth 2 (two) times daily with a meal. 60 capsule 1  . furosemide (LASIX) 40 MG tablet TAKE 1 TABLET (40 MG TOTAL) BY MOUTH DAILY. 90 tablet 0  . methadone (DOLOPHINE) 10 MG/5ML solution Take 85 mg by mouth daily.     Marland Kitchen omeprazole (PRILOSEC) 20 MG capsule TAKE 1 CAPSULE (20 MG TOTAL) BY MOUTH 2 (TWO) TIMES DAILY BEFORE A MEAL. 60 capsule 11  . potassium chloride (K-DUR,KLOR-CON) 10 MEQ tablet Take 1 tablet (10 mEq total) by mouth daily. 30 tablet 0   No current facility-administered medications for this visit.        Physical Exam: BP  90/60 (BP Location: Left Arm, Patient Position: Supine, Cuff Size: Large)   Pulse 74   Resp 16   Ht 5\' 8"  (1.727 m)   Wt (!) 147.8 kg   SpO2 96% Comment: ON RA  BMI 49.54 kg/m   General appearance: alert, cooperative and no distress Heart: regular rate and rhythm, no rub and No murmur Lungs: clear to auscultation bilaterally Abdomen: benign Extremities: no edema Wound: The distal portion of the sternal incision has a superficial 1 cm skin dehiscence.  It does not track deeply.  There is no associated drainage or cellulitis.  Both chest tube sites have superficial skin dehiscence with granulation tissue in the base.  There is no purulence or cellulitis.   Diagnostic Studies & Laboratory data:     Recent Radiology Findings:   No results found.    Recent Lab Findings: Lab Results  Component Value Date   WBC 6.7 04/22/2018   HGB 8.7 (L) 04/22/2018   HCT 27.8 (L) 04/22/2018   PLT 152 04/22/2018   GLUCOSE 110 (H) 04/22/2018   CHOL 214 (H) 02/26/2017   TRIG 184 (H) 02/26/2017   HDL 45 02/26/2017   LDLCALC 132 (H) 02/26/2017   ALT 16 04/15/2018   AST 21 04/15/2018   NA 139 04/22/2018   K 4.1 04/22/2018   CL 106 04/22/2018   CREATININE 0.84 04/22/2018   BUN 11 04/22/2018   CO2 26 04/22/2018   INR 1.35 04/18/2018   HGBA1C 5.8 (H) 04/15/2018      Assessment / Plan: Superficial wound dehiscence without evidence of current infection.  She will do daily dressing changes as instructed.  As to her medical concerns we discussed the need for a primary care physician for long-term management of her diabetes and potential thyroid issues as she is quite obese.  She understands this and will attempt to arrange follow-up.  Unfortunately, she does not have any type of insurance and currently does not qualify for Medicaid.  Did give her some suggestions as to community clinics.  She is scheduled seen in the office by Dr. Cornelius Moras next week.          Rowe Clack, PA-C 05/07/2018 10:16  AM

## 2018-05-13 ENCOUNTER — Ambulatory Visit
Admission: RE | Admit: 2018-05-13 | Discharge: 2018-05-13 | Disposition: A | Discharge status: Home / Self Care | Payer: Self-pay | Source: Ambulatory Visit | Attending: Thoracic Surgery (Cardiothoracic Vascular Surgery) | Admitting: Thoracic Surgery (Cardiothoracic Vascular Surgery)

## 2018-05-13 ENCOUNTER — Ambulatory Visit (INDEPENDENT_AMBULATORY_CARE_PROVIDER_SITE_OTHER): Payer: Self-pay | Admitting: Physician Assistant

## 2018-05-13 ENCOUNTER — Ambulatory Visit
Admission: RE | Admit: 2018-05-13 | Discharge: 2018-05-13 | Disposition: A | Discharge status: Home / Self Care | Payer: Self-pay | Source: Ambulatory Visit | Attending: Physician Assistant | Admitting: Physician Assistant

## 2018-05-13 ENCOUNTER — Other Ambulatory Visit: Payer: Self-pay | Admitting: *Deleted

## 2018-05-13 ENCOUNTER — Other Ambulatory Visit: Payer: Self-pay

## 2018-05-13 VITALS — BP 108/78 | HR 110 | Temp 98.0°F | Resp 18 | Ht 68.0 in | Wt 325.0 lb

## 2018-05-13 DIAGNOSIS — Z9889 Other specified postprocedural states: Secondary | ICD-10-CM

## 2018-05-13 DIAGNOSIS — J9851 Mediastinitis: Secondary | ICD-10-CM

## 2018-05-13 DIAGNOSIS — Z4889 Encounter for other specified surgical aftercare: Secondary | ICD-10-CM

## 2018-05-13 DIAGNOSIS — T8149XA Infection following a procedure, other surgical site, initial encounter: Secondary | ICD-10-CM

## 2018-05-13 DIAGNOSIS — T8132XA Disruption of internal operation (surgical) wound, not elsewhere classified, initial encounter: Secondary | ICD-10-CM | POA: Diagnosis present

## 2018-05-13 MED ORDER — IOPAMIDOL (ISOVUE-300) INJECTION 61%
75.0000 mL | Freq: Once | INTRAVENOUS | Status: AC | PRN
  Administered 2018-05-13: 75 mL via INTRAVENOUS

## 2018-05-13 NOTE — Progress Notes (Signed)
[address]       [place] [address]             [phone]      Heather Day is a 33 y.o. female patient who underwent a tricuspid valve repair on 04/18/2018 with Dr. Cornelius Day.  She has a past medical history significant for morbid obesity, history of IV drug abuse, severe tricuspid valve regurgitation status post tricuspid valve repair, bacterial endocarditis, cholelithiasis and hepatitis C who is seen in the office today for a wound check.  She was seen by Heather Day in the office on May 07, 2018.  At that time she had a superficial wound dehiscence without evidence of current infection.  She was instructed to perform daily dressing changes.  Today she is following up with [place] on her wound progression.  No diagnosis found. Past Medical History:  Diagnosis Date  . Anxiety    panic attacks  . Bipolar disorder (HCC)   . Drug abuse, IV (HCC)   . Dyspnea    when walking up stairs  . Gallstone   . GERD (gastroesophageal reflux disease)   . Heartburn   . Hepatitis C   . Periodontitis chronic, apical    dental caries  . PTSD (post-traumatic stress disorder)   . 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 Severe tricuspid regurgitation   . Wears glasses    No past surgical history pertinent negatives on file. Scheduled Meds: Current Outpatient Medications on File Prior to Visit  Medication Sig Dispense Refill  . aspirin EC 325 MG EC tablet Take 1 tablet (325 mg total) by mouth daily. 30 tablet 0  . ferrous fumarate-b12-vitamic C-folic acid (TRINSICON / FOLTRIN) capsule Take 1 capsule by mouth 2 (two) times daily with a meal. 60 capsule 1  . furosemide (LASIX) 40 MG tablet TAKE 1 TABLET (40 MG TOTAL) BY MOUTH DAILY. 90 tablet 0  . methadone (DOLOPHINE) 10 MG/5ML solution Take 85 mg by mouth daily.      Marland Kitchen omeprazole (PRILOSEC) 20 MG capsule TAKE 1 CAPSULE (20 MG TOTAL) BY MOUTH 2 (TWO) TIMES DAILY BEFORE A MEAL. 60 capsule 11  . potassium chloride (K-DUR,KLOR-CON) 10 MEQ tablet Take 1 tablet (10 mEq total) by mouth daily. 30 tablet 0   No current facility-administered medications on file prior to visit.    There were no vitals taken for this visit.  Subjective  Heather Day presents today for her sternal incision check.   Objective   Wound: 2cm x 2cm open wound with some purulent drainage. Area of erythema at the bottom of the sternum incision extending 4 cm with a few pin-sized hold draining more fluid. Tracks only 1.5cm superiorly but I think the wound dehiscence continues further.      Assessment & Plan   Her sternotomy incision has not been healing well over the last week since she was last seen in the office on 05/07/2018.  The bottom portion of the incision is now open and draining.  She has another area that has erythema and is fluctuant to the touch.  In order for the superficial wound dehiscence to heal properly she will need further opening of the sternotomy incision and placement of wound VAC.  Dr. Cornelius Day examined the patient and has set her up for surgery on Thursday, March 5.  She will need some preoperative testing done including a chest CT with contrast and blood cultures. Instructions given on daily dressing changes. No antibiotics at this time.     Heather Day 05/13/2018

## 2018-05-13 NOTE — Patient Instructions (Signed)
Please report to [facility] either Tuesday or Wednesday this week for your preoperative testing.  A chest CT scan with contrast has been ordered for you to complete before your surgery date.  Please continue daily dressing changes and update the office if there are any dramatic changes.  We will plan to see Heather Day in the OR Thursday morning for a wound debridement and placement of wound VAC to aid her sternotomy incision and healing.

## 2018-05-13 NOTE — Progress Notes (Signed)
Patient contacted the office today with concerns of potential sternal wound infection.  Patient is s/p Tricuspid Valve repair with Dr. Cornelius Moras 04/18/2018.  States the site is red, warm, temperature is currently 99, and draining a tan color.  Advised patient to come into the office today at 2:30p to be evaluated by a PA in the office.  She acknowledged receipt.

## 2018-05-14 ENCOUNTER — Other Ambulatory Visit: Payer: Self-pay | Admitting: *Deleted

## 2018-05-14 NOTE — Pre-Procedure Instructions (Signed)
Heather Day  05/14/2018      [facility] - [place], [place] - [address] [place] [address] Phone: [phone] Fax: [phone]  [facility] - [place], [place] - [address] [place] [address] Phone: [phone] Fax: [phone]    Your procedure is scheduled on May 16, 2018.  Report to [facility] at 530 AM.  Call this number if you have problems the morning of surgery:  [phone]   Remember:  Do not eat or drink after midnight.    Take these medicines the morning of surgery with A SIP OF WATER  Methadone (Dolophine) Omeprazole (prilosec)  Follow your surgeon's instructions on when to hold/resume aspirin.  If no instructions were given call the office to determine how they would like to you take aspirin   Beginning now, STOP taking any Aleve, Naproxen, Ibuprofen, Motrin, Advil, Goody's, BC's, all herbal medications, fish oil, and all vitamins    Do not wear jewelry, make-up or nail polish.  Do not wear lotions, powders, or perfumes, or deodorant.  Do not shave 48 hours prior to surgery.    Do not bring valuables to the hospital.  [facility] is not responsible for any belongings or valuables.  Contacts, dentures or bridgework may not be worn into surgery.  Leave your suitcase in the car.  After surgery it may be brought to your room.  For patients admitted to the hospital, discharge time will be determined by your treatment team.  Patients discharged the day of surgery will not be allowed to drive home.    [facility]- Preparing For Surgery  Before surgery, you can play an important role. Because skin is not sterile, your skin needs to be as free of germs as possible. You can reduce the number of germs on your skin by washing with CHG (chlorahexidine gluconate) Soap before surgery.  CHG is an antiseptic cleaner which kills germs and  bonds with the skin to continue killing germs even after washing.    Oral Hygiene is also important to reduce your risk of infection.  Remember - BRUSH YOUR TEETH THE MORNING OF SURGERY WITH YOUR REGULAR TOOTHPASTE  Please do not use if you have an allergy to CHG or antibacterial soaps. If your skin becomes reddened/irritated stop using the CHG.  Do not shave (including legs and underarms) for at least 48 hours prior to first CHG shower. It is OK to shave your face.  Please follow these instructions carefully.   1. Shower the NIGHT BEFORE SURGERY and the MORNING OF SURGERY with CHG.   2. If you chose to wash your hair, wash your hair first as usual with your normal shampoo.  3. After you shampoo, rinse your hair and body thoroughly to remove the shampoo.  4. Use CHG as you would any other liquid soap. You can apply CHG directly to the skin and wash gently with a scrungie or a clean washcloth.   5. Apply the CHG Soap to your body ONLY FROM THE NECK DOWN.  Do not use on open wounds or open sores. Avoid contact with your eyes, ears, mouth and genitals (private parts). Wash Face and genitals (private parts)  with your normal soap.  6. Wash thoroughly, paying special attention to the area where your surgery will be performed.  7. Thoroughly rinse your body with warm water from the  neck down.  8. DO NOT shower/wash with your normal soap after using and rinsing off the CHG Soap.  9. Pat yourself dry with a CLEAN TOWEL.  10. Wear CLEAN PAJAMAS to bed the night before surgery, wear comfortable clothes the morning of surgery  11. Place CLEAN SHEETS on your bed the night of your first shower and DO NOT SLEEP WITH PETS.  Day of Surgery:  Do not apply any deodorants/lotions.  Please wear clean clothes to the hospital/surgery center.   Remember to brush your teeth WITH YOUR REGULAR TOOTHPASTE.   Please read over the following fact sheets that you were given.

## 2018-05-15 ENCOUNTER — Other Ambulatory Visit: Payer: Self-pay

## 2018-05-15 ENCOUNTER — Encounter (HOSPITAL_COMMUNITY): Payer: Self-pay | Admitting: Physician Assistant

## 2018-05-15 ENCOUNTER — Ambulatory Visit (INDEPENDENT_AMBULATORY_CARE_PROVIDER_SITE_OTHER): Payer: Self-pay | Admitting: Physician Assistant

## 2018-05-15 ENCOUNTER — Encounter (HOSPITAL_COMMUNITY): Payer: Self-pay

## 2018-05-15 ENCOUNTER — Encounter (HOSPITAL_COMMUNITY)
Admission: RE | Admit: 2018-05-15 | Discharge: 2018-05-15 | Disposition: A | Discharge status: Home / Self Care | Payer: Self-pay | Source: Ambulatory Visit | Attending: Thoracic Surgery (Cardiothoracic Vascular Surgery) | Admitting: Thoracic Surgery (Cardiothoracic Vascular Surgery)

## 2018-05-15 VITALS — BP 118/68 | HR 110 | Temp 98.8°F | Resp 16 | Ht 68.0 in | Wt 328.6 lb

## 2018-05-15 DIAGNOSIS — Z79899 Other long term (current) drug therapy: Secondary | ICD-10-CM | POA: Insufficient documentation

## 2018-05-15 DIAGNOSIS — F319 Bipolar disorder, unspecified: Secondary | ICD-10-CM | POA: Insufficient documentation

## 2018-05-15 DIAGNOSIS — Z9049 Acquired absence of other specified parts of digestive tract: Secondary | ICD-10-CM | POA: Insufficient documentation

## 2018-05-15 DIAGNOSIS — F410 Panic disorder [episodic paroxysmal anxiety] without agoraphobia: Secondary | ICD-10-CM | POA: Insufficient documentation

## 2018-05-15 DIAGNOSIS — F4310 Post-traumatic stress disorder, unspecified: Secondary | ICD-10-CM | POA: Insufficient documentation

## 2018-05-15 DIAGNOSIS — T8131XD Disruption of external operation (surgical) wound, not elsewhere classified, subsequent encounter: Secondary | ICD-10-CM

## 2018-05-15 DIAGNOSIS — Z01812 Encounter for preprocedural laboratory examination: Secondary | ICD-10-CM | POA: Insufficient documentation

## 2018-05-15 DIAGNOSIS — I491 Atrial premature depolarization: Secondary | ICD-10-CM | POA: Insufficient documentation

## 2018-05-15 DIAGNOSIS — Z7982 Long term (current) use of aspirin: Secondary | ICD-10-CM | POA: Insufficient documentation

## 2018-05-15 DIAGNOSIS — Z87891 Personal history of nicotine dependence: Secondary | ICD-10-CM | POA: Insufficient documentation

## 2018-05-15 DIAGNOSIS — K219 Gastro-esophageal reflux disease without esophagitis: Secondary | ICD-10-CM | POA: Insufficient documentation

## 2018-05-15 DIAGNOSIS — Z9889 Other specified postprocedural states: Secondary | ICD-10-CM

## 2018-05-15 DIAGNOSIS — I4510 Unspecified right bundle-branch block: Secondary | ICD-10-CM | POA: Insufficient documentation

## 2018-05-15 DIAGNOSIS — T8149XA Infection following a procedure, other surgical site, initial encounter: Secondary | ICD-10-CM | POA: Insufficient documentation

## 2018-05-15 DIAGNOSIS — Z952 Presence of prosthetic heart valve: Secondary | ICD-10-CM | POA: Insufficient documentation

## 2018-05-15 DIAGNOSIS — Z4889 Encounter for other specified surgical aftercare: Secondary | ICD-10-CM

## 2018-05-15 HISTORY — DX: Prediabetes: R73.03

## 2018-05-15 LAB — TYPE AND SCREEN
ABO/RH(D): A POS
Antibody Screen: NEGATIVE

## 2018-05-15 LAB — CBC
HCT: 32.7 % — ABNORMAL LOW (ref 36.0–46.0)
Hemoglobin: 10.5 g/dL — ABNORMAL LOW (ref 12.0–15.0)
MCH: 29.1 pg (ref 26.0–34.0)
MCHC: 32.1 g/dL (ref 30.0–36.0)
MCV: 90.6 fL (ref 80.0–100.0)
Platelets: 258 10*3/uL (ref 150–400)
RBC: 3.61 MIL/uL — ABNORMAL LOW (ref 3.87–5.11)
RDW: 12.4 % (ref 11.5–15.5)
WBC: 9 10*3/uL (ref 4.0–10.5)
nRBC: 0 % (ref 0.0–0.2)

## 2018-05-15 LAB — COMPREHENSIVE METABOLIC PANEL
ALT: 14 U/L (ref 0–44)
AST: 20 U/L (ref 15–41)
Albumin: 3.1 g/dL — ABNORMAL LOW (ref 3.5–5.0)
Alkaline Phosphatase: 72 U/L (ref 38–126)
Anion gap: 9 (ref 5–15)
BUN: 8 mg/dL (ref 6–20)
CO2: 24 mmol/L (ref 22–32)
Calcium: 8.8 mg/dL — ABNORMAL LOW (ref 8.9–10.3)
Chloride: 103 mmol/L (ref 98–111)
Creatinine, Ser: 0.76 mg/dL (ref 0.44–1.00)
GFR calc Af Amer: >60 mL/min (ref >60)
GFR calc non Af Amer: >60 mL/min (ref >60)
Glucose, Bld: 89 mg/dL (ref 70–99)
Potassium: 4 mmol/L (ref 3.5–5.1)
SODIUM: 136 mmol/L (ref 135–145)
Total Bilirubin: 0.3 mg/dL (ref 0.3–1.2)
Total Protein: 6.8 g/dL (ref 6.5–8.1)

## 2018-05-15 LAB — SURGICAL PCR SCREEN
MRSA, PCR: NEGATIVE
Staphylococcus aureus: POSITIVE — AB

## 2018-05-15 MED ORDER — DEXTROSE 5 % IV SOLN
3.0000 g | INTRAVENOUS | Status: AC
  Administered 2018-05-16: 3 g via INTRAVENOUS
  Filled 2018-05-15: qty 3

## 2018-05-15 NOTE — Anesthesia Preprocedure Evaluation (Addendum)
Anesthesia Evaluation  Patient identified by MRN, date of birth, ID band Patient awake    Reviewed: Allergy & Precautions, NPO status , Patient's Chart, lab work & pertinent test results  Airway Mallampati: I  TM Distance: >3 FB Neck ROM: Full    Dental   Pulmonary former smoker,    Pulmonary exam normal        Cardiovascular Normal cardiovascular exam     Neuro/Psych Anxiety Bipolar Disorder    GI/Hepatic GERD  Medicated and Controlled,(+) Hepatitis -, C  Endo/Other    Renal/GU      Musculoskeletal   Abdominal   Peds  Hematology   Anesthesia Other Findings   Reproductive/Obstetrics                            Anesthesia Physical Anesthesia Plan  ASA: III  Anesthesia Plan: MAC   Post-op Pain Management:    Induction: Intravenous  PONV Risk Score and Plan: 3 and Ondansetron, Midazolam and Treatment may vary due to age or medical condition  Airway Management Planned: Oral ETT  Additional Equipment: Arterial line and CVP  Intra-op Plan:   Post-operative Plan: Post-operative intubation/ventilation  Informed Consent: I have reviewed the patients History and Physical, chart, labs and discussed the procedure including the risks, benefits and alternatives for the proposed anesthesia with the patient or authorized representative who has indicated his/her understanding and acceptance.       Plan Discussed with: CRNA and Surgeon  Anesthesia Plan Comments: (PAT note written 05/15/2018 by Myra Gianotti, PA-C. )      Anesthesia Quick Evaluation

## 2018-05-15 NOTE — Progress Notes (Signed)
PCP -  Cardiologist - Townsend Roger  Chest x-ray - 05/13/2018 EKG - 04/2018 Stress Test -  ECHO - 04/2018 Cardiac Cath -   Sleep Study - n/a CPAP -   Fasting Blood Sugar - pt. Reports that she is prediabetic Checks Blood Sugar _____ times a day  Blood Thinner Instructions: Aspirin Instructions:325mg .daily, will not take tomorrow- DOS  Anesthesia review: Spoke with A. Zelenak,PA-C, she has reviewed the chart.   Patient denies shortness of breath, fever, cough and chest pain at PAT appointment   Patient verbalized understanding of instructions that were given to them at the PAT appointment. Patient was also instructed that they will need to review over the PAT instructions again at home before surgery.

## 2018-05-15 NOTE — Patient Instructions (Signed)
Continue to observe sternal precautions.  Reinforce dressing as needed. Dr Cornelius Moras will proceed with debridement in the operating room tomorrow.

## 2018-05-15 NOTE — Progress Notes (Signed)
Anesthesia Chart Review:  Case:  [id number] Date/Time:  05/16/18 0715   Procedures:      STERNAL WOUND DEBRIDEMENT (N/A )     APPLICATION OF WOUND VAC (N/A )   Anesthesia type:  Monitor Anesthesia Care   Pre-op diagnosis:  STERNAL WOUND INFECTION   [facility]:  [facility] ROOM 15 / [facility]   Surgeon:  Purcell Nails, MD      DISCUSSION: Patient is a 33 year old female scheduled for the above procedure. She is s/p TV repair on 04/18/18.  History includes severe TR (s/p TV complex valvuloplasty, artifical Gore-Tex neochord x4, Edwards size 30 mm annuloplasty ring 04/18/18), IV drug use (x15years; clean since 10/08/16) with endocarditis (~ 2016/2017), Hepatitis C, PTSD, former smoker (quit 04/26/17). 04/01/18 coronary CT showed no evidence of CAD. She is s/p appendectomy 10/03/17, teeth extractions 11/29/17, and cholecystectomy 01/15/18. She moved to [facility] from [place] in 2018. BMI is consistent with morbid obesity.  Blood cultures drawn with PAT labs per surgeon orders. Results are in process.  Anesthesia team to evaluate on the day of surgery.   VS: BP 129/64   Pulse (!) 103   Temp 36.9 C   Resp 20   Ht 5\' 8"  (1.727 m)   Wt (!) 149 kg   LMP 04/13/2018   SpO2 94%   BMI 49.93 kg/m  VS show mild tachycardia which is consistent with last few [facility] office visits.     PROVIDERS: Patient, No Pcp Per - [name], MD is cardiologist.  LABS: Labs reviewed: Acceptable for surgery. (all labs ordered are listed, but only abnormal results are displayed)  Labs Reviewed  CBC - Abnormal; Notable for the following components:      Result Value   RBC 3.61 (*)    Hemoglobin 10.5 (*)    HCT 32.7 (*)    All other components within normal limits  COMPREHENSIVE METABOLIC PANEL - Abnormal; Notable for the following components:   Calcium 8.8 (*)    Albumin 3.1 (*)    All other components within normal limits  CULTURE, BLOOD (ROUTINE X 2)  CULTURE, BLOOD (ROUTINE X 2)  SURGICAL PCR SCREEN  TYPE AND  SCREEN    PFTs 04/15/18: FVC 4.34 (102%), FEV1 3.57 (101%), DLCO unc 20.89 (70%).   IMAGES: CXR 05/13/18:  IMPRESSION: No active cardiopulmonary disease. Minimal left basilar atelectasis.  CT Chest 05/13/18: IMPRESSION: 1. Mild nonspecific stranding within the anterior mediastinum, potentially postsurgical. No convincing evidence of mediastinitis. No fluid collection or mediastinal air. 2. Intact median sternotomy without osseous destruction or periosteal reaction to suggest osteomyelitis. 3. Small area of superficial dehiscence along the inferior sternal incision with mild midline presternal soft tissue stranding. No soft tissue abscess.   EKG: 04/19/18:  Normal sinus rhythm Right bundle branch block T wave abnormality, consider lateral ischemia QT/QTc 432/492 ms Abnormal ECG No significant change since last tracing Confirmed by Verdis Prime [phone]) on 04/19/2018 1:03:06 PM   CV: TEE (Intraoperative TEE with TVR) 04/18/18: PRE-TV Repair:  Septum: No Patent Foramen Ovale present.  Left atrium: Patent foramen ovale not present.  Right ventricle: Normal ejection fraction. Cavity is mildly to moderately dilated. There is mild hypertrophy.  Right atrium: Cavity is moderately to severely dilated. Interatrial septum bows to the left, consistent with elevated right atrial pressure.  Tricuspid valve: Severe regurgitation. The tricuspid valve regurgitation jet is directed toward the right atrial free wall.  Tricuspid valve: Mild regurgitation. POST-TV Repair: AORTA: Aorta unchanged from pre-bypass.  LEFT VENTRICLE: Left ventricle unchanged from pre-bypass.  RIGHT VENTRICLE: Right ventricle unchanged from pre-bypass.  AORTIC VALVE: Aortic valve unchanged from pre-bypass MITRAL VALVE: Mitral valve unchanged from pre-bypass.  TRICUSPID VALVE: Mild regurgitation. Annular ring is present in post repair imaging.  Carotid U/S 04/15/18: Summary: Right Carotid: Velocities in the right  ICA are consistent with a 1-39% stenosis. Left Carotid: Velocities in the left ICA are consistent with a 1-39% stenosis. Vertebrals: Bilateral vertebral arteries demonstrate antegrade flow.  CT coronary 04/01/18: IMPRESSION: 1. Coronary calcium score of 0. This was 0 percentile for age and sex matched control. 2. Normal coronary origin with right dominance. 3. No evidence of CAD. 4. Mildly dilated pulmonary artery measuring 31 mm suggestive of pulmonary hypertension. 5. Severely dilated right atrium.  48 hour Holter monitor 09/24/17: Baseline rhythm: Normal sinus rhythm Minimum heart rate: 56 BPM. Average heart rate: 85BPM. Maximal heart rate 149 BPM. Atrial arrhythmia: 2 premature supraventricular beats Ventricular arrhythmia: 1 premature ventricular beat Conduction abnormality: None Symptoms: None Conclusion: Normal Holter monitor.   Past Medical History:  Diagnosis Date  . Anxiety    panic attacks  . Bipolar disorder (HCC)   . Drug abuse, IV (HCC)   . Dyspnea    when walking up stairs  . Gallstone   . GERD (gastroesophageal reflux disease)   . Heartburn   . Hepatitis C   . Periodontitis chronic, apical    dental caries  . PTSD (post-traumatic stress disorder)   . 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 Severe tricuspid regurgitation   . Wears glasses     Past Surgical History:  Procedure Laterality Date  . APPENDECTOMY    . CESAREAN SECTION    . CHOLECYSTECTOMY N/A 01/15/2018   Procedure: LAPAROSCOPIC CHOLECYSTECTOMY ERAS PATHWAY;  Surgeon: Glenna FellowsHoxworth, Benjamin, MD;  Location: [facility];  Service: General;  Laterality: N/A;  . INTRAOPERATIVE CHOLANGIOGRAM N/A 01/15/2018   Procedure: INTRAOPERATIVE CHOLANGIOGRAM;  Surgeon: Glenna FellowsHoxworth, Benjamin, MD;  Location: [facility];  Service: General;  Laterality:  N/A;  . LAPAROSCOPIC APPENDECTOMY N/A 07/04/2017   Procedure: APPENDECTOMY LAPAROSCOPIC;  Surgeon: Luretha MurphyMartin, Matthew, MD;  Location: [facility]S;  Service: General;  Laterality: N/A;  . MULTIPLE EXTRACTIONS WITH ALVEOLOPLASTY Bilateral 11/29/2017   Procedure: Extraction of tooth #'s 2,5-15, and 31 with alveoloplasty and gross debridement of remaining dentition;  Surgeon: Charlynne PanderKulinski, Ronald F, DDS;  Location: [facility];  Service: Oral Surgery;  Laterality: Bilateral;  . TEE WITHOUT CARDIOVERSION N/A 10/03/2017   Procedure: TRANSESOPHAGEAL ECHOCARDIOGRAM (TEE);  Surgeon: Jodelle Redhristopher, Bridgette, MD;  [facility];  Service: Cardiovascular;  Laterality: N/A;  . TEE WITHOUT CARDIOVERSION N/A 04/18/2018   Procedure: TRANSESOPHAGEAL ECHOCARDIOGRAM (TEE);  Surgeon: Purcell Nailswen, Clarence H, MD;  [facility];  Service: Open Heart Surgery;  Laterality: N/A;  . TRICUSPID VALVE REPLACEMENT N/A 04/18/2018   Procedure: TRICUSPID VALVE REPAIR;  Surgeon: Purcell Nailswen, Clarence H, MD;  [facility];  Service: Open Heart Surgery;  Laterality: N/A;    MEDICATIONS: . aspirin EC 325 MG EC tablet  . ferrous fumarate-b12-vitamic C-folic acid (TRINSICON / FOLTRIN) capsule  . ferrous sulfate 325 (65 FE) MG tablet  . furosemide (LASIX) 40 MG tablet  . methadone (DOLOPHINE) 10 MG/5ML solution  . Multiple Vitamin (MULTIVITAMIN WITH MINERALS) TABS tablet  . omeprazole (PRILOSEC) 20 MG capsule  . potassium chloride (K-DUR,KLOR-CON) 10 MEQ tablet   No  current facility-administered medications for this encounter.     Shonna Chock, PA-C Surgical Short Stay/Anesthesiology [facility] Phone [phone] [facility] Phone [phone] 05/15/2018 3:49 PM

## 2018-05-15 NOTE — H&P (View-Only) (Signed)
HPI: Heather Day is a 33 y.o. 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 She underwent a tricuspid valve repair on 04/18/2018 with Dr. Cornelius Day. She was seen by Heather Day in the office on May 07, 2018.  At that time she had a superficial wound dehiscence without evidence of current infection.  She was instructed to perform daily dressing changes.     She returned to the office for scheduled followup for a wound check on 05/13/2018 and was found to have developed superficial dehiscence of the wound. She was evaluated by Heather Day and Dr. Cornelius Day and the decision was made to schedule debridement in the OR and placement of a wound vac on 05/16/18 (tomorrow). A CT scan of the chest was obtained after the office visit on 05/13/18 that demonstrated the soft tissue separation but showed no evidence of bony destruction or mediastinal fluid collection to suggest mediastinitis. She called the office earlier today reporting that the wound had separated further and we asked her to come in for evaluation. She denies fever.    Current Outpatient Medications  Medication Sig Dispense Refill  . aspirin EC 325 MG EC tablet Take 1 tablet (325 mg total) by mouth daily. 30 tablet 0  . ferrous fumarate-b12-vitamic C-folic acid (TRINSICON / FOLTRIN) capsule Take 1 capsule by mouth 2 (two) times daily with a meal. (Patient not taking: Reported on 05/14/2018) 60 capsule 1  . ferrous sulfate 325 (65 FE) MG tablet Take 325 mg by mouth daily with breakfast.    . furosemide (LASIX) 40 MG tablet TAKE 1 TABLET (40 MG TOTAL) BY MOUTH DAILY. 90 tablet 0  . methadone (DOLOPHINE) 10 MG/5ML solution Take 85 mg by mouth daily.     . Multiple Vitamin (MULTIVITAMIN WITH MINERALS) TABS tablet Take 1 tablet by mouth daily.    Marland Kitchen omeprazole (PRILOSEC) 20 MG  capsule TAKE 1 CAPSULE (20 MG TOTAL) BY MOUTH 2 (TWO) TIMES DAILY BEFORE A MEAL. (Patient taking differently: Take 20 mg by mouth daily. ) 60 capsule 11  . potassium chloride (K-DUR,KLOR-CON) 10 MEQ tablet Take 1 tablet (10 mEq total) by mouth daily. 30 tablet 0   No current facility-administered medications for this visit.     Physical Exam: VS: BP 118/68        HR 110        RR 16        Temp 98.8        SpO2 97%  Heart: Mildly tachycardic Chest: Dressing was removed exposing separation of the lower 4 to 5 cm of the sternal incision.  A blue monofilament suture is crisscrossing the wound but the tissues below the level of the stitch are separating.  Stitch was removed to facilitate packing.  There was no purulence.  The surrounding tissues appear healthy.  Sternum was stable.   Diagnostic Tests:  EXAM: CT CHEST WITH CONTRAST  TECHNIQUE: Multidetector CT imaging of the chest was performed during intravenous contrast administration.  CONTRAST:  2mL ISOVUE-300 IOPAMIDOL (ISOVUE-300) INJECTION 61%  COMPARISON:  Cardiac CT dated April 01, 2018.  FINDINGS: Cardiovascular: Mild cardiomegaly. Prior tricuspid valve replacement. No pericardial effusion. No thoracic aortic aneurysm or dissection. No central pulmonary embolism.  Mediastinum/Nodes: Nonspecific stranding within the anterior mediastinum. No enlarged mediastinal, hilar, or axillary lymph nodes. Thyroid gland, trachea, and esophagus demonstrate no significant  findings.  Lungs/Pleura: Subsegmental atelectasis/scarring in both lower lobes. No focal consolidation, pleural effusion, or pneumothorax. No suspicious pulmonary nodule.  Upper Abdomen: No acute abnormality.  Prior cholecystectomy.  Musculoskeletal: Prior median sternotomy. No osseous destruction or periosteal reaction. Small area of superficial dehiscence along the inferior sternal incision. Mild midline presternal soft tissue stranding. No fluid  collection.  IMPRESSION: 1. Mild nonspecific stranding within the anterior mediastinum, potentially postsurgical. No convincing evidence of mediastinitis. No fluid collection or mediastinal air. 2. Intact median sternotomy without osseous destruction or periosteal reaction to suggest osteomyelitis. 3. Small area of superficial dehiscence along the inferior sternal incision with mild midline presternal soft tissue stranding. No soft tissue abscess.   Electronically Signed   By: Obie Dredge M.D.   On: 05/13/2018 17:11  Impression / Plan: Superficial sternal wound dehiscence in a 33 year old female who is one-month status post tricuspid valve repair for severe tricuspid insufficiency related to endocarditis.  The wound measures approximately 5x5 cm and is about 3cm deep. It is clean, without purulence and has only minimal surrounding erythema. The sternum is stable.  The wound was carefully cleaned and repacked in the office with a saline-moistened dressing.  Heather Day is already scheduled for sternal debridement, exploration, and placement of wound VAC in the operating room by Dr. Cornelius Day tomorrow.  I see no reason to alter his plan at this stage.  I do not see any clear indication for starting antibiotics today.  We will obtain wound cultures in the operating room tomorrow and proceed accordingly.  This is all explained to the patient and she agrees with this plan of management.    Heather Roca, PA-C [facility] [phone]

## 2018-05-15 NOTE — Progress Notes (Signed)
HPI: Lowanda E Camper is a 33 y.o. 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 She underwent a tricuspid valve repair on 04/18/2018 with Dr. Owen. She was seen by Wayne Gold in the office on May 07, 2018.  At that time she had a superficial wound dehiscence without evidence of current infection.  She was instructed to perform daily dressing changes.     She returned to the office for scheduled followup for a wound check on 05/13/2018 and was found to have developed superficial dehiscence of the wound. She was evaluated by Ms. Tess Conte and Dr. Owen and the decision was made to schedule debridement in the OR and placement of a wound vac on 05/16/18 (tomorrow). A CT scan of the chest was obtained after the office visit on 05/13/18 that demonstrated the soft tissue separation but showed no evidence of bony destruction or mediastinal fluid collection to suggest mediastinitis. She called the office earlier today reporting that the wound had separated further and we asked her to come in for evaluation. She denies fever.    Current Outpatient Medications  Medication Sig Dispense Refill  . aspirin EC 325 MG EC tablet Take 1 tablet (325 mg total) by mouth daily. 30 tablet 0  . ferrous fumarate-b12-vitamic C-folic acid (TRINSICON / FOLTRIN) capsule Take 1 capsule by mouth 2 (two) times daily with a meal. (Patient not taking: Reported on 05/14/2018) 60 capsule 1  . ferrous sulfate 325 (65 FE) MG tablet Take 325 mg by mouth daily with breakfast.    . furosemide (LASIX) 40 MG tablet TAKE 1 TABLET (40 MG TOTAL) BY MOUTH DAILY. 90 tablet 0  . methadone (DOLOPHINE) 10 MG/5ML solution Take 85 mg by mouth daily.     . Multiple Vitamin (MULTIVITAMIN WITH MINERALS) TABS tablet Take 1 tablet by mouth daily.    . omeprazole (PRILOSEC) 20 MG  capsule TAKE 1 CAPSULE (20 MG TOTAL) BY MOUTH 2 (TWO) TIMES DAILY BEFORE A MEAL. (Patient taking differently: Take 20 mg by mouth daily. ) 60 capsule 11  . potassium chloride (K-DUR,KLOR-CON) 10 MEQ tablet Take 1 tablet (10 mEq total) by mouth daily. 30 tablet 0   No current facility-administered medications for this visit.     Physical Exam: VS: BP 118/68        HR 110        RR 16        Temp 98.8        SpO2 97%  Heart: Mildly tachycardic Chest: Dressing was removed exposing separation of the lower 4 to 5 cm of the sternal incision.  A blue monofilament suture is crisscrossing the wound but the tissues below the level of the stitch are separating.  Stitch was removed to facilitate packing.  There was no purulence.  The surrounding tissues appear healthy.  Sternum was stable.   Diagnostic Tests:  EXAM: CT CHEST WITH CONTRAST  TECHNIQUE: Multidetector CT imaging of the chest was performed during intravenous contrast administration.  CONTRAST:  75mL ISOVUE-300 IOPAMIDOL (ISOVUE-300) INJECTION 61%  COMPARISON:  Cardiac CT dated April 01, 2018.  FINDINGS: Cardiovascular: Mild cardiomegaly. Prior tricuspid valve replacement. No pericardial effusion. No thoracic aortic aneurysm or dissection. No central pulmonary embolism.  Mediastinum/Nodes: Nonspecific stranding within the anterior mediastinum. No enlarged mediastinal, hilar, or axillary lymph nodes. Thyroid gland, trachea, and esophagus demonstrate no significant   findings.  Lungs/Pleura: Subsegmental atelectasis/scarring in both lower lobes. No focal consolidation, pleural effusion, or pneumothorax. No suspicious pulmonary nodule.  Upper Abdomen: No acute abnormality.  Prior cholecystectomy.  Musculoskeletal: Prior median sternotomy. No osseous destruction or periosteal reaction. Small area of superficial dehiscence along the inferior sternal incision. Mild midline presternal soft tissue stranding. No fluid  collection.  IMPRESSION: 1. Mild nonspecific stranding within the anterior mediastinum, potentially postsurgical. No convincing evidence of mediastinitis. No fluid collection or mediastinal air. 2. Intact median sternotomy without osseous destruction or periosteal reaction to suggest osteomyelitis. 3. Small area of superficial dehiscence along the inferior sternal incision with mild midline presternal soft tissue stranding. No soft tissue abscess.   Electronically Signed   By: Obie Dredge M.D.   On: 05/13/2018 17:11  Impression / Plan: Superficial sternal wound dehiscence in a 33 year old female who is one-month status post tricuspid valve repair for severe tricuspid insufficiency related to endocarditis.  The wound measures approximately 5x5 cm and is about 3cm deep. It is clean, without purulence and has only minimal surrounding erythema. The sternum is stable.  The wound was carefully cleaned and repacked in the office with a saline-moistened dressing.  [name] is already scheduled for sternal debridement, exploration, and placement of wound VAC in the operating room by Dr. Cornelius Moras tomorrow.  I see no reason to alter his plan at this stage.  I do not see any clear indication for starting antibiotics today.  We will obtain wound cultures in the operating room tomorrow and proceed accordingly.  This is all explained to the patient and she agrees with this plan of management.    Leary Roca, PA-C [facility] [phone]

## 2018-05-16 ENCOUNTER — Encounter (HOSPITAL_COMMUNITY): Payer: Self-pay

## 2018-05-16 ENCOUNTER — Ambulatory Visit (HOSPITAL_COMMUNITY): Payer: Self-pay | Admitting: Vascular Surgery

## 2018-05-16 ENCOUNTER — Ambulatory Visit (HOSPITAL_COMMUNITY): Payer: Self-pay | Admitting: Anesthesiology

## 2018-05-16 ENCOUNTER — Encounter (HOSPITAL_COMMUNITY)
Admission: RE | Disposition: A | Discharge status: Home / Self Care | Payer: Self-pay | Source: Ambulatory Visit | Attending: Thoracic Surgery (Cardiothoracic Vascular Surgery)

## 2018-05-16 ENCOUNTER — Observation Stay (HOSPITAL_COMMUNITY)
Admission: RE | Admit: 2018-05-16 | Discharge: 2018-05-17 | Disposition: A | Discharge status: Home / Self Care | Payer: Self-pay | Source: Ambulatory Visit | Attending: Thoracic Surgery (Cardiothoracic Vascular Surgery) | Admitting: Thoracic Surgery (Cardiothoracic Vascular Surgery)

## 2018-05-16 DIAGNOSIS — Z791 Long term (current) use of non-steroidal anti-inflammatories (NSAID): Secondary | ICD-10-CM | POA: Insufficient documentation

## 2018-05-16 DIAGNOSIS — T8131XD Disruption of external operation (surgical) wound, not elsewhere classified, subsequent encounter: Secondary | ICD-10-CM

## 2018-05-16 DIAGNOSIS — Z79899 Other long term (current) drug therapy: Secondary | ICD-10-CM | POA: Insufficient documentation

## 2018-05-16 DIAGNOSIS — Z6841 Body Mass Index (BMI) 40.0 and over, adult: Secondary | ICD-10-CM | POA: Insufficient documentation

## 2018-05-16 DIAGNOSIS — F319 Bipolar disorder, unspecified: Secondary | ICD-10-CM | POA: Insufficient documentation

## 2018-05-16 DIAGNOSIS — T8131XA Disruption of external operation (surgical) wound, not elsewhere classified, initial encounter: Principal | ICD-10-CM | POA: Insufficient documentation

## 2018-05-16 DIAGNOSIS — K219 Gastro-esophageal reflux disease without esophagitis: Secondary | ICD-10-CM | POA: Insufficient documentation

## 2018-05-16 DIAGNOSIS — T8132XA Disruption of internal operation (surgical) wound, not elsewhere classified, initial encounter: Secondary | ICD-10-CM | POA: Diagnosis present

## 2018-05-16 DIAGNOSIS — Z87891 Personal history of nicotine dependence: Secondary | ICD-10-CM | POA: Insufficient documentation

## 2018-05-16 DIAGNOSIS — B1920 Unspecified viral hepatitis C without hepatic coma: Secondary | ICD-10-CM | POA: Insufficient documentation

## 2018-05-16 DIAGNOSIS — Z7982 Long term (current) use of aspirin: Secondary | ICD-10-CM | POA: Insufficient documentation

## 2018-05-16 DIAGNOSIS — M7989 Other specified soft tissue disorders: Secondary | ICD-10-CM | POA: Insufficient documentation

## 2018-05-16 DIAGNOSIS — T8149XA Infection following a procedure, other surgical site, initial encounter: Secondary | ICD-10-CM | POA: Insufficient documentation

## 2018-05-16 DIAGNOSIS — Z9889 Other specified postprocedural states: Secondary | ICD-10-CM

## 2018-05-16 DIAGNOSIS — F1911 Other psychoactive substance abuse, in remission: Secondary | ICD-10-CM | POA: Insufficient documentation

## 2018-05-16 DIAGNOSIS — F419 Anxiety disorder, unspecified: Secondary | ICD-10-CM | POA: Insufficient documentation

## 2018-05-16 HISTORY — DX: Infection following a procedure, other surgical site, initial encounter: T81.49XA

## 2018-05-16 HISTORY — PX: APPLICATION OF WOUND VAC: SHX5189

## 2018-05-16 HISTORY — PX: STERNAL WOUND DEBRIDEMENT: SHX1058

## 2018-05-16 LAB — POCT PREGNANCY, URINE: Preg Test, Ur: NEGATIVE

## 2018-05-16 SURGERY — DEBRIDEMENT, WOUND, STERNUM
Anesthesia: Monitor Anesthesia Care | Site: Chest

## 2018-05-16 MED ORDER — DEXMEDETOMIDINE HCL IN NACL 200 MCG/50ML IV SOLN
INTRAVENOUS | Status: AC
  Filled 2018-05-16: qty 50

## 2018-05-16 MED ORDER — MIDAZOLAM HCL 5 MG/5ML IJ SOLN
INTRAMUSCULAR | Status: DC | PRN
  Administered 2018-05-16 (×2): 2 mg via INTRAVENOUS

## 2018-05-16 MED ORDER — SODIUM CHLORIDE 0.9 % IV SOLN: 250.0000 mL | INTRAVENOUS | Status: DC | PRN

## 2018-05-16 MED ORDER — SODIUM CHLORIDE 0.9 % IR SOLN
Status: DC | PRN
  Administered 2018-05-16: 2000 mL

## 2018-05-16 MED ORDER — ONDANSETRON HCL 4 MG/2ML IJ SOLN
INTRAMUSCULAR | Status: AC
  Filled 2018-05-16: qty 2

## 2018-05-16 MED ORDER — FENTANYL CITRATE (PF) 250 MCG/5ML IJ SOLN
INTRAMUSCULAR | Status: DC | PRN
  Administered 2018-05-16: 100 ug via INTRAVENOUS
  Administered 2018-05-16 (×3): 50 ug via INTRAVENOUS

## 2018-05-16 MED ORDER — METHADONE HCL 5 MG PO TABS
85.0000 mg | ORAL_TABLET | Freq: Every day | ORAL | Status: DC
  Administered 2018-05-17: 85 mg via ORAL
  Filled 2018-05-16: qty 8

## 2018-05-16 MED ORDER — MIDAZOLAM HCL 2 MG/2ML IJ SOLN
INTRAMUSCULAR | Status: AC
  Filled 2018-05-16: qty 2

## 2018-05-16 MED ORDER — LACTATED RINGERS IV SOLN: INTRAVENOUS | Status: DC

## 2018-05-16 MED ORDER — PROPOFOL 10 MG/ML IV BOLUS
INTRAVENOUS | Status: AC
  Filled 2018-05-16: qty 40

## 2018-05-16 MED ORDER — ONDANSETRON HCL 4 MG/2ML IJ SOLN: 4.0000 mg | Freq: Once | INTRAMUSCULAR | Status: DC | PRN

## 2018-05-16 MED ORDER — PROPOFOL 1000 MG/100ML IV EMUL
INTRAVENOUS | Status: AC
  Filled 2018-05-16: qty 100

## 2018-05-16 MED ORDER — FERROUS SULFATE 325 (65 FE) MG PO TABS
325.0000 mg | ORAL_TABLET | Freq: Every day | ORAL | Status: DC
  Administered 2018-05-17: 325 mg via ORAL
  Filled 2018-05-16: qty 1

## 2018-05-16 MED ORDER — POTASSIUM CHLORIDE CRYS ER 10 MEQ PO TBCR
10.0000 meq | EXTENDED_RELEASE_TABLET | Freq: Every day | ORAL | Status: DC
  Administered 2018-05-17: 10 meq via ORAL
  Filled 2018-05-16: qty 1

## 2018-05-16 MED ORDER — ADULT MULTIVITAMIN W/MINERALS CH
1.0000 | ORAL_TABLET | Freq: Every day | ORAL | Status: DC
  Administered 2018-05-17: 1 via ORAL
  Filled 2018-05-16: qty 1

## 2018-05-16 MED ORDER — ACETAMINOPHEN 650 MG RE SUPP: 650.0000 mg | RECTAL | Status: DC | PRN

## 2018-05-16 MED ORDER — FENTANYL CITRATE (PF) 250 MCG/5ML IJ SOLN
INTRAMUSCULAR | Status: AC
  Filled 2018-05-16: qty 5

## 2018-05-16 MED ORDER — HYDROMORPHONE HCL 1 MG/ML IJ SOLN: 0.2500 mg | INTRAMUSCULAR | Status: DC | PRN

## 2018-05-16 MED ORDER — MEPERIDINE HCL 50 MG/ML IJ SOLN: 6.2500 mg | INTRAMUSCULAR | Status: DC | PRN

## 2018-05-16 MED ORDER — DEXMEDETOMIDINE HCL IN NACL 200 MCG/50ML IV SOLN
INTRAVENOUS | Status: DC | PRN
  Administered 2018-05-16 (×3): 8 ug via INTRAVENOUS

## 2018-05-16 MED ORDER — SODIUM CHLORIDE (PF) 0.9 % IJ SOLN
INTRAMUSCULAR | Status: DC | PRN
  Administered 2018-05-16: 1000 mL

## 2018-05-16 MED ORDER — METHADONE HCL 10 MG/ML PO CONC: 85.0000 mg | Freq: Every day | ORAL | Status: DC

## 2018-05-16 MED ORDER — PROPOFOL 500 MG/50ML IV EMUL
INTRAVENOUS | Status: DC | PRN
  Administered 2018-05-16: 100 ug/kg/min via INTRAVENOUS

## 2018-05-16 MED ORDER — ONDANSETRON HCL 4 MG/2ML IJ SOLN
INTRAMUSCULAR | Status: DC | PRN
  Administered 2018-05-16: 4 mg via INTRAVENOUS

## 2018-05-16 MED ORDER — ACETAMINOPHEN 325 MG PO TABS
650.0000 mg | ORAL_TABLET | ORAL | Status: DC | PRN
  Administered 2018-05-16 – 2018-05-17 (×3): 650 mg via ORAL
  Filled 2018-05-16 (×3): qty 2

## 2018-05-16 MED ORDER — ASPIRIN EC 325 MG PO TBEC
325.0000 mg | DELAYED_RELEASE_TABLET | Freq: Every day | ORAL | Status: DC
  Administered 2018-05-17: 325 mg via ORAL
  Filled 2018-05-16: qty 1

## 2018-05-16 MED ORDER — PANTOPRAZOLE SODIUM 40 MG PO TBEC
40.0000 mg | DELAYED_RELEASE_TABLET | Freq: Every day | ORAL | Status: DC
  Administered 2018-05-17: 40 mg via ORAL
  Filled 2018-05-16: qty 1

## 2018-05-16 MED ORDER — IBUPROFEN 600 MG PO TABS
600.0000 mg | ORAL_TABLET | Freq: Four times a day (QID) | ORAL | Status: DC | PRN
  Administered 2018-05-16: 600 mg via ORAL
  Filled 2018-05-16: qty 1

## 2018-05-16 MED ORDER — FUROSEMIDE 40 MG PO TABS
40.0000 mg | ORAL_TABLET | Freq: Every day | ORAL | Status: DC
  Administered 2018-05-17: 40 mg via ORAL
  Filled 2018-05-16: qty 1

## 2018-05-16 MED ORDER — LACTATED RINGERS IV SOLN
INTRAVENOUS | Status: DC | PRN
  Administered 2018-05-16: 09:00:00 via INTRAVENOUS

## 2018-05-16 MED ORDER — SODIUM CHLORIDE 0.9% FLUSH: 3.0000 mL | INTRAVENOUS | Status: DC | PRN

## 2018-05-16 MED ORDER — SODIUM CHLORIDE 0.9% FLUSH: 3.0000 mL | Freq: Two times a day (BID) | INTRAVENOUS | Status: DC

## 2018-05-16 MED ORDER — SODIUM CHLORIDE 0.9 % IV SOLN
INTRAVENOUS | Status: AC
  Filled 2018-05-16 (×2): qty 500000

## 2018-05-16 SURGICAL SUPPLY — 56 items
ATTRACTOMAT 16X20 MAGNETIC DRP (DRAPES) ×2 IMPLANT
BAG DECANTER FOR FLEXI CONT (MISCELLANEOUS) ×2 IMPLANT
BENZOIN TINCTURE PRP APPL 2/3 (GAUZE/BANDAGES/DRESSINGS) IMPLANT
BLADE SURG 10 STRL SS (BLADE) ×4 IMPLANT
BNDG GAUZE ELAST 4 BULKY (GAUZE/BANDAGES/DRESSINGS) IMPLANT
CANISTER SUCT 3000ML PPV (MISCELLANEOUS) ×2 IMPLANT
CANISTER WOUND CARE 500ML ATS (WOUND CARE) ×2 IMPLANT
CATH THORACIC 28FR RT ANG (CATHETERS) IMPLANT
CATH THORACIC 36FR (CATHETERS) IMPLANT
CATH THORACIC 36FR RT ANG (CATHETERS) IMPLANT
CLIP VESOCCLUDE SM WIDE 24/CT (CLIP) IMPLANT
CONN Y 3/8X3/8X3/8  BEN (MISCELLANEOUS) ×1
CONN Y 3/8X3/8X3/8 BEN (MISCELLANEOUS) ×1 IMPLANT
CONT SPEC 4OZ CLIKSEAL STRL BL (MISCELLANEOUS) IMPLANT
COVER WAND RF STERILE (DRAPES) ×2 IMPLANT
DRAPE LAPAROSCOPIC ABDOMINAL (DRAPES) ×2 IMPLANT
DRSG PAD ABDOMINAL 8X10 ST (GAUZE/BANDAGES/DRESSINGS) ×6 IMPLANT
DRSG VAC ATS MED SENSATRAC (GAUZE/BANDAGES/DRESSINGS) ×2 IMPLANT
ELECT REM PT RETURN 9FT ADLT (ELECTROSURGICAL) ×2
ELECTRODE REM PT RTRN 9FT ADLT (ELECTROSURGICAL) ×1 IMPLANT
GAUZE SPONGE 4X4 12PLY STRL (GAUZE/BANDAGES/DRESSINGS) ×2 IMPLANT
GAUZE XEROFORM 5X9 LF (GAUZE/BANDAGES/DRESSINGS) IMPLANT
GOWN STRL REUS W/ TWL LRG LVL3 (GOWN DISPOSABLE) ×4 IMPLANT
GOWN STRL REUS W/TWL LRG LVL3 (GOWN DISPOSABLE) ×4
HANDPIECE INTERPULSE COAX TIP (DISPOSABLE)
HEMOSTAT POWDER SURGIFOAM 1G (HEMOSTASIS) IMPLANT
HEMOSTAT SURGICEL 2X14 (HEMOSTASIS) ×2 IMPLANT
KIT BASIN OR (CUSTOM PROCEDURE TRAY) ×2 IMPLANT
KIT SUCTION CATH 14FR (SUCTIONS) IMPLANT
KIT TURNOVER KIT B (KITS) ×2 IMPLANT
NS IRRIG 1000ML POUR BTL (IV SOLUTION) ×2 IMPLANT
PACK CHEST (CUSTOM PROCEDURE TRAY) ×2 IMPLANT
PAD ARMBOARD 7.5X6 YLW CONV (MISCELLANEOUS) ×4 IMPLANT
PIN SAFETY STERILE (MISCELLANEOUS) IMPLANT
RUBBERBAND STERILE (MISCELLANEOUS) IMPLANT
SET HNDPC FAN SPRY TIP SCT (DISPOSABLE) IMPLANT
SOL PREP POV-IOD 4OZ 10% (MISCELLANEOUS) IMPLANT
SPONGE LAP 18X18 RF (DISPOSABLE) ×2 IMPLANT
STAPLER VISISTAT 35W (STAPLE) IMPLANT
STRAP MONTGOMERY 1.25X11-1/8 (MISCELLANEOUS) IMPLANT
SUT ETHILON 3 0 FSL (SUTURE) IMPLANT
SUT MNCRL AB 4-0 PS2 18 (SUTURE) IMPLANT
SUT PDS AB 1 CTX 36 (SUTURE) ×4 IMPLANT
SUT STEEL 6MS V (SUTURE) IMPLANT
SUT STEEL STERNAL CCS#1 18IN (SUTURE) IMPLANT
SUT STEEL SZ 6 DBL 3X14 BALL (SUTURE) IMPLANT
SUT VIC AB 1 CTX 36 (SUTURE)
SUT VIC AB 1 CTX36XBRD ANBCTR (SUTURE) IMPLANT
SUT VIC AB 2-0 CTX 27 (SUTURE) IMPLANT
SUT VIC AB 3-0 X1 27 (SUTURE) IMPLANT
SWAB COLLECTION DEVICE MRSA (MISCELLANEOUS) IMPLANT
SWAB CULTURE ESWAB REG 1ML (MISCELLANEOUS) IMPLANT
SYSTEM SAHARA CHEST DRAIN ATS (WOUND CARE) ×2 IMPLANT
TOWEL GREEN STERILE (TOWEL DISPOSABLE) ×2 IMPLANT
TOWEL GREEN STERILE FF (TOWEL DISPOSABLE) ×2 IMPLANT
WATER STERILE IRR 1000ML POUR (IV SOLUTION) ×2 IMPLANT

## 2018-05-16 NOTE — Op Note (Signed)
CARDIOTHORACIC SURGERY OPERATIVE NOTE  Date of Procedure:   05/16/2018  Preoperative Diagnosis:  Sternal Wound Dehiscence  Postoperative Diagnosis:  same  Procedure:      Excisional Debridement of Sternal Wound  Sternal Wire Removal x2  Placement of Negative Pressure Wound Care Device (Vacuum Assisted Closure)  Surgeon:    Salvatore Decent. 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 She was seen by Gershon Crane in the office on May 07, 2018 at which time she had a superficial wound dehiscence without evidence of current infection. She was instructed to perform daily dressing changes. She returned to the office for scheduled followup for a wound check on 05/13/2018 and was found to have developed increased superficial dehiscence of the wound with need for surgical debridement.  The patient has been seen and counseled at length regarding the indications, risks and potential benefits of surgery.  All questions have been answered, and the patient provides full informed consent for the operation as described.    DETAILS OF THE OPERATIVE PROCEDURE  The patient is brought to the operating room on the above mentioned date and placed in the supine position on the operating table.  Heavy intravenous sedation is administered by the anesthesia team under monitored anesthesia care under the direction of Dr. Arta Bruce.  Intravenous antibiotics were administered.  The patient's existing wound dressing is removed.  The  patient's anterior chest is prepared and draped in a sterile manner.  A timeout procedure was performed.  The wound is carefully explored.  There is evidence of fat necrosis with some tracking of the incision in a cephalad direction.  The incision is opened in this direction to the extent to which there is undermining of the skin incision.  No purulent drainage is identified in any portion of the wound.  Wound swab cultures are obtained and sent for routine aerobic and anaerobic culture.  All devitalized fat and old suture material are excised sharply.  In the midportion of the wound extends deep to the level of 2 exposed sternal wires at the inferior aspect of the patient's previous sternotomy.  These 2 sternal wires are removed.  After satisfactory excisional debridement of all devitalized fat and old suture material, the wound is cleaned using pulse lavage irrigation device with saline containing multiple antibiotics.  Satisfactory hemostasis is verified.  The final wound is measured and notably 11 cm in length by 6 cm in width by 4.5 cm in depth.  A wound vacuum-assisted closure device is applied.  A medium-sized sponge is trimmed to an appropriate size and placed.  After application of the device continuous suction is applied.  The patient tolerated the procedure well and is transported to the postanesthesia care unit in stable condition.  There were no intraoperative complications.  Estimated blood loss was trivial.     Salvatore Decent. Cornelius Moras MD 05/16/2018 9:30 AM

## 2018-05-16 NOTE — Transfer of Care (Signed)
Immediate Anesthesia Transfer of Care Note  Patient: Heather Day  Procedure(s) Performed: STERNAL WOUND DEBRIDEMENT with Sternal wire removal x2. (N/A Chest) APPLICATION OF WOUND VAC (N/A Chest)  Patient Location: PACU  Anesthesia Type:MAC  Level of Consciousness: awake, drowsy and patient cooperative  Airway & Oxygen Therapy: Patient Spontanous Breathing and Patient connected to nasal cannula oxygen  Post-op Assessment: Report given to RN and Post -op Vital signs reviewed and stable  Post vital signs: Reviewed and stable  Last Vitals:  Vitals Value Taken Time  BP 105/62 05/16/2018  9:31 AM  Temp    Pulse 73 05/16/2018  9:31 AM  Resp 11 05/16/2018  9:31 AM  SpO2 90 % 05/16/2018  9:31 AM  Vitals shown include unvalidated device data.  Last Pain:  Vitals:   05/16/18 0655  TempSrc:   PainSc: 6       Patients Stated Pain Goal: 3 (81/15/72 6203)  Complications: No apparent anesthesia complications

## 2018-05-16 NOTE — Anesthesia Postprocedure Evaluation (Signed)
Anesthesia Post Note  Patient: Heather Day  Procedure(s) Performed: STERNAL WOUND DEBRIDEMENT with Sternal wire removal x2. (N/A Chest) APPLICATION OF WOUND VAC (N/A Chest)     Patient location during evaluation: PACU Anesthesia Type: MAC Level of consciousness: awake and alert Pain management: pain level controlled Vital Signs Assessment: post-procedure vital signs reviewed and stable Respiratory status: spontaneous breathing, nonlabored ventilation, respiratory function stable and patient connected to nasal cannula oxygen Cardiovascular status: stable and blood pressure returned to baseline Postop Assessment: no apparent nausea or vomiting Anesthetic complications: no    Last Vitals:  Vitals:   05/16/18 1200 05/16/18 1230  BP: 102/66 114/71  Pulse: 75 77  Resp: 16 17  Temp:  36.4 C  SpO2: 96% 96%    Last Pain:  Vitals:   05/16/18 1230  TempSrc:   PainSc: 0-No pain                 Yesenia Locurto DAVID

## 2018-05-16 NOTE — Anesthesia Procedure Notes (Addendum)
Central Venous Catheter Insertion Performed by: Kipp Brood, MD, anesthesiologist Start/End3/07/2018 8:05 AM, 05/16/2018 8:15 AM Patient location: Pre-op. Preanesthetic checklist: patient identified, IV checked, site marked, risks and benefits discussed, surgical consent, monitors and equipment checked, pre-op evaluation, timeout performed and anesthesia consent Lidocaine 1% used for infiltration and patient sedated Hand hygiene performed  and maximum sterile barriers used  Catheter size: 8 Fr Total catheter length 16. Central line was placed.Double lumen Procedure performed using ultrasound guided technique. Ultrasound Notes:image(s) printed for medical record Attempts: 1 Following insertion, dressing applied and line sutured. Post procedure assessment: blood return through all ports  Patient tolerated the procedure well with no immediate complications. Additional procedure comments: Attempted L. IJ insertion done without success.Marland Kitchen

## 2018-05-16 NOTE — Interval H&P Note (Signed)
History and Physical Interval Note:  05/16/2018 6:00 AM  Heather Day  has presented today for surgery, with the diagnosis of STERNAL WOUND INFECTION  The various methods of treatment have been discussed with the patient and family. After consideration of risks, benefits and other options for treatment, the patient has consented to  Procedure(s): STERNAL WOUND DEBRIDEMENT (N/A) APPLICATION OF WOUND VAC (N/A) as a surgical intervention .  The patient's history has been reviewed, patient examined, no change in status, stable for surgery.  I have reviewed the patient's chart and labs.  Questions were answered to the patient's satisfaction.     Purcell Nails

## 2018-05-16 NOTE — Progress Notes (Signed)
Patient arrived to OR alert and oriented. Patient able to confirm name, DOB, procedure, NDKA, metal in body (sternal wires) and npo status. Patient states she has 6/10 chest pain.  Yvetta Coder, RN

## 2018-05-16 NOTE — Anesthesia Procedure Notes (Signed)
Procedure Name: MAC Date/Time: 05/16/2018 8:45 AM Performed by: Renato Shin, CRNA Pre-anesthesia Checklist: Patient identified, Emergency Drugs available, Suction available and Patient being monitored Oxygen Delivery Method: Simple face mask Preoxygenation: Pre-oxygenation with 100% oxygen Induction Type: IV induction Placement Confirmation: positive ETCO2 and breath sounds checked- equal and bilateral Dental Injury: Teeth and Oropharynx as per pre-operative assessment

## 2018-05-17 ENCOUNTER — Encounter (HOSPITAL_COMMUNITY): Payer: Self-pay | Admitting: Thoracic Surgery (Cardiothoracic Vascular Surgery)

## 2018-05-17 MED ORDER — ACETAMINOPHEN 325 MG PO TABS: 650.0000 mg | ORAL_TABLET | ORAL | Status: DC | PRN

## 2018-05-17 MED ORDER — CEPHALEXIN 500 MG PO CAPS: 500.0000 mg | ORAL_CAPSULE | Freq: Four times a day (QID) | ORAL | 0 refills | Status: AC

## 2018-05-17 NOTE — Discharge Instructions (Signed)
Wound Dehiscence  Wound dehiscence is when a surgical incision breaks open and does not heal properly after surgery. It usually happens 7-10 days after surgery. This can be a serious condition. It is important to identify and treat this condition early. What are the causes? Common causes of this condition include:  Stretching of the wound area. This may be caused by lifting, vomiting, violent coughing, or straining during bowel movements.  Wound infection.  Early removal or rupture of stitches (sutures). What increases the risk? The following factors may make you more likely to develop this condition:  Obesity.  Lung disease.  Smoking.  Poor nutrition.  Contamination during surgery.  Medical problems that make it harder to heal, such as diabetes or autoimmune diseases. What are the signs or symptoms? Symptoms of this condition include:  Bleeding or drainage from the wound.  Pain.  Fever.  The wound starting to break open. How is this diagnosed? Your health care provider may diagnose wound dehiscence by monitoring the incision and noting any changes in the wound. These changes can include a gap in the incision and an increase in drainage or pain. The health care provider may ask you if you have noticed any stretching or tearing of the wound. You may also have tests, including:  Wound culture tests to determine if there is an infection.  Imaging studies, such as an MRI scan or CT scan, to determine if there is a collection of pus or fluid in the wound area.  Blood tests to check for infection and inflammation. How is this treated? Treatment for this condition may include:  Wound care to keep the incision clean and help it heal from the inside out.  Surgical repair.  Antibiotic medicine to treat or prevent infection.  Medicines to reduce pain and swelling. Follow these instructions at home: Medicines  Take over-the-counter and prescription medicines only as told  by your health care provider. Taking pain medicine 30 minutes before changing a bandage (dressing) can help relieve pain.  If you were prescribed an antibiotic, take it as told by your health care provider. Do not stop taking the antibiotic even if you start to feel better.  Use anti-itch medicine as told by your health care provider. The wound may feel itchy when it is healing. Wound care   Follow instructions from your health care provider about how to take care of your wound. Make sure you: ? Wash your hands with soap and water before you change your bandage (dressing) or wash the wound area. If soap and water are not available, use hand sanitizer. ? Gently wash the wound area with mild soap and water 2 times a day, or as directed. Rinse off the soap. Pat the area dry with a clean towel. Do not rub the wound. ? Change the dressing and the packing inside as told by your health care provider.  Do not scratch or pick at the wound.  Check your wound area every day for signs of infection. Check for: ? More redness, swelling, or pain. ? More fluid or blood. ? Warmth. ? Pus or a bad smell. Activity  Avoid exercises that make you sweat heavily or could cause stretching of your wound.  Do not lift anything that is heavier than 10 lb (4.5 kg) until the wound is healed or until your health care provider says that it is safe. General instructions  Do not take baths, swim, or use a hot tub until your health care provider approves.  You may take showers.  Keep all follow-up visits as told by your health care provider. This is important. Contact a health care provider if:  Your wound does not seem to be healing properly.  You have a fever. Get help right away if:  You have more redness, swelling, or pain around your wound.  You have more fluid coming from your wound.  Your wound feels warm to the touch.  You have pus or a bad smell coming from your wound.  Your wound breaks open  farther.  You have redness streaking or spreading from your wound.  You have excessive bleeding from your wound. Summary  Wound dehiscence is when a surgical incision breaks open and does not heal properly after surgery.  Your health care provider may diagnose wound dehiscence by monitoring the incision and noting any changes in the wound.  Follow instructions from your health care provider about how to take care of your wound.  Check your wound area every day for signs of infection, such as redness, swelling, pain, fluid, blood, warmth, pus, or a bad smell.  If you were prescribed antibiotic medicine, take it as told by your health care provider. Do not stop taking the antibiotic even if you start to feel better. This information is not intended to replace advice given to you by your health care provider. Make sure you discuss any questions you have with your health care provider. Document Released: 05/20/2003 Document Revised: 02/02/2016 Document Reviewed: 02/02/2016 Elsevier Interactive Patient Education  2019 [employer].   Wound vac change per home health M,W,F

## 2018-05-17 NOTE — Care Management Note (Addendum)
Case Management Note Donn Pierini RN, BSN Transitions of Care Unit 4E- RN Case Manager [phone]  Patient Details  Name: Heather Day MRN: [id number] Date of Birth: Aug 12, 1985  Subjective/Objective:    Pt presented with sternal wound dehiscence s/p I&D and wound VAC placement                Action/Plan: PTA pt lived at home, independent (hx IVDU)- referral received for home wound VAC needs- forms placed on shadow chart for signature. Pt will need [facility] charity wound VAC and HH  Arranged. CM spoke with pt and Charity forms filled out, verified with pt address and phone # in chart. Per pt she does not have PCP, will f/u with Dr. Cornelius Moras for sternal wound. Explained to pt charity wound VAC program with [facility] and also [employer] with [name]. Call made to Traci with [facility] for [facility] wound [place]- forms have been faxed to [facility] and pending approval- home wound VAC will be delivered to room for transition home. Call also made to [facility] with [name] drsg changes M/W/F. [name] will f/u for their charity [facility] program with pt. CM will also provide pt info on clinics for PCP needs. Discharge pending home wound VAC approval.   Update:- 1400- per [facility] pt has been approved for charity home wound VAC- should be delivered to bedside this afternoon by 4pm. Pt has been informed and call made to [facility] with attending for d/c order. Bedside RN will connect home wound VAC for transition home.   Expected Discharge Date:                  Expected Discharge Plan:  Home w Home Health Services  In-House Referral:     Discharge planning Services  CM Consult  Post Acute Care Choice:  Durable Medical Equipment, Home Health Choice offered to:  Patient  DME Arranged:  Vac DME Agency:  [facility]  HH Arranged:  RN HH Agency:  [facility]  Status of Service:  Completed, signed off  If discussed at Long Length of Stay Meetings, dates discussed:    Discharge Disposition: home/home  health   Additional Comments:  Darrold Span, RN 05/17/2018, 12:22 PM

## 2018-05-17 NOTE — Progress Notes (Signed)
Order received to discharge patient.  Telemetry monitor removed and CCMD notified.  Right IJ removed per MD order without difficulty.  Pt instructed to lay in bed for 30 minutes with BP cycling.  Home wound vac installed.  Discharge instructions, follow up, medications and instructions for their use discussed with patient.

## 2018-05-17 NOTE — Progress Notes (Addendum)
      [address]       [place] Day [address]             [phone]      1 Day Post-Op Procedure(s) (LRB): STERNAL WOUND DEBRIDEMENT with Sternal wire removal x2. (N/A) APPLICATION OF WOUND VAC (N/A) Subjective: Doing okay today. No complaints.   Objective: Vital signs in last 24 hours: Temp:  [97.9 F (36.6 C)-98.3 F (36.8 C)] 98.3 F (36.8 C) (03/06 0449) Pulse Rate:  [66-76] 76 (03/06 0449) Cardiac Rhythm: Normal sinus rhythm;Bundle branch block (03/06 0736) Resp:  [0-23] 16 (03/06 0900) BP: (101-116)/(63-65) 116/65 (03/06 0449) SpO2:  [92 %-96 %] 93 % (03/06 0449)     Intake/Output from previous day: 03/05 0701 - 03/06 0700 In: 1100 [P.O.:400; I.V.:700] Out: 150 [Drains:100; Blood:50] Intake/Output this shift: Total I/O In: 360 [P.O.:360] Out: -   General appearance: alert, cooperative and no distress Heart: regular rate and rhythm, S1, S2 normal, no murmur, click, rub or gallop Lungs: clear to auscultation bilaterally Abdomen: soft, non-tender; bowel sounds normal; no masses,  no organomegaly Extremities: extremities normal, atraumatic, no cyanosis or edema Wound: wound vac with good suction  Lab Results: Recent Labs    05/15/18 1440  WBC 9.0  HGB 10.5*  HCT 32.7*  PLT 258   BMET:  Recent Labs    05/15/18 1440  NA 136  K 4.0  CL 103  CO2 24  GLUCOSE 89  BUN 8  CREATININE 0.76  CALCIUM 8.8*    PT/INR: No results for input(s): LABPROT, INR in the last 72 hours. ABG    Component Value Date/Time   PHART 7.322 (L) 04/18/2018 1455   HCO3 21.7 04/18/2018 1455   TCO2 23 04/18/2018 1455   ACIDBASEDEF 4.0 (H) 04/18/2018 1455   O2SAT 93.0 04/18/2018 1455   CBG (last 3)  No results for input(s): GLUCAP in the last 72 hours.  Assessment/Plan: S/P Procedure(s) (LRB): STERNAL WOUND DEBRIDEMENT with Sternal wire removal x2. (N/A) APPLICATION OF WOUND VAC (N/A)  1. CV- NSR in the 90s, BP well controlled 2. Pulm- tolerating  room air with good oxygen saturation 3. Renal- creatinine 0.76, electrolytes okay 4. H and H 10.5/32.7 5. Wound vac with good suction.  6. Post-op abx given, will await OR cultures to determine PO antibiotics for home.  Plan: Working on charity wound vac and HH since patient is uninsured. Pain is well controlled.    LOS: 0 days    Heather Day 05/17/2018  I have seen and examined the patient and agree with the assessment and plan as outlined.  OR wound culture revealed "few Staph aureus".  Will treat with Keflex x7 days and f/u sensitivities when available.  Heather Nails, MD 05/17/2018 4:30 PM

## 2018-05-17 NOTE — Progress Notes (Signed)
Patient's significant other, Logan, was called on his cell phone immediately after they left the hospital and informed that a prescription for Keflex had been called into the [facility] pharmacy in [facility].  Logan said they were on their way to [facility] to pick up the scripts.

## 2018-05-17 NOTE — Social Work (Signed)
RNCM has arranged wound vac f/u.  CSW signing off. Please consult if any additional needs arise.  Doy Hutching, LCSWA [facility] Clinical Social Work [phone]

## 2018-05-17 NOTE — Discharge Summary (Addendum)
Physician Discharge Summary        [address] [place].[address]       [place], [place] [address]             [phone]       Patient ID: Heather Day MRN: [id number] DOB/AGE: [birth year] 33 y.o.  Admit date: 05/16/2018 Discharge date: 05/17/2018  Admission Diagnoses:  Patient Active Problem List   Diagnosis Date Noted  . Hepatitis C   . Sternal wound dehiscence 05/13/2018  . 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 History of drug abuse (HCC) 02/26/2017  . 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 She underwent a tricuspid valve repair on 04/18/2018 with Dr. Cornelius Moras. She was seen by Gershon Crane in the office on May 07, 2018. At that time she had a superficial wound dehiscence without evidence of current infection. She was instructed to perform daily dressing changes.   She returned to the office for scheduled followup for a wound check on 05/13/2018 and was found to have developed superficial dehiscence of the wound. She was evaluated by Ms. Jari Favre and Dr. Cornelius Moras and the decision was made to schedule debridement in the [facility] placement of a wound vac on 05/16/18 (tomorrow). A CT scan of the chest was obtained after the office visit on 05/13/18 that demonstrated the soft tissue separation but showed no evidence of bony destruction or mediastinal fluid collection to suggest mediastinitis. She called the office earlier today reporting that the wound had separated further and we asked her to come in for evaluation. She denies fever.    Hospital Course:  [name] was seen in our outpatient office  for a wound check and it was determined that she would need to undergo surgical debridement and placement of a wound VAC in the OR due to superficial wound dehiscence with fat necrosis.  On 05/16/2018 Ms. Deshmukh underwent an excisional debridement of sternal wound, sternal wire removal x2, and placement of negative pressure wound care device by Dr. Cornelius Moras.  She tolerated the seizure well and was transferred to the cardiac telemetry unit for continued care.  Postop day 1 she is doing well.  She received some postoperative antibiotics.  Intraoperative wound cultures were sent and are pending at the current time.  Her pain is well controlled on her current regimen of Motrin 600 mg every 6 hours and Tylenol as needed.  She is to not receive any narcotic pain medications due to her past medical history of heroin abuse.  She is currently on her home dose of methadone.  We have been working closely with home health services to establish care and wound VAC changes at home 3 times a week.  Case management and social work has been consulted. She has been set up for home health and has a wound vac for home.   Consults: None  Significant Diagnostic Studies:   CLINICAL DATA:  Sternal wound infection. Evaluate for mediastinitis. Prior tricuspid valve repair on 04/18/2018.  EXAM: CT CHEST WITH CONTRAST  TECHNIQUE: Multidetector CT imaging of the chest was performed during intravenous contrast administration.  CONTRAST:  75mL ISOVUE-300 IOPAMIDOL (ISOVUE-300) INJECTION 61%  COMPARISON:  Cardiac CT dated April 01, 2018.  FINDINGS: Cardiovascular: Mild cardiomegaly. Prior tricuspid valve replacement. No pericardial effusion. No thoracic aortic aneurysm or dissection. No central pulmonary embolism.  Mediastinum/Nodes: Nonspecific stranding within the anterior mediastinum. No enlarged mediastinal, hilar, or axillary lymph nodes. Thyroid gland, trachea, and esophagus demonstrate no significant  findings.  Lungs/Pleura: Subsegmental atelectasis/scarring in both lower lobes. No focal consolidation, pleural effusion, or pneumothorax. No suspicious pulmonary nodule.  Upper Abdomen: No acute abnormality.  Prior cholecystectomy.  Musculoskeletal: Prior median sternotomy. No osseous destruction or periosteal reaction. Small area of superficial dehiscence along the inferior sternal incision. Mild midline presternal soft tissue stranding. No fluid collection.  IMPRESSION: 1. Mild nonspecific stranding within the anterior mediastinum, potentially postsurgical. No convincing evidence of mediastinitis. No fluid collection or mediastinal air. 2. Intact median sternotomy without osseous destruction or periosteal reaction to suggest osteomyelitis. 3. Small area of superficial dehiscence along the inferior sternal incision with mild midline presternal soft tissue stranding. No soft tissue abscess.   Electronically Signed   By: Obie Dredge M.D.   On: 05/13/2018 17:11   Treatments:  CARDIOTHORACIC SURGERY OPERATIVE NOTE  Date of Procedure:                            05/16/2018  Preoperative Diagnosis:                  Sternal Wound Dehiscence  Postoperative Diagnosis:                same  Procedure:                                           Excisional Debridement of Sternal Wound  Sternal Wire Removal x2  Placement of Negative Pressure Wound Care Device (Vacuum Assisted Closure)  Surgeon:                                            Salvatore Decent. Cornelius Moras, MD  Anesthesia:                                        Arta Bruce, MD  Operative Findings:   ? Superficial wound dehiscence with fat necrosis ? No evidence of wound infection   Discharge Exam: Blood pressure 102/61, pulse 80, temperature 98.3 F (36.8 C), temperature source Oral, resp. rate 16, last menstrual period 04/13/2018, SpO2 95 %.   General appearance: alert, cooperative and no distress Heart:  regular rate and rhythm, S1, S2 normal, no murmur, click, rub or gallop Lungs: clear to auscultation bilaterally Abdomen: soft, non-tender; bowel sounds normal; no masses,  no organomegaly Extremities: extremities normal, atraumatic, no cyanosis or edema Wound: wound vac with good suction   Disposition: Discharge disposition: 01-Home or Self Care        Allergies as of 05/17/2018   No Known Allergies     Medication List    TAKE these medications   acetaminophen 325 MG tablet Commonly known as:  TYLENOL Take 2 tablets (650 mg total) by mouth every 4 (four) hours as needed for mild pain (or Fever >/= 101).   aspirin 325  MG EC tablet Take 1 tablet (325 mg total) by mouth daily.   ferrous fumarate-b12-vitamic C-folic acid capsule Commonly known as:  TRINSICON / FOLTRIN Take 1 capsule by mouth 2 (two) times daily with a meal.   ferrous sulfate 325 (65 FE) MG tablet Take 325 mg by mouth daily with breakfast.   furosemide 40 MG tablet Commonly known as:  LASIX TAKE 1 TABLET (40 MG TOTAL) BY MOUTH DAILY.   ibuprofen 200 MG tablet Commonly known as:  ADVIL,MOTRIN Take 600 mg by mouth every 6 (six) hours as needed.   methadone 10 MG/5ML solution Commonly known as:  DOLOPHINE Take 85 mg by mouth daily.   multivitamin with minerals Tabs tablet Take 1 tablet by mouth daily.   omeprazole 20 MG capsule Commonly known as:  PRILOSEC TAKE 1 CAPSULE (20 MG TOTAL) BY MOUTH 2 (TWO) TIMES DAILY BEFORE A MEAL. What changed:  when to take this   potassium chloride 10 MEQ tablet Commonly known as:  K-DUR,KLOR-CON Take 1 tablet (10 mEq total) by mouth daily.      Follow-up Information    [facility] Follow up.   Specialty:  Home Health Services Why:  HHRN arranged for home wound VAC drsg changes MWF ([facility] home wound VAC arranged) Contact information: [address] [place] [address] [phone]        [facility] Follow up.   Specialty:  Cardiothoracic Surgery Why:  Your routine follow-up appointment is on 05/27/2018 at 1:00pm. Please arrive at 12:30pm for a chest xray located at [facility] which is on the first floor of our  Contact information: [address] [place], [address] [place] [address] [phone]          Signed: Sharlene Dory 05/17/2018, 3:03 PM   Addendum: Operative cultures grew a few Staph aureus.  Will discharge on Keflex 500mg  po QID for 7 days.

## 2018-05-20 LAB — CULTURE, BLOOD (ROUTINE X 2)
CULTURE: NO GROWTH
Culture: NO GROWTH

## 2018-05-21 ENCOUNTER — Ambulatory Visit: Payer: Self-pay | Admitting: Cardiology

## 2018-05-21 ENCOUNTER — Ambulatory Visit (INDEPENDENT_AMBULATORY_CARE_PROVIDER_SITE_OTHER): Payer: Self-pay | Admitting: Cardiology

## 2018-05-21 ENCOUNTER — Encounter: Payer: Self-pay | Admitting: Cardiology

## 2018-05-21 VITALS — BP 120/70 | HR 101 | Ht 68.0 in | Wt 324.0 lb

## 2018-05-21 DIAGNOSIS — Z9889 Other specified postprocedural states: Secondary | ICD-10-CM

## 2018-05-21 DIAGNOSIS — F1911 Other psychoactive substance abuse, in remission: Secondary | ICD-10-CM

## 2018-05-21 DIAGNOSIS — T8132XD Disruption of internal operation (surgical) wound, not elsewhere classified, subsequent encounter: Secondary | ICD-10-CM

## 2018-05-21 LAB — AEROBIC/ANAEROBIC CULTURE W GRAM STAIN (SURGICAL/DEEP WOUND)

## 2018-05-21 NOTE — Progress Notes (Signed)
Cardiology Office Note:    Date:  05/21/2018   ID:  Heather Day, DOB January 05, 1986, MRN [id number]  PCP:  Patient, No Pcp Per  Cardiologist:  [name], MD    Referring MD: No ref. provider found   Chief Complaint  Patient presents with  . Follow-up  Doing better  History of Present Illness:    Heather Day is a 33 y.o. female with past medical history significant for drug abuse however she is clean now she ended up having tricuspid valve repair for severe tricuspid regurgitation which is sequela of endocarditis.  She went to surgery with no major difficulties.  However after that she is trying to push herself related to heart and of having dehiscence of her sternal wound that is being treated aggressively with antibiotic as well as local care.  Gradually slowly getting better complain of being weak tired but no chest pain tightness squeezing pressure burning chest overall she is happy that surgery is over and she is gradually feeling better  Past Medical History:  Diagnosis Date  . Anxiety    panic attacks  . Bipolar disorder (HCC)   . Drug abuse, IV (HCC)   . Dyspnea    when walking up stairs  . Gallstone   . GERD (gastroesophageal reflux disease)   . Heartburn   . Hepatitis C   . Periodontitis chronic, apical    dental caries  . Pre-diabetes   . PTSD (post-traumatic stress disorder)   . 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 Severe tricuspid regurgitation   . Wears glasses   . Wound infection after surgery    Sternal wound    Past Surgical History:  Procedure Laterality Date  . APPENDECTOMY    . APPLICATION OF WOUND VAC N/A 05/16/2018   Procedure: APPLICATION OF WOUND VAC;  Surgeon: Purcell Nails, MD;  Location: [facility] OR;  Service: Thoracic;  Laterality: N/A;  . CESAREAN SECTION    .  CHOLECYSTECTOMY N/A 01/15/2018   Procedure: LAPAROSCOPIC CHOLECYSTECTOMY ERAS PATHWAY;  Surgeon: Glenna Fellows, MD;  Location: [facility] OR;  Service: General;  Laterality: N/A;  . INTRAOPERATIVE CHOLANGIOGRAM N/A 01/15/2018   Procedure: INTRAOPERATIVE CHOLANGIOGRAM;  Surgeon: Glenna Fellows, MD;  Location: [facility] OR;  Service: General;  Laterality: N/A;  . LAPAROSCOPIC APPENDECTOMY N/A 07/04/2017   Procedure: APPENDECTOMY LAPAROSCOPIC;  Surgeon: Luretha Murphy, MD;  Location: [facility] ORS;  Service: General;  Laterality: N/A;  . MULTIPLE EXTRACTIONS WITH ALVEOLOPLASTY Bilateral 11/29/2017   Procedure: Extraction of tooth #'s 2,5-15, and 31 with alveoloplasty and gross debridement of remaining dentition;  Surgeon: Charlynne Pander, DDS;  Location: [facility] OR;  Service: Oral Surgery;  Laterality: Bilateral;  . STERNAL WOUND DEBRIDEMENT N/A 05/16/2018   Procedure: STERNAL WOUND DEBRIDEMENT with Sternal wire removal x2.;  Surgeon: Purcell Nails, MD;  Location: [facility] OR;  Service: Thoracic;  Laterality: N/A;  . TEE WITHOUT CARDIOVERSION N/A 10/03/2017   Procedure: TRANSESOPHAGEAL ECHOCARDIOGRAM (TEE);  Surgeon: Jodelle Red, MD;  Location: [facility] ENDOSCOPY;  Service: Cardiovascular;  Laterality: N/A;  . TEE WITHOUT CARDIOVERSION N/A 04/18/2018   Procedure: TRANSESOPHAGEAL ECHOCARDIOGRAM (TEE);  Surgeon: Purcell Nails, MD;  Location: [facility] OR;  Service: Open Heart Surgery;  Laterality: N/A;  . TRICUSPID VALVE REPLACEMENT N/A 04/18/2018   Procedure: TRICUSPID VALVE REPAIR;  Surgeon: Purcell Nails, MD;  Location: [facility] OR;  Service:  Open Heart Surgery;  Laterality: N/A;    Current Medications: Current Meds  Medication Sig  . acetaminophen (TYLENOL) 325 MG tablet Take 2 tablets (650 mg total) by mouth every 4 (four) hours as needed for mild pain (or Fever >/= 101).  Marland Kitchen aspirin EC 325 MG EC tablet Take 1 tablet (325 mg total) by mouth daily.  . cephALEXin (KEFLEX) 500 MG capsule Take 1 capsule (500 mg total) by mouth  4 (four) times daily for 7 days.  . ferrous fumarate-b12-vitamic C-folic acid (TRINSICON / FOLTRIN) capsule Take 1 capsule by mouth 2 (two) times daily with a meal.  . ferrous sulfate 325 (65 FE) MG tablet Take 325 mg by mouth daily with breakfast.  . furosemide (LASIX) 40 MG tablet TAKE 1 TABLET (40 MG TOTAL) BY MOUTH DAILY.  Marland Kitchen ibuprofen (ADVIL,MOTRIN) 200 MG tablet Take 600 mg by mouth every 6 (six) hours as needed.  . methadone (DOLOPHINE) 10 MG/5ML solution Take 85 mg by mouth daily.   . Multiple Vitamin (MULTIVITAMIN WITH MINERALS) TABS tablet Take 1 tablet by mouth daily.  Marland Kitchen omeprazole (PRILOSEC) 20 MG capsule TAKE 1 CAPSULE (20 MG TOTAL) BY MOUTH 2 (TWO) TIMES DAILY BEFORE A MEAL. (Patient taking differently: Take 20 mg by mouth daily. )  . potassium chloride (K-DUR,KLOR-CON) 10 MEQ tablet Take 1 tablet (10 mEq total) by mouth daily.     Allergies:   Patient has no known allergies.   Social History   Socioeconomic History  . Marital status: Single    Spouse name: Not on file  . Number of children: Not on file  . Years of education: Not on file  . Highest education level: Not on file  Occupational History  . Not on file  Social Needs  . Financial resource strain: Not on file  . Food insecurity:    Worry: Not on file    Inability: Not on file  . Transportation needs:    Medical: Not on file    Non-medical: Not on file  Tobacco Use  . Smoking status: Former Smoker    Packs/day: 1.00    Years: 20.00    Pack years: 20.00    Types: Cigarettes    Last attempt to quit: 04/26/2017    Years since quitting: 1.0  . Smokeless tobacco: Never Used  Substance and Sexual Activity  . Alcohol use: No  . Drug use: Not Currently    Types: IV, Heroin    Comment: clean since 10/09/16  . Sexual activity: Not on file  Lifestyle  . Physical activity:    Days per week: Not on file    Minutes per session: Not on file  . Stress: Not on file  Relationships  . Social connections:    Talks  on phone: Not on file    Gets together: Not on file    Attends religious service: Not on file    Active member of club or organization: Not on file    Attends meetings of clubs or organizations: Not on file    Relationship status: Not on file  Other Topics Concern  . Not on file  Social History Narrative  . Not on file     Family History: The patient's family history includes Healthy in her father; Hypotension in her mother; Hypothyroidism in her mother. ROS:   Please see the history of present illness.    All 14 point review of systems negative except as described per history of present illness  EKGs/Labs/Other  Studies Reviewed:      Recent Labs: 09/04/2017: NT-Pro BNP 242 04/19/2018: Magnesium 2.0 05/15/2018: ALT 14; BUN 8; Creatinine, Ser 0.76; Hemoglobin 10.5; Platelets 258; Potassium 4.0; Sodium 136  Recent Lipid Panel    Component Value Date/Time   CHOL 214 (H) 02/26/2017 1425   TRIG 184 (H) 02/26/2017 1425   HDL 45 02/26/2017 1425   CHOLHDL 4.8 (H) 02/26/2017 1425   LDLCALC 132 (H) 02/26/2017 1425    Physical Exam:    VS:  BP 120/70   Pulse (!) 101   Ht  (1.727 m)   Wt (!) 324 lb (147 kg)   SpO2 98%   BMI 49.26 kg/m     Wt Readings from Last 3 Encounters:  05/21/18 (!) 324 lb (147 kg)  05/17/18 (!) 328 lb 6.4 oz (149 kg)  05/15/18 (!) 328 lb 6.4 oz (149 kg)     GEN:  Well nourished, well developed in no acute distress HEENT: Normal NECK: No JVD; No carotid bruits LYMPHATICS: No lymphadenopathy CARDIAC: RRR, no murmurs, no rubs, no gallops RESPIRATORY:  Clear to auscultation without rales, wheezing or rhonchi  ABDOMEN: Soft, non-tender, non-distended MUSCULOSKELETAL:  No edema; No deformity  SKIN: Warm and dry LOWER EXTREMITIES: no swelling NEUROLOGIC:  Alert and oriented x 3 PSYCHIATRIC:  Normal affect   ASSESSMENT:    1. S/P tricuspid valve repair   2. History of drug abuse (HCC)   3. Sternal wound dehiscence, subsequent encounter     PLAN:    In order of problems listed above:  1. Status post tricuspid valve repair.  Recovering.  Sadly and recovery is complicated but wound dehiscence but overall she is doing better things are looking more optimistic now.  We spent a great deal of time talking about her issue with drug abuse the fact that she was able to overcome this bad habit I congratulated her for it and I strongly encouraged her to stay away from drugs and she is committed to be that way. 2. Sternal wound dehiscence follow-up by cardiothoracic and wound care team.  Gradually getting better. 3. To stop Prilosec and see if she has any symptoms if she does she need to go back on the medication if not we will simply continue without it   Medication Adjustments/Labs and Tests Ordered: Current medicines are reviewed at length with the patient today.  Concerns regarding medicines are outlined above.  No orders of the defined types were placed in this encounter.  Medication changes: No orders of the defined types were placed in this encounter.   Signed, [name], MD, [facility] 05/21/2018 2:32 PM    [facility]

## 2018-05-21 NOTE — Patient Instructions (Signed)
Medication Instructions:  Your physician recommends that you continue on your current medications as directed. Please refer to the Current Medication list given to you today.  If you need a refill on your cardiac medications before your next appointment, please call your pharmacy.   Lab work: None.  If you have labs (blood work) drawn today and your tests are completely normal, you will receive your results only by: . MyChart Message (if you have MyChart) OR . A paper copy in the mail If you have any lab test that is abnormal or we need to change your treatment, we will call you to review the results.  Testing/Procedures: None.    Follow-Up: At [facility], you and your health needs are our priority.  As part of our continuing mission to provide you with exceptional heart care, we have created designated Provider Care Teams.  These Care Teams include your primary Cardiologist (physician) and Advanced Practice Providers (APPs -  Physician Assistants and Nurse Practitioners) who all work together to provide you with the care you need, when you need it. You will need a follow up appointment in 3 months.  Please call our office 2 months in advance to schedule this appointment.  You may see No primary care provider on file. or another member of our [facility] Provider Team in [place]: Brian Munley, MD . Rajan Revankar, MD  Any Other Special Instructions Will Be Listed Below (If Applicable).     

## 2018-05-24 ENCOUNTER — Other Ambulatory Visit: Payer: Self-pay | Admitting: Thoracic Surgery (Cardiothoracic Vascular Surgery)

## 2018-05-24 DIAGNOSIS — T8132XA Disruption of internal operation (surgical) wound, not elsewhere classified, initial encounter: Secondary | ICD-10-CM

## 2018-05-24 DIAGNOSIS — Z9889 Other specified postprocedural states: Secondary | ICD-10-CM

## 2018-05-27 ENCOUNTER — Other Ambulatory Visit: Payer: Self-pay

## 2018-05-27 ENCOUNTER — Ambulatory Visit
Admission: RE | Admit: 2018-05-27 | Discharge: 2018-05-27 | Disposition: A | Discharge status: Home / Self Care | Payer: Self-pay | Source: Ambulatory Visit | Attending: Thoracic Surgery (Cardiothoracic Vascular Surgery) | Admitting: Thoracic Surgery (Cardiothoracic Vascular Surgery)

## 2018-05-27 ENCOUNTER — Ambulatory Visit (INDEPENDENT_AMBULATORY_CARE_PROVIDER_SITE_OTHER): Payer: Self-pay | Admitting: Surgical

## 2018-05-27 ENCOUNTER — Ambulatory Visit: Payer: Self-pay | Admitting: Thoracic Surgery (Cardiothoracic Vascular Surgery)

## 2018-05-27 VITALS — BP 110/58 | HR 80 | Temp 97.3°F | Resp 16 | Ht 68.0 in | Wt 326.0 lb

## 2018-05-27 DIAGNOSIS — T8132XA Disruption of internal operation (surgical) wound, not elsewhere classified, initial encounter: Secondary | ICD-10-CM

## 2018-05-27 DIAGNOSIS — Z9889 Other specified postprocedural states: Secondary | ICD-10-CM

## 2018-05-27 DIAGNOSIS — T8131XD Disruption of external operation (surgical) wound, not elsewhere classified, subsequent encounter: Secondary | ICD-10-CM

## 2018-05-27 NOTE — Progress Notes (Signed)
[address]       [place] [address]             [phone]      SHELANE VERBECK [facility] Medical Record #[id number] Date of Birth: 1985/10/20  Referring: [name], MD Primary Care: Patient, No Pcp Per Primary Cardiologist: No primary care provider on file.   Chief Complaint:   POST OP FOLLOW UP OPERATIVE NOTE  Date of Procedure:                04/18/2018  Preoperative Diagnosis:      Severe Tricuspid Regurgitation  Postoperative Diagnosis:    Same  Procedure:        Tricuspid Valve Repair             Complex valvuloplasty including autologous pericardial patch augmentation of anterior and posterior leaflets             Artificial Gore-tex neochord placement x4             Edwards mc3 ring annuloplasty (size 30 mm, model #4900, serial #[id number])  Surgeon:        Salvatore Decent. 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 Cornelius Moras, MD  Anesthesia:                                        Arta Bruce, MD  Operative Findings:   ? Superficial wound dehiscence with fat necrosis ? No evidence of wound infection   History of Present Illness:   The patient is a 33 year old female status post the  above procedures seen in the office on today's date and routine follow-up.  She is currently getting her VAC changed q. Monday Wednesday Friday and is on the last day of her Keflex as the wound grew a few staph aureus.  She is having minimal discomfort and the discomfort she has is mostly associated with the tape on the VAC dressing.  She denies fevers, chills or other  constitutional symptoms.  She is not having any sternal clicking or popping sensation.  Overall she feels pretty well.      Past Medical History:  Diagnosis Date  . Anxiety    panic attacks  . Bipolar disorder (HCC)   . Drug abuse, IV (HCC)   . Dyspnea    when walking up stairs  . Gallstone   . GERD (gastroesophageal reflux disease)   . Heartburn   . Hepatitis C   . Periodontitis chronic, apical    dental caries  . Pre-diabetes   . PTSD (post-traumatic stress disorder)   . 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 Severe tricuspid regurgitation   . Wears glasses   . Wound infection after surgery    Sternal wound     Social History   Tobacco Use  Smoking Status Former Smoker  . Packs/day: 1.00  . Years: 20.00  . Pack years: 20.00  . Types: Cigarettes  . Last attempt to quit: 04/26/2017  . Years since quitting: 1.0  Smokeless Tobacco Never Used    Social History   Substance and Sexual Activity  Alcohol Use No     No Known Allergies  Current Outpatient Medications  Medication Sig Dispense Refill  . acetaminophen (TYLENOL) 325 MG tablet Take 2 tablets (650 mg total) by mouth every 4 (four) hours as needed for mild pain (or Fever >/= 101).    Marland Kitchen. aspirin EC 325 MG EC tablet Take 1 tablet (325 mg total) by mouth daily. 30 tablet 0  . ferrous fumarate-b12-vitamic C-folic acid (TRINSICON / FOLTRIN) capsule Take 1 capsule by mouth 2 (two) times  daily with a meal. 60 capsule 1  . ferrous sulfate 325 (65 FE) MG tablet Take 325 mg by mouth daily with breakfast.    . furosemide (LASIX) 40 MG tablet TAKE 1 TABLET (40 MG TOTAL) BY MOUTH DAILY. 90 tablet 0  . ibuprofen (ADVIL,MOTRIN) 200 MG tablet Take 600 mg by mouth every 6 (six) hours as needed.    . Multiple Vitamin (MULTIVITAMIN WITH MINERALS) TABS tablet Take 1 tablet by mouth daily.    Marland Kitchen. omeprazole (PRILOSEC) 20 MG capsule TAKE 1 CAPSULE (20 MG TOTAL) BY MOUTH 2 (TWO) TIMES DAILY BEFORE A MEAL. (Patient taking differently: Take 20 mg by mouth daily. ) 60 capsule 11  . potassium chloride (K-DUR,KLOR-CON) 10 MEQ tablet Take 1 tablet (10 mEq total) by mouth daily. 30 tablet 0  . methadone (DOLOPHINE) 10 MG/5ML solution Take 85 mg by mouth daily.      No current facility-administered medications for this visit.        Physical Exam: BP (!) 110/58 (BP Location: Right Arm, Patient Position: Sitting, Cuff Size: Large)   Pulse 80   Temp (!) 97.3 F (36.3 C) (Oral)   Resp 16   Ht 5\' 8"  (1.727 m)   Wt (!) 147.9 kg   SpO2 98% Comment: RA  BMI 49.57 kg/m   General appearance: alert, cooperative and no distress Wound: The wound is quite clean with 90% granulation tissue and a small amount of fibrinous/fatty necrosis.  I debrided a small area of this with scissors and she tolerated this well. There is no evidence of cellulitis and there is no purulence associated with the wound.  Diagnostic Studies & Laboratory data:     Recent Radiology Findings:   Dg  Chest 2 View  Result Date: 05/27/2018 CLINICAL DATA:  Hx heart surg 04/2018, sternal wound debridement 3/5/2020sternal wound dehiscence EXAM: CHEST - 2 VIEW COMPARISON:  05/13/2018 FINDINGS: Sternotomy wires overlie normal cardiac silhouette. No effusion, infiltrate or pneumothorax. The second and third most inferior sternotomy wire has been removed. IMPRESSION: No active cardiopulmonary disease. Electronically Signed   By: Genevive Bi M.D.   On: 05/27/2018 12:43      Recent Lab Findings: Lab Results  Component Value Date   WBC 9.0 05/15/2018   HGB 10.5 (L) 05/15/2018   HCT 32.7 (L) 05/15/2018   PLT 258 05/15/2018   GLUCOSE 89 05/15/2018   CHOL 214 (H) 02/26/2017   TRIG 184 (H) 02/26/2017   HDL 45 02/26/2017   LDLCALC 132 (H) 02/26/2017   ALT 14 05/15/2018   AST 20 05/15/2018   NA 136 05/15/2018   K 4.0 05/15/2018   CL 103 05/15/2018   CREATININE 0.76 05/15/2018   BUN 8 05/15/2018   CO2 24 05/15/2018   INR 1.35 04/18/2018   HGBA1C 5.8 (H) 04/15/2018      Assessment / Plan: Patient is doing well.  She will continue her current VAc  schedule of Monday Wednesday Friday and we will see again in 2 weeks.  She knows to contact us prior to that if she has any new difficulties.  I reviewed her chest x-ray and is stable in appearance.  I have made no changes to her current medicine regimen.          Rowe Clack, PA-C 05/27/2018 1:40 PM

## 2018-05-27 NOTE — Patient Instructions (Signed)
Continue current management

## 2018-06-07 ENCOUNTER — Other Ambulatory Visit: Payer: Self-pay | Admitting: Thoracic Surgery (Cardiothoracic Vascular Surgery)

## 2018-06-07 DIAGNOSIS — T8132XA Disruption of internal operation (surgical) wound, not elsewhere classified, initial encounter: Secondary | ICD-10-CM

## 2018-06-10 ENCOUNTER — Ambulatory Visit: Payer: Self-pay | Admitting: Thoracic Surgery (Cardiothoracic Vascular Surgery)

## 2018-06-14 ENCOUNTER — Other Ambulatory Visit: Payer: Self-pay | Admitting: Cardiology

## 2018-06-14 ENCOUNTER — Telehealth: Payer: Self-pay | Admitting: Cardiology

## 2018-06-14 MED ORDER — POTASSIUM CHLORIDE CRYS ER 10 MEQ PO TBCR: 10.0000 meq | EXTENDED_RELEASE_TABLET | Freq: Every day | ORAL | 1 refills | Status: DC

## 2018-06-14 MED ORDER — FUROSEMIDE 40 MG PO TABS: 40.0000 mg | ORAL_TABLET | Freq: Every day | ORAL | 1 refills | Status: DC

## 2018-06-14 NOTE — Telephone Encounter (Signed)
°*  STAT* If patient is at the pharmacy, call can be transferred to refill team.   1. Which medications need to be refilled? (please list name of each medication and dose if known) Furosemide 40mg , and Potassium chloride tablet  2. Which pharmacy/location (including street and city if local pharmacy) is medication to be sent to?[facility] [place]  3. Do they need a 30 day or 90 day supply? 90

## 2018-07-01 ENCOUNTER — Ambulatory Visit: Payer: Self-pay | Admitting: Thoracic Surgery (Cardiothoracic Vascular Surgery)

## 2018-07-15 ENCOUNTER — Ambulatory Visit: Payer: Self-pay | Admitting: Thoracic Surgery (Cardiothoracic Vascular Surgery)

## 2018-08-16 ENCOUNTER — Other Ambulatory Visit: Payer: Self-pay | Admitting: Thoracic Surgery (Cardiothoracic Vascular Surgery)

## 2018-08-16 DIAGNOSIS — Z9889 Other specified postprocedural states: Secondary | ICD-10-CM

## 2018-08-19 ENCOUNTER — Ambulatory Visit: Payer: Self-pay | Admitting: Thoracic Surgery (Cardiothoracic Vascular Surgery)

## 2018-08-27 ENCOUNTER — Telehealth: Payer: Self-pay | Admitting: Cardiology

## 2018-08-27 NOTE — Telephone Encounter (Signed)
Virtual Visit Pre-Appointment Phone Call  "(Name), I am calling you today to discuss your upcoming appointment. We are currently trying to limit exposure to the virus that causes COVID-19 by seeing patients at home rather than in the office."  1. "What is the BEST phone number to call the day of the visit?" - include this in appointment notes  2. Do you have or have access to (through a family member/friend) a smartphone with video capability that we can use for your visit?" a. If yes - list this number in appt notes as cell (if different from BEST phone #) and list the appointment type as a VIDEO visit in appointment notes b. If no - list the appointment type as a PHONE visit in appointment notes  3. Confirm consent - "In the setting of the current Covid19 crisis, you are scheduled for a (phone or video) visit with your provider on (date) at (time).  Just as we do with many in-office visits, in order for you to participate in this visit, we must obtain consent.  If you'd like, I can send this to your mychart (if signed up) or email for you to review.  Otherwise, I can obtain your verbal consent now.  All virtual visits are billed to your insurance company just like a normal visit would be.  By agreeing to a virtual visit, we'd like you to understand that the technology does not allow for your provider to perform an examination, and thus may limit your provider's ability to fully assess your condition. If your provider identifies any concerns that need to be evaluated in person, we will make arrangements to do so.  Finally, though the technology is pretty good, we cannot assure that it will always work on either your or our end, and in the setting of a video visit, we may have to convert it to a phone-only visit.  In either situation, we cannot ensure that we have a secure connection.  Are you willing to proceed?" STAFF: Did the patient verbally acknowledge consent to telehealth visit? Document  YES/NO here: YES  4. Advise patient to be prepared - "Two hours prior to your appointment, go ahead and check your blood pressure, pulse, oxygen saturation, and your weight (if you have the equipment to check those) and write them all down. When your visit starts, your provider will ask you for this information. If you have an Apple Watch or Kardia device, please plan to have heart rate information ready on the day of your appointment. Please have a pen and paper handy nearby the day of the visit as well."  5. Give patient instructions for MyChart download to smartphone OR Doximity/Doxy.me as below if video visit (depending on what platform provider is using)  6. Inform patient they will receive a phone call 15 minutes prior to their appointment time (may be from unknown caller ID) so they should be prepared to answer    TELEPHONE CALL NOTE  Fergie E Pensabene has been deemed a candidate for a follow-up tele-health visit to limit community exposure during the Covid-19 pandemic. I spoke with the patient via phone to ensure availability of phone/video source, confirm preferred email & phone number, and discuss instructions and expectations.  I reminded Ryeleigh E Socarras to be prepared with any vital sign and/or heart rhythm information that could potentially be obtained via home monitoring, at the time of her visit. I reminded Bobette Mo to expect a phone call prior to  her visit.  Kittie Platerdrienne C Troutman 08/27/2018 10:06 AM   INSTRUCTIONS FOR DOWNLOADING THE MYCHART APP TO SMARTPHONE  - The patient must first make sure to have activated MyChart and know their login information - If Apple, go to [employer] Store and type in MyChart in the search bar and download the app. If Android, ask patient to go to [employer] Play Store and type in [place]MyChart in the search bar and download the app. The app is free but as with any other app downloads, their phone may require them to verify saved payment information or Apple/Android  password.  - The patient will need to then log into the app with their MyChart username and password, and select [facility] as their healthcare provider to link the account. When it is time for your visit, go to the MyChart app, find appointments, and click Begin Video Visit. Be sure to Select Allow for your device to access the Microphone and Camera for your visit. You will then be connected, and your provider will be with you shortly.  **If they have any issues connecting, or need assistance please contact MyChart service desk [phone])**  **If using a computer, in order to ensure the best quality for their visit they will need to use either of the following Internet Browsers: [employer] Edge, or Google Chrome**  IF USING DOXIMITY or DOXY.ME - The patient will receive a link just prior to their visit by text.     FULL LENGTH CONSENT FOR TELE-HEALTH VISIT   I hereby voluntarily request, consent and authorize [facility] and its employed or contracted physicians, physician assistants, nurse practitioners or other licensed health care professionals (the Practitioner), to provide me with telemedicine health care services (the Services") as deemed necessary by the treating Practitioner. I acknowledge and consent to receive the Services by the Practitioner via telemedicine. I understand that the telemedicine visit will involve communicating with the Practitioner through live audiovisual communication technology and the disclosure of certain medical information by electronic transmission. I acknowledge that I have been given the opportunity to request an in-person assessment or other available alternative prior to the telemedicine visit and am voluntarily participating in the telemedicine visit.  I understand that I have the right to withhold or withdraw my consent to the use of telemedicine in the course of my care at any time, without affecting my right to future care or treatment,  and that the Practitioner or I may terminate the telemedicine visit at any time. I understand that I have the right to inspect all information obtained and/or recorded in the course of the telemedicine visit and may receive copies of available information for a reasonable fee.  I understand that some of the potential risks of receiving the Services via telemedicine include:   Delay or interruption in medical evaluation due to technological equipment failure or disruption;  Information transmitted may not be sufficient (e.g. poor resolution of images) to allow for appropriate medical decision making by the Practitioner; and/or   In rare instances, security protocols could fail, causing a breach of personal health information.  Furthermore, I acknowledge that it is my responsibility to provide information about my medical history, conditions and care that is complete and accurate to the best of my ability. I acknowledge that Practitioner's advice, recommendations, and/or decision may be based on factors not within their control, such as incomplete or inaccurate data provided by me or distortions of diagnostic images or specimens that may result from electronic transmissions. I  understand that the practice of medicine is not an exact science and that Practitioner makes no warranties or guarantees regarding treatment outcomes. I acknowledge that I will receive a copy of this consent concurrently upon execution via email to the email address I last provided but may also request a printed copy by calling the office of [place].    I understand that my insurance will be billed for this visit.   I have read or had this consent read to me.  I understand the contents of this consent, which adequately explains the benefits and risks of the Services being provided via telemedicine.   I have been provided ample opportunity to ask questions regarding this consent and the Services and have had my questions  answered to my satisfaction.  I give my informed consent for the services to be provided through the use of telemedicine in my medical care  By participating in this telemedicine visit I agree to the above.

## 2018-09-02 ENCOUNTER — Encounter: Payer: Self-pay | Admitting: Cardiology

## 2018-09-02 ENCOUNTER — Other Ambulatory Visit: Payer: Self-pay

## 2018-09-02 ENCOUNTER — Telehealth (INDEPENDENT_AMBULATORY_CARE_PROVIDER_SITE_OTHER): Payer: Self-pay | Admitting: Cardiology

## 2018-09-02 DIAGNOSIS — Z9889 Other specified postprocedural states: Secondary | ICD-10-CM

## 2018-09-02 DIAGNOSIS — T8132XD Disruption of internal operation (surgical) wound, not elsewhere classified, subsequent encounter: Secondary | ICD-10-CM

## 2018-09-02 DIAGNOSIS — F1911 Other psychoactive substance abuse, in remission: Secondary | ICD-10-CM

## 2018-09-02 NOTE — Addendum Note (Signed)
Addended by: Ashok Norris on: 09/02/2018 02:57 PM   Modules accepted: Orders

## 2018-09-02 NOTE — Progress Notes (Signed)
Virtual Visit via Video Note   This visit type was conducted due to national recommendations for restrictions regarding the COVID-19 Pandemic (e.g. social distancing) in an effort to limit this patient's exposure and mitigate transmission in our community.  Due to her co-morbid illnesses, this patient is at least at moderate risk for complications without adequate follow up.  This format is felt to be most appropriate for this patient at this time.  All issues noted in this document were discussed and addressed.  A limited physical exam was performed with this format.  Please refer to the patient's chart for her consent to tele[facility].  Evaluation Performed:  Follow-up visit  This visit type was conducted due to national recommendations for restrictions regarding the COVID-19 Pandemic (e.g. social distancing).  This format is felt to be most appropriate for this patient at this time.  All issues noted in this document were discussed and addressed.  No physical exam was performed (except for noted visual exam findings with Video Visits).  Please refer to the patient's chart (MyChart message for video visits and phone note for telephone visits) for the patient's consent to telehealth [facility].  Date:  09/02/2018  ID: Heather Ammonsshly E Chaffin, DOB 02/02/1986, MRN [id number]   Patient Location: [id number] [place] [id number]   [facility]:   [facility] [place] [facility]  PCP:  Patient, No Pcp Per  Cardiologist:  Gypsy Balsamobert Krasowski, MD     Chief Complaint: Doing very well  History of Present Illness:    Heather Day is a 33 y.o. female  who presents via audio/video conferencing for a telehealth visit today.  Status post complex tricuspid valve repair, diabetes, history of drug abuse, anxiety and panic attacks.  She does have a video visit with me today.  Surgery was complicated by a wound dehiscence but that is getting better.  She is doing well denies  have any chest pain tightness squeezing pressure burning chest.  I talked to her over the video link into matter-of-fact she just come back from walk that she did for about half an hour outside so she is sweaty and tired but overall happy that she was able to accomplish her tasks.  Denies have any chest pain tightness squeezing pressure been chest complain of having some exertional shortness of breath but in spite of that he is able to walk on the regular basis.  She is scheduled to see CT surgery on Monday.  She does have still nonhealing wound in the sternum.  But denies have any fever chills overall.  She said she scratched the wound at night and that is what make it worse.   The patient does not have symptoms concerning for COVID-19 infection (fever, chills, cough, or new SHORTNESS OF BREATH).    Prior CV studies:   The following studies were reviewed today:       Past Medical History:  Diagnosis Date  . Anxiety    panic attacks  . Bipolar disorder (HCC)   . Drug abuse, IV (HCC)   . Dyspnea    when walking up stairs  . Gallstone   . GERD (gastroesophageal reflux disease)   . Heartburn   . Hepatitis C   . Periodontitis chronic, apical    dental caries  . Pre-diabetes   . PTSD (post-traumatic stress disorder)   . 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 Severe tricuspid regurgitation   . Wears glasses   . Wound infection after surgery    Sternal wound    Past Surgical History:  Procedure Laterality Date  . APPENDECTOMY    . APPLICATION OF WOUND VAC N/A 05/16/2018   Procedure: APPLICATION OF WOUND VAC;  Surgeon: Rexene Alberts, MD;  Location: [place];  Service: Thoracic;  Laterality: N/A;  . CESAREAN SECTION    . CHOLECYSTECTOMY N/A 01/15/2018   Procedure: LAPAROSCOPIC CHOLECYSTECTOMY ERAS PATHWAY;  Surgeon:  Excell Seltzer, MD;  Location: [place];  Service: General;  Laterality: N/A;  . INTRAOPERATIVE CHOLANGIOGRAM N/A 01/15/2018   Procedure: INTRAOPERATIVE CHOLANGIOGRAM;  Surgeon: Excell Seltzer, MD;  Location: [place];  Service: General;  Laterality: N/A;  . LAPAROSCOPIC APPENDECTOMY N/A 07/04/2017   Procedure: APPENDECTOMY LAPAROSCOPIC;  Surgeon: Johnathan Hausen, MD;  Location: [facility];  Service: General;  Laterality: N/A;  . MULTIPLE EXTRACTIONS WITH ALVEOLOPLASTY Bilateral 11/29/2017   Procedure: Extraction of tooth #'s 2,5-15, and 31 with alveoloplasty and gross debridement of remaining dentition;  Surgeon: Lenn Cal, DDS;  Location: [place];  Service: Oral Surgery;  Laterality: Bilateral;  . STERNAL WOUND DEBRIDEMENT N/A 05/16/2018   Procedure: STERNAL WOUND DEBRIDEMENT with Sternal wire removal x2.;  Surgeon: Rexene Alberts, MD;  Location: [place];  Service: Thoracic;  Laterality: N/A;  . TEE WITHOUT CARDIOVERSION N/A 10/03/2017   Procedure: TRANSESOPHAGEAL ECHOCARDIOGRAM (TEE);  Surgeon: Buford Dresser, MD;  Location: [facility];  Service: Cardiovascular;  Laterality: N/A;  . TEE WITHOUT CARDIOVERSION N/A 04/18/2018   Procedure: TRANSESOPHAGEAL ECHOCARDIOGRAM (TEE);  Surgeon: Rexene Alberts, MD;  Location: [place];  Service: Open Heart Surgery;  Laterality: N/A;  . TRICUSPID VALVE REPLACEMENT N/A 04/18/2018   Procedure: TRICUSPID VALVE REPAIR;  Surgeon: Rexene Alberts, MD;  Location: [facility];  Service: Open Heart Surgery;  Laterality: N/A;     Current Meds  Medication Sig  . acetaminophen (TYLENOL) 325 MG tablet Take 2 tablets (650 mg total) by mouth every 4 (four) hours as needed for mild pain (or Fever >/= 101).  Marland Kitchen aspirin EC 325 MG EC tablet Take 1 tablet (325 mg total) by mouth daily.  . furosemide (LASIX) 40 MG tablet Take 1 tablet (40 mg total) by mouth daily.  . methadone (DOLOPHINE) 10 MG/5ML solution Take 85 mg by mouth daily.   . Multiple Vitamin (MULTIVITAMIN WITH  MINERALS) TABS tablet Take 1 tablet by mouth daily.  Marland Kitchen omeprazole (PRILOSEC) 20 MG capsule TAKE 1 CAPSULE (20 MG TOTAL) BY MOUTH 2 (TWO) TIMES DAILY BEFORE A MEAL. (Patient taking differently: Take 20 mg by mouth daily. )  . potassium chloride (K-DUR,KLOR-CON) 10 MEQ tablet Take 1 tablet (10 mEq total) by mouth daily.      Family History: The patient's family history includes Healthy in her father; Hypotension in her mother; Hypothyroidism in her mother.   ROS:   Please see the history of present illness.     All other systems reviewed and are negative.   Labs/Other Tests and Data Reviewed:     Recent Labs: 09/04/2017: NT-Pro BNP 242 04/19/2018: Magnesium 2.0 05/15/2018: ALT 14; BUN 8; Creatinine, Ser 0.76; Hemoglobin 10.5; Platelets 258; Potassium 4.0; Sodium 136  Recent Lipid Panel    Component Value Date/Time   CHOL 214 (H) 02/26/2017 1425   TRIG 184 (H) 02/26/2017 1425   HDL 45 02/26/2017 1425   CHOLHDL 4.8 (H)  02/26/2017 1425   LDLCALC 132 (H) 02/26/2017 1425      Exam:    Vital Signs:  There were no vitals taken for this visit.    Wt Readings from Last 3 Encounters:  05/27/18 (!) 326 lb (147.9 kg)  05/21/18 (!) 324 lb (147 kg)  05/17/18 (!) 328 lb 6.4 oz (149 kg)     Well nourished, well developed in no acute distress. Alert awake in exam 3 happy to be able to do to have video conference with me.  Denies having any cardiac complaints.  Except shortness of breath  Diagnosis for this visit:   1. S/P tricuspid valve repair   2. History of drug abuse (HCC)   3. Sternal wound dehiscence, subsequent encounter      ASSESSMENT & PLAN:    1.  Status post complex tricuspid valve repair.  Recovering.  I will schedule her to have echocardiogram.  Anticipate echocardiogram to be done in about a month. 2.  History of drug abuse and she is a success story she is clean she does not use drugs anymore and doing well.  She is struggling weight gain right now and she is trying  to eat better and exercise more regularly so she can lose some weight and I congratulated her for weight and encouraged her to do that 3.  Sternal wound dehiscence.  Follow-up by CT.  Getting better but still open  COVID-19 Education: The signs and symptoms of COVID-19 were discussed with the patient and how to seek care for testing (follow up with PCP or arrange E-visit).  The importance of social distancing was discussed today.  Patient Risk:   After full review of this patients clinical status, I feel that they are at least moderate risk at this time.  Time:   Today, I have spent 15 minutes with the patient with telehealth technology discussing pt health issues.  I spent 5 minutes reviewing her chart before the visit.  Visit was finished at 2:16 PM.    Medication Adjustments/Labs and Tests Ordered: Current medicines are reviewed at length with the patient today.  Concerns regarding medicines are outlined above.  No orders of the defined types were placed in this encounter.  Medication changes: No orders of the defined types were placed in this encounter.    Disposition: Follow-up 4 months  Signed, Georgeanna Leaobert J. Krasowski, MD, [facility] 09/02/2018 2:16 PM    [facility]

## 2018-09-02 NOTE — Patient Instructions (Signed)
Medication Instructions:  Your physician recommends that you continue on your current medications as directed. Please refer to the Current Medication list given to you today.   If you need a refill on your cardiac medications before your next appointment, please call your pharmacy.   Lab work: None.  If you have labs (blood work) drawn today and your tests are completely normal, you will receive your results only by: . MyChart Message (if you have MyChart) OR . A paper copy in the mail If you have any lab test that is abnormal or we need to change your treatment, we will call you to review the results.  Testing/Procedures: Your physician has requested that you have an echocardiogram. Echocardiography is a painless test that uses sound waves to create images of your heart. It provides your doctor with information about the size and shape of your heart and how well your heart's chambers and valves are working. This procedure takes approximately one hour. There are no restrictions for this procedure.    Follow-Up: At [facility], you and your health needs are our priority.  As part of our continuing mission to provide you with exceptional heart care, we have created designated Provider Care Teams.  These Care Teams include your primary Cardiologist (physician) and Advanced Practice Providers (APPs -  Physician Assistants and Nurse Practitioners) who all work together to provide you with the care you need, when you need it. You will need a follow up appointment in 4 months.  Please call our office 2 months in advance to schedule this appointment.  You may see No primary care provider on file. or another member of our [facility] Provider Team in : Brian Munley, MD . Rajan Revankar, MD  Any Other Special Instructions Will Be Listed Below (If Applicable).     Echocardiogram An echocardiogram is a procedure that uses painless sound waves (ultrasound) to produce an image of the  heart. Images from an echocardiogram can provide important information about:  Signs of coronary artery disease (CAD).  Aneurysm detection. An aneurysm is a weak or damaged part of an artery wall that bulges out from the normal force of blood pumping through the body.  Heart size and shape. Changes in the size or shape of the heart can be associated with certain conditions, including heart failure, aneurysm, and CAD.  Heart muscle function.  Heart valve function.  Signs of a past heart attack.  Fluid buildup around the heart.  Thickening of the heart muscle.  A tumor or infectious growth around the heart valves. Tell a health care provider about:  Any allergies you have.  All medicines you are taking, including vitamins, herbs, eye drops, creams, and over-the-counter medicines.  Any blood disorders you have.  Any surgeries you have had.  Any medical conditions you have.  Whether you are pregnant or may be pregnant. What are the risks? Generally, this is a safe procedure. However, problems may occur, including:  Allergic reaction to dye (contrast) that may be used during the procedure. What happens before the procedure? No specific preparation is needed. You may eat and drink normally. What happens during the procedure?   An IV tube may be inserted into one of your veins.  You may receive contrast through this tube. A contrast is an injection that improves the quality of the pictures from your heart.  A gel will be applied to your chest.  A wand-like tool (transducer) will be moved over your chest. The gel   will help to transmit the sound waves from the transducer.  The sound waves will harmlessly bounce off of your heart to allow the heart images to be captured in real-time motion. The images will be recorded on a computer. The procedure may vary among health care providers and hospitals. What happens after the procedure?  You may return to your normal, everyday  life, including diet, activities, and medicines, unless your health care provider tells you not to do that. Summary  An echocardiogram is a procedure that uses painless sound waves (ultrasound) to produce an image of the heart.  Images from an echocardiogram can provide important information about the size and shape of your heart, heart muscle function, heart valve function, and fluid buildup around your heart.  You do not need to do anything to prepare before this procedure. You may eat and drink normally.  After the echocardiogram is completed, you may return to your normal, everyday life, unless your health care provider tells you not to do that. This information is not intended to replace advice given to you by your health care provider. Make sure you discuss any questions you have with your health care provider. Document Released: 02/25/2000 Document Revised: 04/01/2016 Document Reviewed: 04/01/2016 Elsevier Interactive Patient Education  2019 [employer]  

## 2018-09-09 ENCOUNTER — Other Ambulatory Visit: Payer: Self-pay

## 2018-09-09 ENCOUNTER — Ambulatory Visit (INDEPENDENT_AMBULATORY_CARE_PROVIDER_SITE_OTHER): Payer: Self-pay | Admitting: Thoracic Surgery (Cardiothoracic Vascular Surgery)

## 2018-09-09 ENCOUNTER — Ambulatory Visit
Admission: RE | Admit: 2018-09-09 | Discharge: 2018-09-09 | Disposition: A | Discharge status: Home / Self Care | Payer: Self-pay | Source: Ambulatory Visit | Attending: Thoracic Surgery (Cardiothoracic Vascular Surgery) | Admitting: Thoracic Surgery (Cardiothoracic Vascular Surgery)

## 2018-09-09 ENCOUNTER — Encounter: Payer: Self-pay | Admitting: Thoracic Surgery (Cardiothoracic Vascular Surgery)

## 2018-09-09 VITALS — BP 104/72 | HR 80 | Temp 97.7°F | Resp 16 | Ht 68.0 in | Wt 325.0 lb

## 2018-09-09 DIAGNOSIS — Z4889 Encounter for other specified surgical aftercare: Secondary | ICD-10-CM

## 2018-09-09 DIAGNOSIS — Z9889 Other specified postprocedural states: Secondary | ICD-10-CM

## 2018-09-09 NOTE — Progress Notes (Signed)
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  Her early postoperative recovery was uneventful but she developed superficial wound dehiscence secondary to fat necrosis which required local surgical debridement and placement of wound VAC.  Her wound slowly healed and she returns her office today for follow-up.  She states that she was seen recently by Dr. Agustin Cree and a follow-up echocardiogram has been planned for later this week.  She reports that she is doing well although she does feel tired.  She has not been smoking cigarettes for a long time.  She has been watching her diet very carefully and trying to exercise on a daily basis.  She has already lost 8 pounds in weight.  She denies any shortness of breath.  She states that she does feel like she is tired and has to nap every afternoon.  She states that her surgical incision finally healed completely.  She no longer has any pain in her chest at all.   Current Outpatient Medications  Medication Sig Dispense Refill  . acetaminophen (TYLENOL) 325 MG tablet Take 2 tablets (650 mg total) by mouth every 4 (four) hours as needed for mild pain (or Fever >/= 101).    Marland Kitchen aspirin EC 325 MG EC tablet Take 1 tablet (325 mg total) by mouth daily. 30 tablet 0  . furosemide (LASIX) 40 MG tablet Take 1 tablet (40 mg total) by mouth daily. 90 tablet 1  . ibuprofen (ADVIL,MOTRIN) 200 MG tablet Take 600 mg by mouth every 6 (six) hours as needed.    . methadone (DOLOPHINE) 10 MG/5ML solution Take 85 mg by mouth daily.     . Multiple  Vitamin (MULTIVITAMIN WITH MINERALS) TABS tablet Take 1 tablet by mouth daily.    Marland Kitchen omeprazole (PRILOSEC) 20 MG capsule TAKE 1 CAPSULE (20 MG TOTAL) BY MOUTH 2 (TWO) TIMES DAILY BEFORE A MEAL. (Patient taking differently: Take 20 mg by mouth daily. ) 60 capsule 11  . potassium chloride (K-DUR,KLOR-CON) 10 MEQ tablet Take 1 tablet (10 mEq total) by mouth daily. 90 tablet 1   No current facility-administered medications for this visit.       Physical Exam:   BP 104/72 (BP Location: Right Arm, Patient Position: Sitting, Cuff Size: Large)   Pulse 80   Temp 97.7 F (36.5 C) Comment: thermal  Resp 16   Ht 5\' 8"  (1.727 m)   Wt (!) 325 lb (147.4 kg)   SpO2 96% Comment: RA  BMI 49.42 kg/m   General:  Well-appearing  Chest:   Clear to auscultation  CV:   Regular rate and rhythm without murmur  Incisions:  Completely healed, sternum is stable  Abdomen:  Soft nontender  Extremities:  Warm and well-perfused  Diagnostic Tests:  n/a   Impression:  Patient is doing well and her surgical incision is finally healed completely.  Plan:  We have not recommended any change the patient's current medications.  We look forward to reviewing the patient's planned routine follow-up echocardiogram.  I have encouraged the patient to continue to increase her activity without any particular limitations.  All of her questions have been addressed.  The patient will continue to follow-up with Dr. Krasowski and return to [facility] next February for routine follow-up approximately 1 year following her surgery.  I spent in excess of 10 minutes during the conduct of this office consultation and >50% of this time involved direct face-to-face encounter with the patient for counseling and/or coordination of their care.    Salvatore Decentlarence H. Cornelius Moraswen, MD 09/09/2018 4:07 PM

## 2018-09-09 NOTE — Patient Instructions (Signed)

## 2018-09-10 ENCOUNTER — Ambulatory Visit (HOSPITAL_BASED_OUTPATIENT_CLINIC_OR_DEPARTMENT_OTHER)
Admission: RE | Admit: 2018-09-10 | Discharge: 2018-09-10 | Disposition: A | Discharge status: Home / Self Care | Payer: Self-pay | Source: Ambulatory Visit | Attending: Cardiology | Admitting: Cardiology

## 2018-09-10 DIAGNOSIS — Z9889 Other specified postprocedural states: Secondary | ICD-10-CM | POA: Insufficient documentation

## 2018-09-10 NOTE — Progress Notes (Signed)
  Echocardiogram 2D Echocardiogram has been performed.    Heather Day 09/10/2018, 11:35 AM

## 2018-12-20 ENCOUNTER — Ambulatory Visit: Payer: Self-pay | Admitting: Cardiology

## 2018-12-27 ENCOUNTER — Encounter: Payer: Self-pay | Admitting: Cardiology

## 2018-12-27 ENCOUNTER — Other Ambulatory Visit: Payer: Self-pay

## 2018-12-27 ENCOUNTER — Ambulatory Visit (INDEPENDENT_AMBULATORY_CARE_PROVIDER_SITE_OTHER): Payer: Self-pay | Admitting: Cardiology

## 2018-12-27 VITALS — BP 102/62 | HR 68 | Ht 68.0 in | Wt 336.0 lb

## 2018-12-27 DIAGNOSIS — F1911 Other psychoactive substance abuse, in remission: Secondary | ICD-10-CM

## 2018-12-27 DIAGNOSIS — Z9889 Other specified postprocedural states: Secondary | ICD-10-CM

## 2018-12-27 DIAGNOSIS — T8132XD Disruption of internal operation (surgical) wound, not elsewhere classified, subsequent encounter: Secondary | ICD-10-CM

## 2018-12-27 NOTE — Patient Instructions (Signed)
Medication Instructions:  Your physician recommends that you continue on your current medications as directed. Please refer to the Current Medication list given to you today.  *If you need a refill on your cardiac medications before your next appointment, please call your pharmacy*  Lab Work: None Ordered  If you have labs (blood work) drawn today and your tests are completely normal, you will receive your results only by: Marland Kitchen MyChart Message (if you have MyChart) OR . A paper copy in the mail If you have any lab test that is abnormal or we need to change your treatment, we will call you to review the results.  Testing/Procedures: None Ordered  Follow-Up: At [facility], you and your health needs are our priority.  As part of our continuing mission to provide you with exceptional heart care, we have created designated Provider Care Teams.  These Care Teams include your primary Cardiologist (physician) and Advanced Practice Providers (APPs -  Physician Assistants and Nurse Practitioners) who all work together to provide you with the care you need, when you need it.  Your next appointment:   6 months  The format for your next appointment:   In Person  Provider:   You may see Dr. Agustin Cree  or the following Advanced Practice Provider on your designated Care Team:    Laurann Montana, FNP   Other Instructions

## 2018-12-27 NOTE — Progress Notes (Signed)
Cardiology Office Note:    Date:  12/27/2018   ID:  Heather Day, DOB 03/02/1986, MRN [id number]  PCP:  Patient, No Pcp Per  Cardiologist:  [name], MD    Referring MD: No ref. provider found   Chief Complaint  Patient presents with  . Follow-up  Doing very well  History of Present Illness:    Heather Day is a 33 y.o. female with past medical history significant for tricuspid valve repair, history of drug abuse but she has been clean for years.  Overall cardiac wise doing well.  She is struggling with weight loss and we spent majority of them during the visit today to talk about techniques that she can use to try to reduce her weight.  She is frustrated with the fact that she walk on a regular basis trying to watch how she eats but still have difficulty losing weight we talked about low carbs diet with ability to exercise we talked about potentially sending her to dietitian however she does not have any insurance and she has no money to pay for it.  Past Medical History:  Diagnosis Date  . Anxiety    panic attacks  . Bipolar disorder (HCC)   . Drug abuse, IV (HCC)   . Dyspnea    when walking up stairs  . Gallstone   . GERD (gastroesophageal reflux disease)   . Heartburn   . Hepatitis C   . Periodontitis chronic, apical    dental caries  . Pre-diabetes   . PTSD (post-traumatic stress disorder)   . 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 Severe tricuspid regurgitation   . Wears glasses   . Wound infection after surgery    Sternal wound    Past Surgical History:  Procedure Laterality Date  . APPENDECTOMY    . APPLICATION OF WOUND VAC N/A 05/16/2018   Procedure: APPLICATION OF WOUND VAC;  Surgeon: Purcell Nails, MD;  Location: [facility] OR;  Service: Thoracic;  Laterality: N/A;  . CESAREAN SECTION     . CHOLECYSTECTOMY N/A 01/15/2018   Procedure: LAPAROSCOPIC CHOLECYSTECTOMY ERAS PATHWAY;  Surgeon: Glenna Fellows, MD;  Location: [facility] OR;  Service: General;  Laterality: N/A;  . INTRAOPERATIVE CHOLANGIOGRAM N/A 01/15/2018   Procedure: INTRAOPERATIVE CHOLANGIOGRAM;  Surgeon: Glenna Fellows, MD;  Location: [facility] OR;  Service: General;  Laterality: N/A;  . LAPAROSCOPIC APPENDECTOMY N/A 07/04/2017   Procedure: APPENDECTOMY LAPAROSCOPIC;  Surgeon: Luretha Murphy, MD;  Location: [facility] ORS;  Service: General;  Laterality: N/A;  . MULTIPLE EXTRACTIONS WITH ALVEOLOPLASTY Bilateral 11/29/2017   Procedure: Extraction of tooth #'s 2,5-15, and 31 with alveoloplasty and gross debridement of remaining dentition;  Surgeon: Charlynne Pander, DDS;  Location: [facility] OR;  Service: Oral Surgery;  Laterality: Bilateral;  . STERNAL WOUND DEBRIDEMENT N/A 05/16/2018   Procedure: STERNAL WOUND DEBRIDEMENT with Sternal wire removal x2.;  Surgeon: Purcell Nails, MD;  Location: [facility] OR;  Service: Thoracic;  Laterality: N/A;  . TEE WITHOUT CARDIOVERSION N/A 10/03/2017   Procedure: TRANSESOPHAGEAL ECHOCARDIOGRAM (TEE);  Surgeon: Jodelle Red, MD;  Location[facility] ENDOSCOPY;  Service: Cardiovascular;  Laterality: N/A;  . TEE WITHOUT CARDIOVERSION N/A 04/18/2018   Procedure: TRANSESOPHAGEAL ECHOCARDIOGRAM (TEE);  Surgeon: Purcell Nails, MD;  Location: [facility] OR;  Service: Open Heart Surgery;  Laterality: N/A;  . TRICUSPID VALVE REPLACEMENT N/A 04/18/2018   Procedure: TRICUSPID  VALVE REPAIR;  Surgeon: Rexene Alberts, MD;  Location: [facility];  Service: Open Heart Surgery;  Laterality: N/A;    Current Medications: Current Meds  Medication Sig  . acetaminophen (TYLENOL) 325 MG tablet Take 2 tablets (650 mg total) by mouth every 4 (four) hours as needed for mild pain (or Fever >/= 101).  Marland Kitchen aspirin EC 325 MG EC tablet Take 1 tablet (325 mg total) by mouth daily.  . furosemide (LASIX) 40 MG tablet Take 1 tablet (40 mg total) by mouth  daily.  Marland Kitchen ibuprofen (ADVIL,MOTRIN) 200 MG tablet Take 600 mg by mouth every 6 (six) hours as needed.  . methadone (DOLOPHINE) 10 MG/5ML solution Take 85 mg by mouth daily.   . Multiple Vitamin (MULTIVITAMIN WITH MINERALS) TABS tablet Take 1 tablet by mouth daily.  Marland Kitchen omeprazole (PRILOSEC) 20 MG capsule TAKE 1 CAPSULE (20 MG TOTAL) BY MOUTH 2 (TWO) TIMES DAILY BEFORE A MEAL. (Patient taking differently: Take 20 mg by mouth daily. )  . potassium chloride (K-DUR,KLOR-CON) 10 MEQ tablet Take 1 tablet (10 mEq total) by mouth daily.     Allergies:   Patient has no known allergies.   Social History   Socioeconomic History  . Marital status: Single    Spouse name: Not on file  . Number of children: Not on file  . Years of education: Not on file  . Highest education level: Not on file  Occupational History  . Not on file  Social Needs  . Financial resource strain: Not on file  . Food insecurity    Worry: Not on file    Inability: Not on file  . Transportation needs    Medical: Not on file    Non-medical: Not on file  Tobacco Use  . Smoking status: Former Smoker    Packs/day: 1.00    Years: 20.00    Pack years: 20.00    Types: Cigarettes    Quit date: 04/26/2017    Years since quitting: 1.6  . Smokeless tobacco: Never Used  Substance and Sexual Activity  . Alcohol use: No  . Drug use: Not Currently    Types: IV, Heroin    Comment: clean since 10/09/16  . Sexual activity: Not on file  Lifestyle  . Physical activity    Days per week: Not on file    Minutes per session: Not on file  . Stress: Not on file  Relationships  . Social Herbalist on phone: Not on file    Gets together: Not on file    Attends religious service: Not on file    Active member of club or organization: Not on file    Attends meetings of clubs or organizations: Not on file    Relationship status: Not on file  Other Topics Concern  . Not on file  Social History Narrative  . Not on file      Family History: The patient's family history includes Healthy in her father; Hypotension in her mother; Hypothyroidism in her mother. ROS:   Please see the history of present illness.    All 14 point review of systems negative except as described per history of present illness  EKGs/Labs/Other Studies Reviewed:      Recent Labs: 04/19/2018: Magnesium 2.0 05/15/2018: ALT 14; BUN 8; Creatinine, Ser 0.76; Hemoglobin 10.5; Platelets 258; Potassium 4.0; Sodium 136  Recent Lipid Panel    Component Value Date/Time   CHOL 214 (H) 02/26/2017 1425   TRIG 184 (  H) 02/26/2017 1425   HDL 45 02/26/2017 1425   CHOLHDL 4.8 (H) 02/26/2017 1425   LDLCALC 132 (H) 02/26/2017 1425    Physical Exam:    VS:  BP 102/62   Pulse 68   Ht 5\' 8"  (1.727 m)   Wt (!) 336 lb (152.4 kg)   SpO2 98%   BMI 51.09 kg/m     Wt Readings from Last 3 Encounters:  12/27/18 (!) 336 lb (152.4 kg)  09/09/18 (!) 325 lb (147.4 kg)  05/27/18 (!) 326 lb (147.9 kg)     GEN:  Well nourished, well developed in no acute distress HEENT: Normal NECK: No JVD; No carotid bruits LYMPHATICS: No lymphadenopathy CARDIAC: RRR, no murmurs, no rubs, no gallops RESPIRATORY:  Clear to auscultation without rales, wheezing or rhonchi  ABDOMEN: Soft, non-tender, non-distended MUSCULOSKELETAL:  No edema; No deformity  SKIN: Warm and dry LOWER EXTREMITIES: no swelling NEUROLOGIC:  Alert and oriented x 3 PSYCHIATRIC:  Normal affect   ASSESSMENT:    1. S/P tricuspid valve repair   2. History of drug abuse (HCC)   3. Sternal wound dehiscence, subsequent encounter    PLAN:    In order of problems listed above:  1. Status post VALVE repair history sounds beautifully doing well overall clinically. 2. History of drug abuse staying clean. 3. History of sternal wound dehiscence healed.   Medication Adjustments/Labs and Tests Ordered: Current medicines are reviewed at length with the patient today.  Concerns regarding medicines are  outlined above.  No orders of the defined types were placed in this encounter.  Medication changes: No orders of the defined types were placed in this encounter.   Signed, Georgeanna Leaobert J. Patrcia Schnepp, MD, [facility] WillistonFACC 12/27/2018 11:34 AM    [facility]

## 2019-01-16 ENCOUNTER — Other Ambulatory Visit: Payer: Self-pay | Admitting: Cardiology

## 2019-03-02 ENCOUNTER — Other Ambulatory Visit: Payer: Self-pay | Admitting: Cardiology

## 2019-04-14 ENCOUNTER — Other Ambulatory Visit: Payer: Self-pay

## 2019-04-14 ENCOUNTER — Telehealth (INDEPENDENT_AMBULATORY_CARE_PROVIDER_SITE_OTHER): Payer: Self-pay | Admitting: Thoracic Surgery (Cardiothoracic Vascular Surgery)

## 2019-04-14 DIAGNOSIS — Z9889 Other specified postprocedural states: Secondary | ICD-10-CM

## 2019-04-14 NOTE — Patient Instructions (Signed)
Continue all previous medications without any changes at this time  

## 2019-04-14 NOTE — Progress Notes (Signed)
[address]       [place], [place] [address]             [phone]     CARDIOTHORACIC SURGERY TELEPHONE VIRTUAL OFFICE NOTE  Referring Provider is [name], MD PCP is Patient, No Pcp Per   HPI:  I spoke with Ximena E Shavers (DOB 10-27-1985 ) via telephone on 04/14/2019 at 10:49 AM and verified that I was speaking with the correct person using more than one form of identification.  We discussed the reason(s) for conducting our visit virtually instead of in-person.  The patient expressed understanding the circumstances and agreed to proceed as described.   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  She was last seen here in our office on September 09, 2018 at which time she was doing well.  She underwent routine follow-up transthoracic echocardiogram on September 10, 2018 which revealed normal left and right ventricular systolic function with intact tricuspid valve repair and what was felt to be only trivial residual tricuspid regurgitation.  I spoke with the patient over the telephone today to see how she is doing.  Overall she is doing remarkably well and she states that she feels "much improved" in comparison with how she felt prior to tricuspid valve surgery.  She states that she still gets short of breath with exertion such as walking up a flight of stairs but she is only slightly limited by exertional shortness of breath.  She states that she walks every day and uses a treadmill.  She has been frustrated by the fact that she has not been able to lose much weight.  She has spoken with Dr. Bing Matter about possibly meeting with a dietitian to help with weight loss but she has not pursued this any further so far.  Her previous symptoms of orthopnea, lower  extremity edema, and severe exertional shortness of breath have essentially resolved.  Overall she has no complaints.   Current Outpatient Medications  Medication Sig Dispense Refill  . acetaminophen (TYLENOL) 325 MG tablet Take 2 tablets (650 mg total) by mouth every 4 (four) hours as needed for mild pain (or Fever >/= 101).    Marland Kitchen aspirin EC 325 MG EC tablet Take 1 tablet (325 mg total) by mouth daily. 30 tablet 0  . furosemide (LASIX) 40 MG tablet Take 1 tablet by mouth once daily 90 tablet 2  . ibuprofen (ADVIL,MOTRIN) 200 MG tablet Take 600 mg by mouth every 6 (six) hours as needed.    . methadone (DOLOPHINE) 10 MG/5ML solution Take 85 mg by mouth daily.     . Multiple Vitamin (MULTIVITAMIN WITH MINERALS) TABS tablet Take 1 tablet by mouth daily.    Marland Kitchen omeprazole (PRILOSEC) 20 MG capsule TAKE 1 CAPSULE (20 MG TOTAL) BY MOUTH 2 (TWO) TIMES DAILY BEFORE A MEAL. (Patient taking differently: Take 20 mg by mouth daily. ) 60 capsule 11  . potassium chloride (KLOR-CON) 10 MEQ tablet Take 1 tablet by mouth once daily 90 tablet 0   No current facility-administered medications for this visit.     Diagnostic Tests:   ECHOCARDIOGRAM REPORT       Patient Name:  Heather Day Date of Exam: 09/10/2018  Medical Rec #: [id number]   Height:    68.0  in  Accession #:  [id number]  Weight:    325.0 lb  Date of Birth: 03-Jun-1985   BSA:     2.51 m  Patient Age:  34 years   BP:      104/72 mmHg  Patient Gender: F       HR:      86 bpm.  Exam Location: [facility]     Procedure: 2D Echo   Indications:  H/O Tricupid valve repair    History:    Patient has prior history of Echocardiogram examinations,  most         recent 04/01/2018. Signs/Symptoms: Dyspnea. Tricuspid  Valve: the         tricuspid valve is status post repair with an annuloplasty  ring.         30 mm valve is present in the tricuspid position.  Tricuspid           Valve: the tricuspid valve is status post repair with an         annuloplasty ring. 30 mm valve is present in the tricuspid         position. H/O Tricupid valve repair.    Sonographer:  Sinda Du RDCS (AE)  Referring Phys: [phone] Marveen Reeks KRASOWSKI   IMPRESSIONS    1. The left ventricle has normal systolic function with an ejection  fraction of 60-65%. The cavity size was mild to moderately dilated. There  is mild concentric left ventricular hypertrophy. Left ventricular  diastolic parameters were normal.  2. The right ventricle has normal systolic function. The cavity was  normal. There is no increase in right ventricular wall thickness.  3. Mildly thickened tricuspid valve leaflets.  4. No evidence of mitral valve stenosis.  5. Mild post operative tricuspid stenosis mean gradient 4-5 mm Hg and a  valve area of 2.63 cm2.  6. Status post tricuspid valve repair. 30 mm valve is present in the  tricuspid position.  7. The aortic valve is tricuspid. No stenosis of the aortic valve.  8. The aortic root, ascending aorta and aortic arch are normal in size  and structure.  9. The inferior vena cava was normal in size with <50% respiratory  variability.  10. Right atrial size was severely dilated.  11. The tricuspid valve is abnormal.   FINDINGS  Left Ventricle: The left ventricle has normal systolic function, with an  ejection fraction of 60-65%. The cavity size was mild to moderately  dilated. There is mild concentric left ventricular hypertrophy. Left  ventricular diastolic parameters were  normal. Normal left ventricular filling pressures   Right Ventricle: The right ventricle has normal systolic function. The  cavity was normal. There is no increase in right ventricular wall  thickness.   Left Atrium: Left atrial size was normal in size.   Right Atrium: Right atrial size was severely dilated. Right atrial  pressure is  estimated at 8 mmHg.   Interatrial Septum: No atrial level shunt detected by color flow Doppler.   Pericardium: There is no evidence of pericardial effusion.   Mitral Valve: The mitral valve is normal in structure. Mitral valve  regurgitation is trivial by color flow Doppler. No evidence of mitral  valve stenosis.   Tricuspid Valve: The tricuspid valve is abnormal. Tricuspid valve  regurgitation is trivial by color flow Doppler. The tricuspid valve is  mildly thickened. Mild to moderate tricuspid stenosis is present. The  tricuspid valve is status post repair with an  annuloplasty ring.  30 mm valve is present in the tricuspid position.   Aortic Valve: The aortic valve is tricuspid Aortic valve regurgitation was  not visualized by color flow Doppler. There is No stenosis of the aortic  valve.   Pulmonic Valve: The pulmonic valve was grossly normal. Pulmonic valve  regurgitation was not assessed by color flow Doppler.   Aorta: The aortic root, ascending aorta and aortic arch are normal in size  and structure.   Venous: The inferior vena cava is normal in size with less than 50%  respiratory variability.     +--------------+--------++  LEFT VENTRICLE     +----------------+----------++  +--------------+--------++ Diastology          PLAX 2D         +----------------+----------++  +--------------+--------++ LV e' lateral: 14.70 cm/s  LVIDd:    5.74 cm  +----------------+----------++  +--------------+--------++ LV E/e' lateral:7.3      LVIDs:    3.29 cm  +----------------+----------++  +--------------+--------++ LV e' medial:  7.40 cm/s   LV PW:    1.05 cm  +----------------+----------++  +--------------+--------++ LV E/e' medial: 14.6     LV IVS:    1.11 cm  +----------------+----------++  +--------------+--------++  LVOT diam:  2.00 cm   +--------------+--------++  LV SV:    119  ml   +--------------+--------++  LV SV Index: 43.33    +--------------+--------++  LVOT Area:  3.14 cm  +--------------+--------++               +--------------+--------++   +---------------+---------++  RIGHT VENTRICLE       +---------------+---------++  RV S prime:  5.82 cm/s  +---------------+---------++  RVOT diam:   2.25 cm   +---------------+---------++  TAPSE (M-mode):1.6 cm    +---------------+---------++  RVSP:     24.3 mmHg  +---------------+---------++   +---------------+-------++-----------++  LEFT ATRIUM      Index     +---------------+-------++-----------++  LA diam:    3.50 cm1.39 cm/m   +---------------+-------++-----------++  LA Vol (A2C): 64.4 ml25.64 ml/m  +---------------+-------++-----------++  LA Vol (A4C): 41.9 ml16.68 ml/m  +---------------+-------++-----------++  LA Biplane Vol:54.0 ml21.50 ml/m  +---------------+-------++-----------++  +------------+---------+++  RIGHT ATRIUM       +------------+---------+++  RA Pressure:8.00 mmHg  +------------+---------+++  +------------+-----------++  AORTIC VALVE        +------------+-----------++  LVOT Vmax: 95.80 cm/s   +------------+-----------++  LVOT Vmean: 70.200 cm/s  +------------+-----------++  LVOT VTI:  0.208 m    +------------+-----------++    +-------------+-------++  AORTA          +-------------+-------++  Ao Root diam:3.00 cm  +-------------+-------++  Ao Asc diam: 3.10 cm  +-------------+-------++   +--------------+----------++ +---------------+-----------++  MITRAL VALVE       TRICUSPID VALVE        +--------------+----------++ +---------------+-----------++  MV Area (PHT):5.02 cm  TV Peak grad: 8.0 mmHg    +--------------+----------++ +---------------+-----------++  MV  PHT:    43.79 msec TV Mean grad: 4.5 mmHg    +--------------+----------++ +---------------+-----------++  MV Decel Time:151 msec  TV Vmax:    1.41 m/s    +--------------+----------++ +---------------+-----------++  +--------------+-----------++ TV Vmean:   99.8 cm/s   MV E velocity:108.00 cm/s +---------------+-----------++  +--------------+-----------++ TV VTI:    0.39 msec   MV A velocity:77.80 cm/s  +---------------+-----------++  +--------------+-----------++ TR Peak grad: 16.3 mmHg   MV E/A ratio: 1.39     +---------------+-----------++  +--------------+-----------++ TR Mean grad: 5.0 mmHg                  +---------------+-----------++  TR Vmax:    299.00 cm/s                +---------------+-----------++                TR Vmean:   105.0 cm/s                 +---------------+-----------++                Estimated RAP: 8.00 mmHg                 +---------------+-----------++                RVSP:     24.3 mmHg                 +---------------+-----------++                  +--------------+-------+                SHUNTS                        +--------------+-------+                Systemic VTI: 0.21 m                 +--------------+-------+                Systemic Diam:2.00 cm                +--------------+-------+                Pulmonic Diam:2.25 cm                +--------------+-------+     Shirlee More MD  Electronically signed by Shirlee More MD  Signature Date/Time: 09/10/2018/4:45:45 PM     Impression:  Patient is doing well approximately 1 year  status post tricuspid valve repair  Plan:  The patient will continue to follow-up periodically with Dr. Agustin Cree.  We have not recommended any changes in her current medications.  The patient will return to our office in the future only should specific problems or questions arise.    I discussed limitations of evaluation and management via telephone.  The patient was advised to call back for repeat telephone consultation or to seek an in-person evaluation if questions arise or the patient's clinical condition changes in any significant manner.  I spent in excess of 5 minutes of non-face-to-face time during the conduct of this telephone virtual office consultation.     Valentina Gu. Roxy Manns, MD 04/14/2019 10:49 AM

## 2019-05-04 ENCOUNTER — Other Ambulatory Visit: Payer: Self-pay | Admitting: Cardiology

## 2019-07-29 IMAGING — CT CT CHEST W/ CM
4 of 5 series · 16 of 36 positions shown, 18 images · IV contrast (iopamidol)
Comparison: Cardiac CT dated April 01, 2018.

CLINICAL DATA: Sternal wound infection. Evaluate for mediastinitis.
Prior tricuspid valve repair on 04/18/2018.

EXAM:
CT CHEST WITH CONTRAST
TECHNIQUE: Multidetector CT imaging of the chest was performed during
intravenous contrast administration.
CONTRAST:  75mL X5DAIS-WQQ IOPAMIDOL (X5DAIS-WQQ) INJECTION 61%

[Series 2: chest 2.00 br40 s3 ax · axial · 0.87mm/px · z∈[+1677,+1893]mm · 5 of 164 slices shown, 7 images]
[im 28/164  mediastinal]
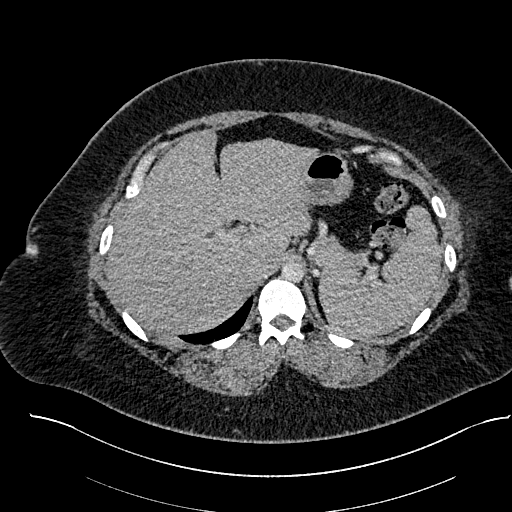
[im 28/164  lung]
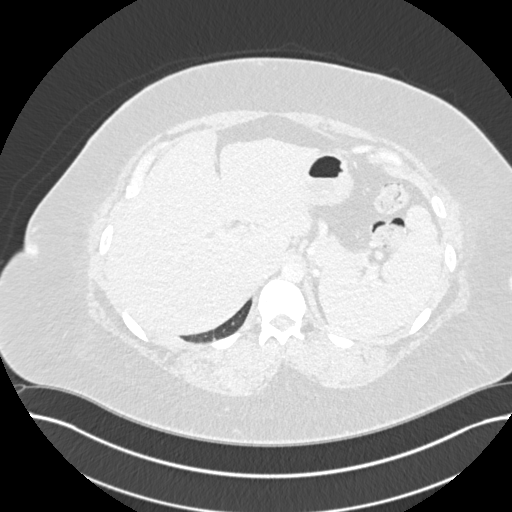
[im 55/164  lung]
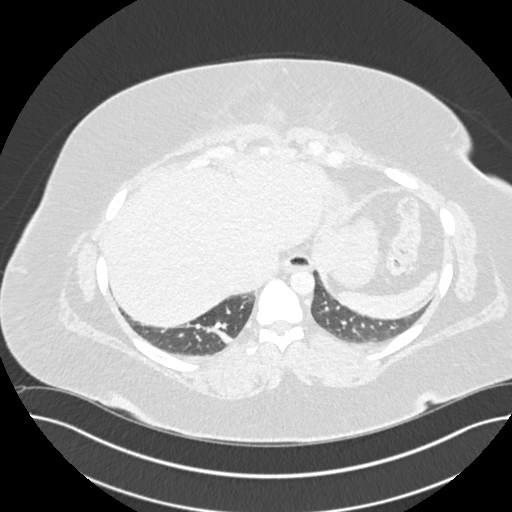
[im 82/164  lung]
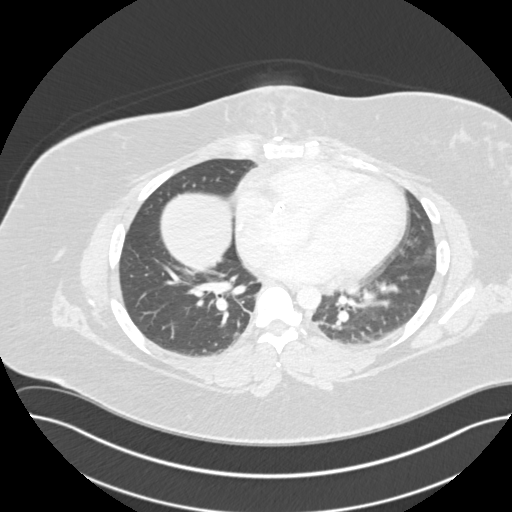
[im 109/164  lung]
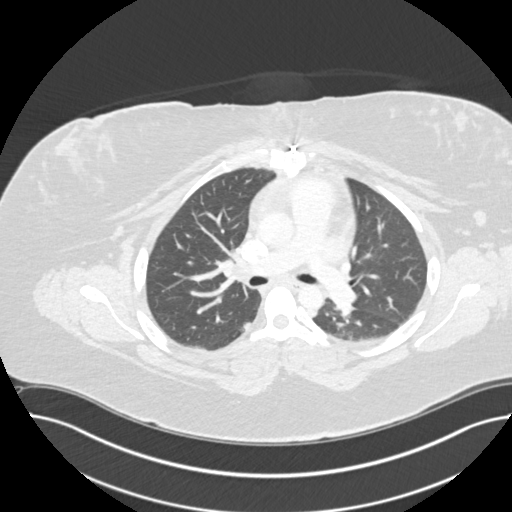
[im 136/164  mediastinal]
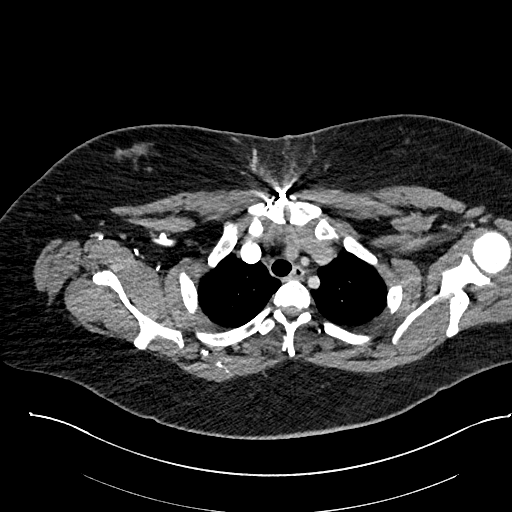
[im 136/164  lung]
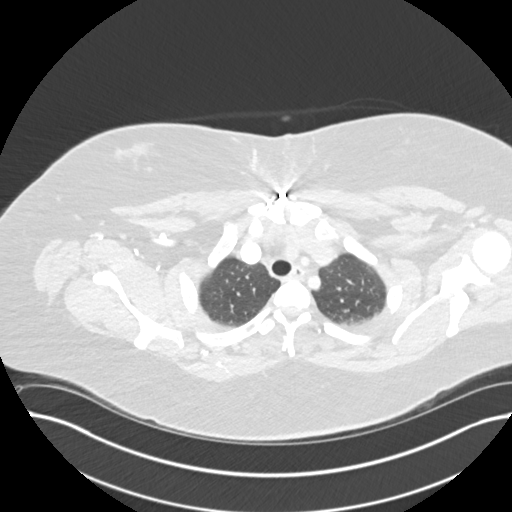

[Series 4: chest 2.00 br40 s3 cor · coronal · 0.64mm/px · 3 of 221 slices shown]
[im 45/221  lung]
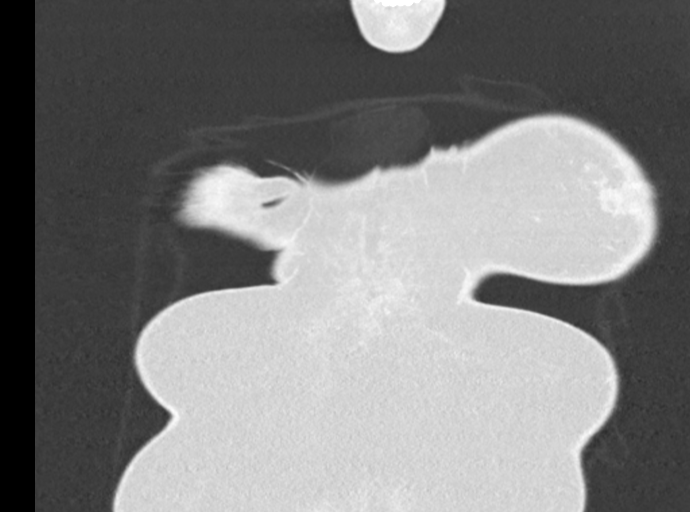
[im 89/221  lung]
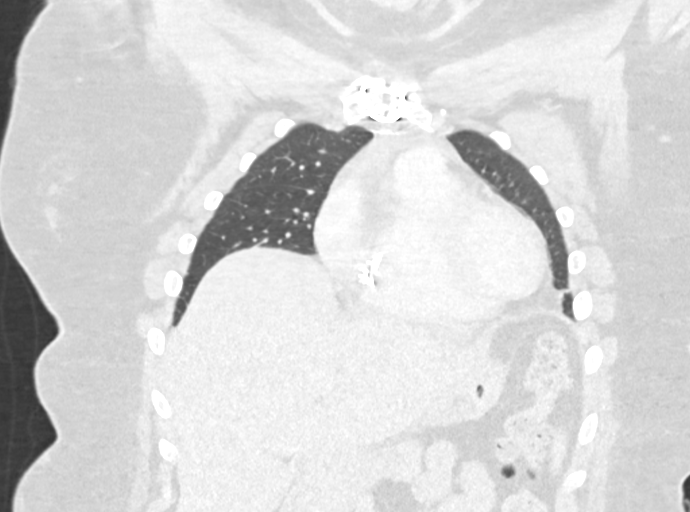
[im 133/221  lung]
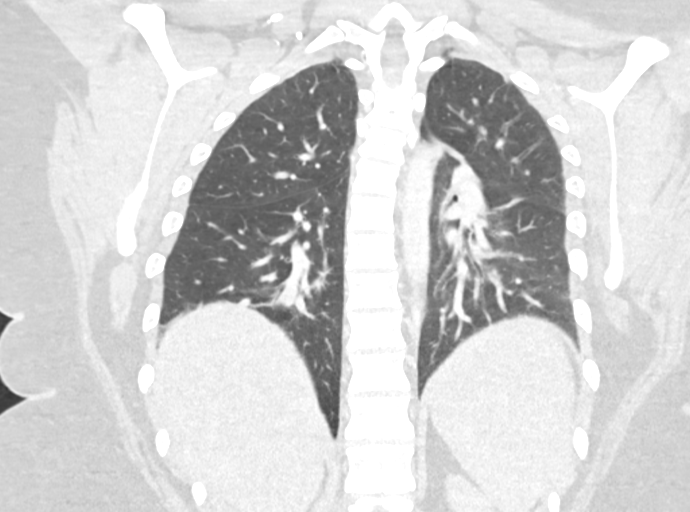

[Series 8: chest 2.00 br60 s3 ax · axial · 0.87mm/px · z∈[+1677,+1893]mm · 5 of 164 slices shown]
[im 28/164  lung]
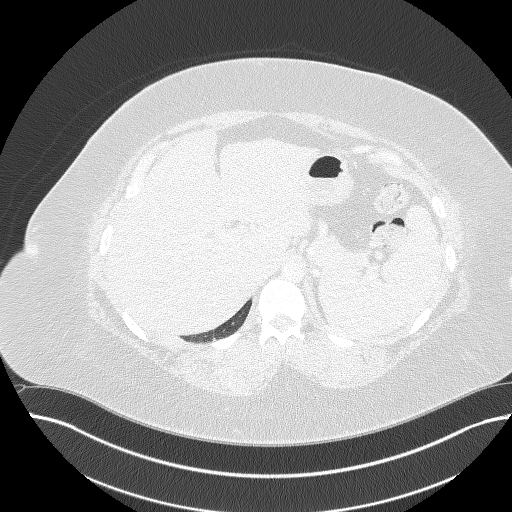
[im 55/164  lung]
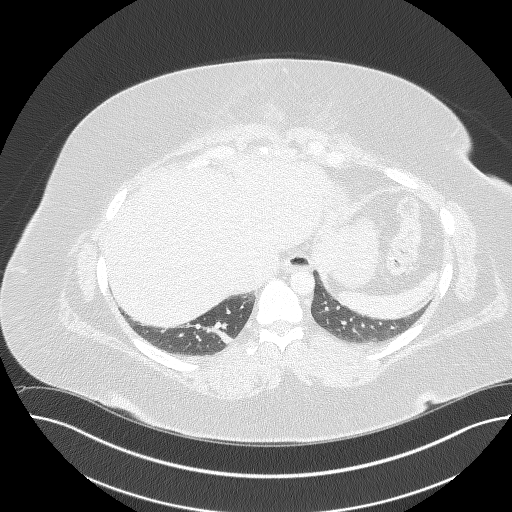
[im 82/164  lung]
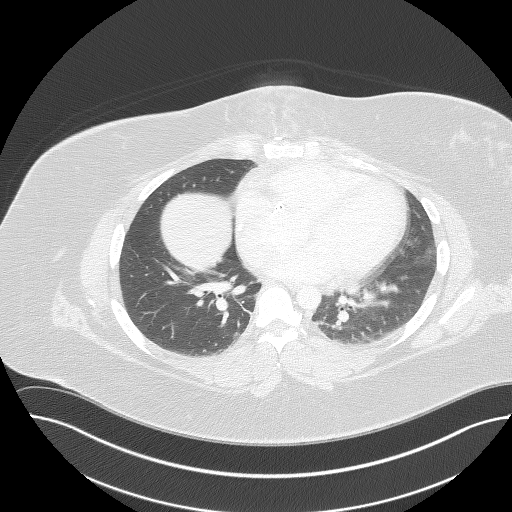
[im 109/164  lung]
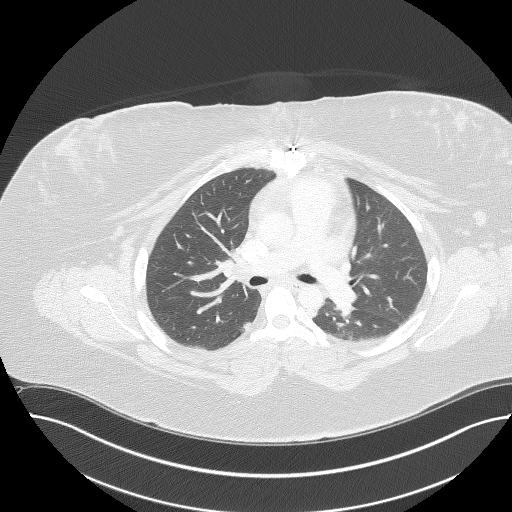
[im 136/164  lung]
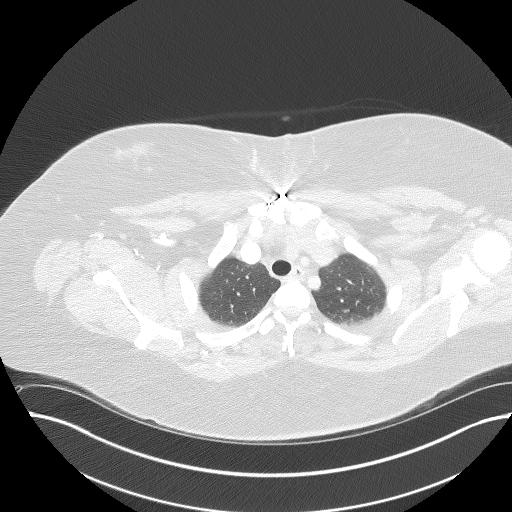

[Series 11: chest 1.00 br60 s3 · axial · 0.56mm/px · z∈[+1721,+1778]mm · 3 of 309 slices shown]
[im 24/309  lung]
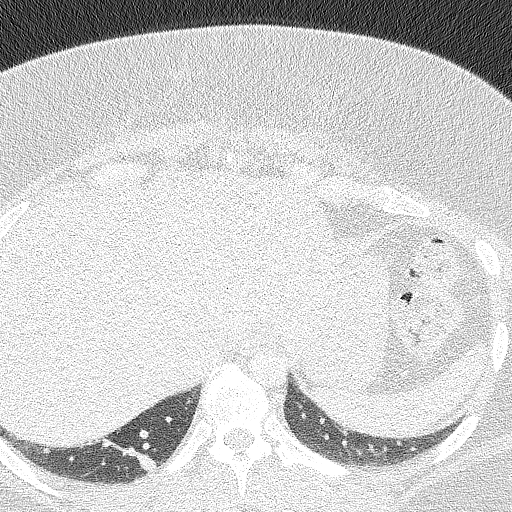
[im 72/309  lung]
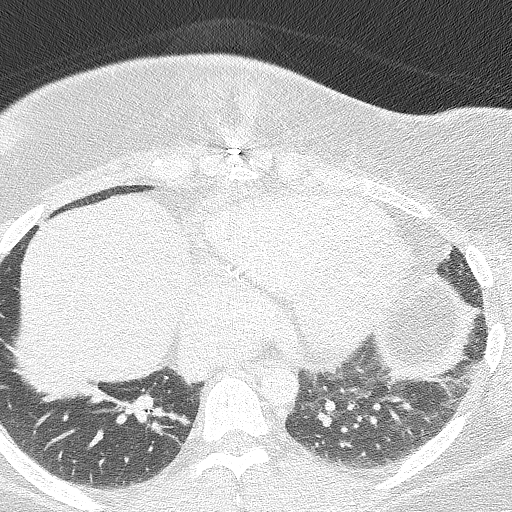
[im 95/309  lung]
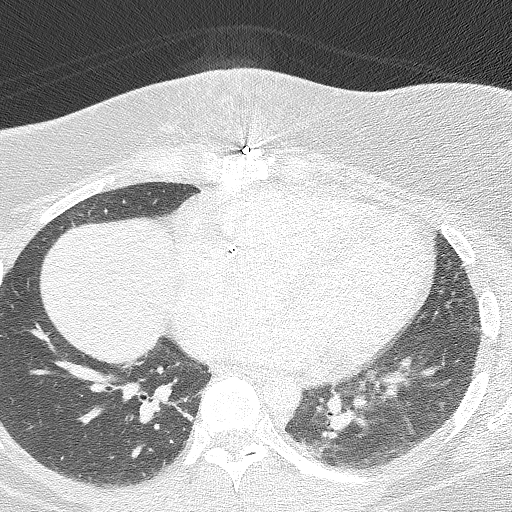

[16 of 36 positions shown; findings below may reference images not displayed]

FINDINGS: Cardiovascular: Mild cardiomegaly. Prior tricuspid valve
replacement. No pericardial effusion. No thoracic aortic aneurysm or
dissection. No central pulmonary embolism.

Mediastinum/Nodes: Nonspecific stranding within the anterior
mediastinum. No enlarged mediastinal, hilar, or axillary lymph
nodes. Thyroid gland, trachea, and esophagus demonstrate no
significant findings.

Lungs/Pleura: Subsegmental atelectasis/scarring in both lower lobes.
No focal consolidation, pleural effusion, or pneumothorax. No
suspicious pulmonary nodule.

Upper Abdomen: No acute abnormality.  Prior cholecystectomy.

Musculoskeletal: Prior median sternotomy. No osseous destruction or
periosteal reaction. Small area of superficial dehiscence along the
inferior sternal incision. Mild midline presternal soft tissue
stranding. No fluid collection.
IMPRESSION: 1. Mild nonspecific stranding within the anterior mediastinum,
potentially postsurgical. No convincing evidence of mediastinitis.
No fluid collection or mediastinal air.
2. Intact median sternotomy without osseous destruction or
periosteal reaction to suggest osteomyelitis.
3. Small area of superficial dehiscence along the inferior sternal
incision with mild midline presternal soft tissue stranding. No soft
tissue abscess.

## 2019-08-12 ENCOUNTER — Other Ambulatory Visit: Payer: Self-pay | Admitting: Cardiology

## 2019-08-12 IMAGING — CR CHEST - 2 VIEW
2 series · 2 of 2 positions shown · non-contrast
Comparison: 05/13/2018

CLINICAL DATA: Hx heart surg [DATE], sternal wound debridement
05/16/2018sternal wound dehiscence

EXAM:
CHEST - 2 VIEW

[w chest pa]
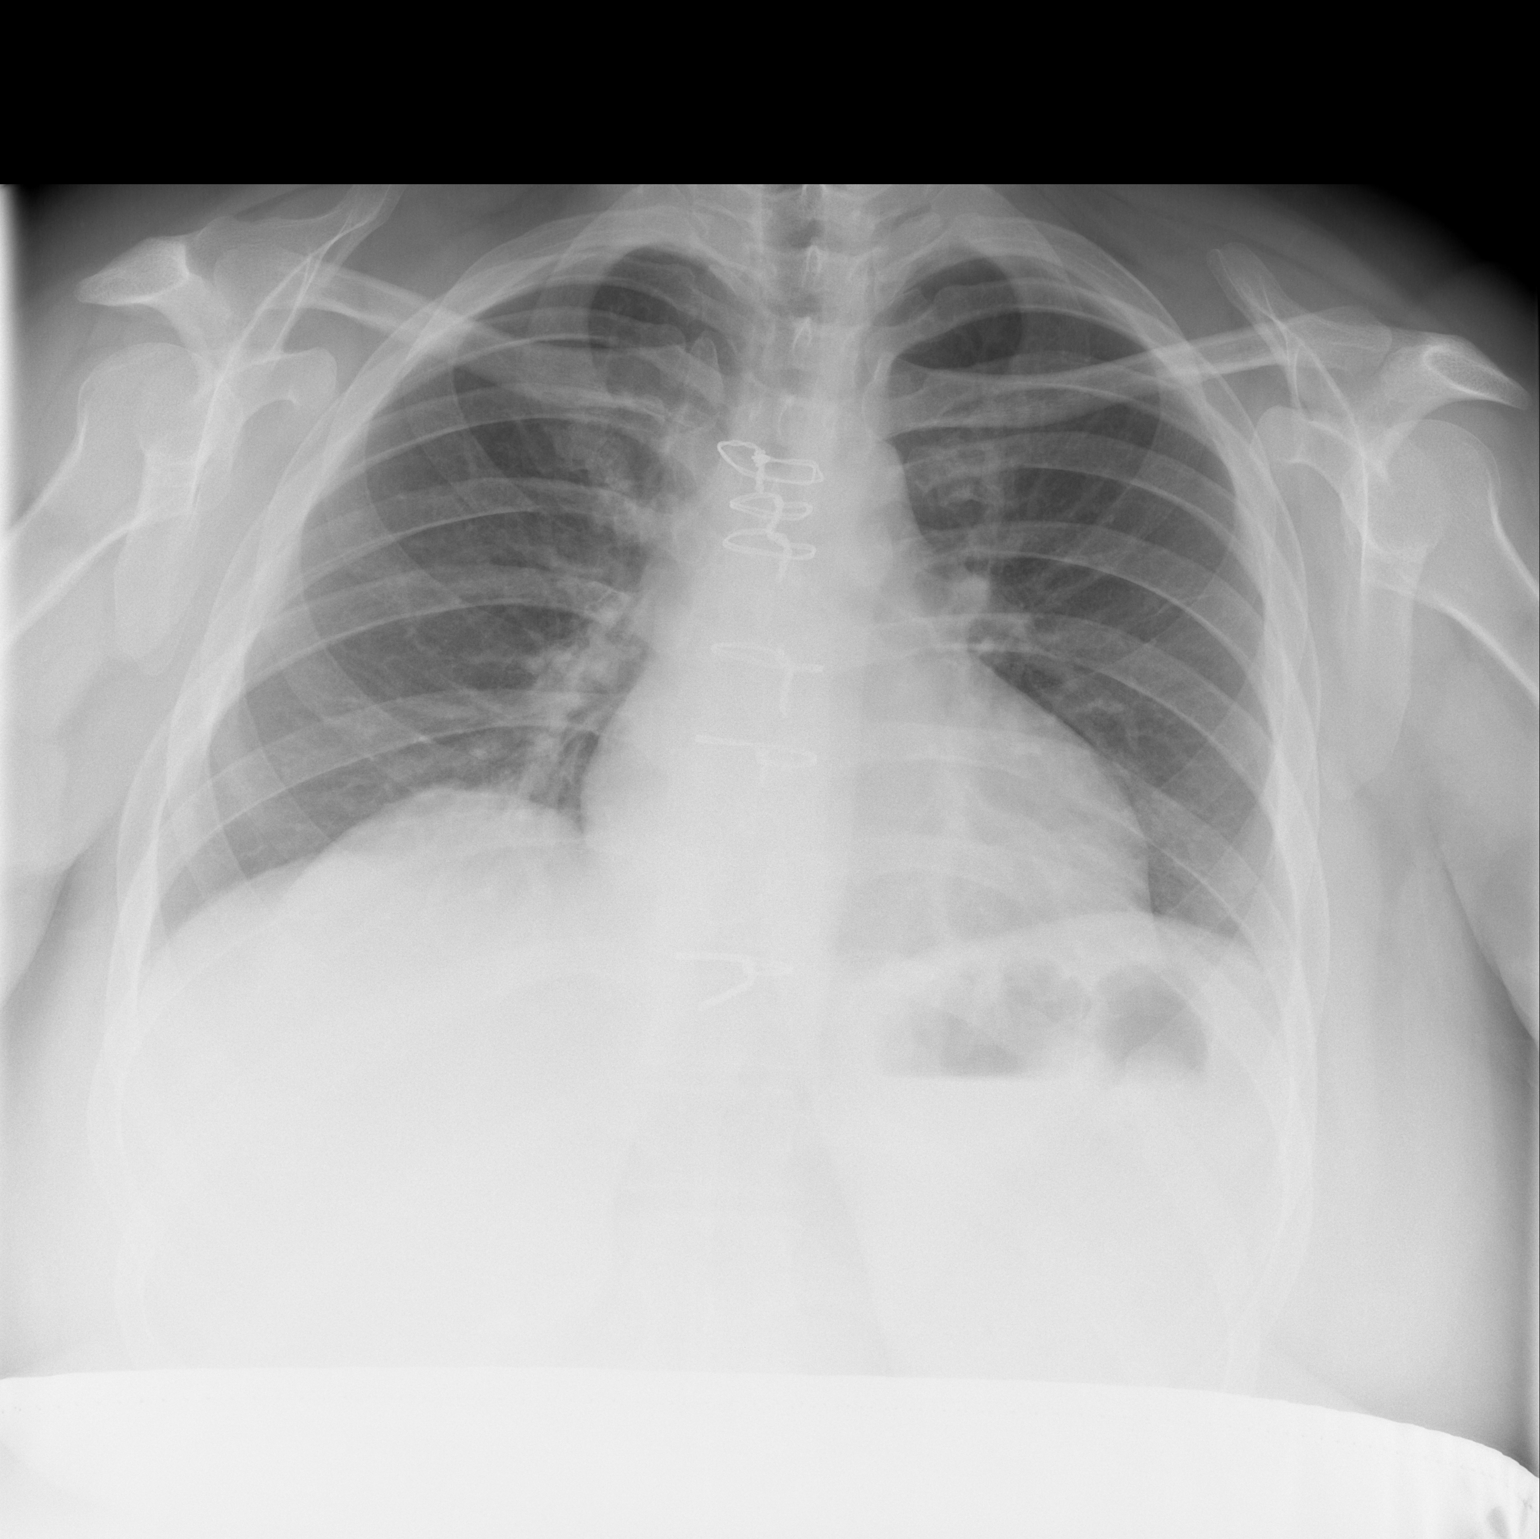

[w chest lat]
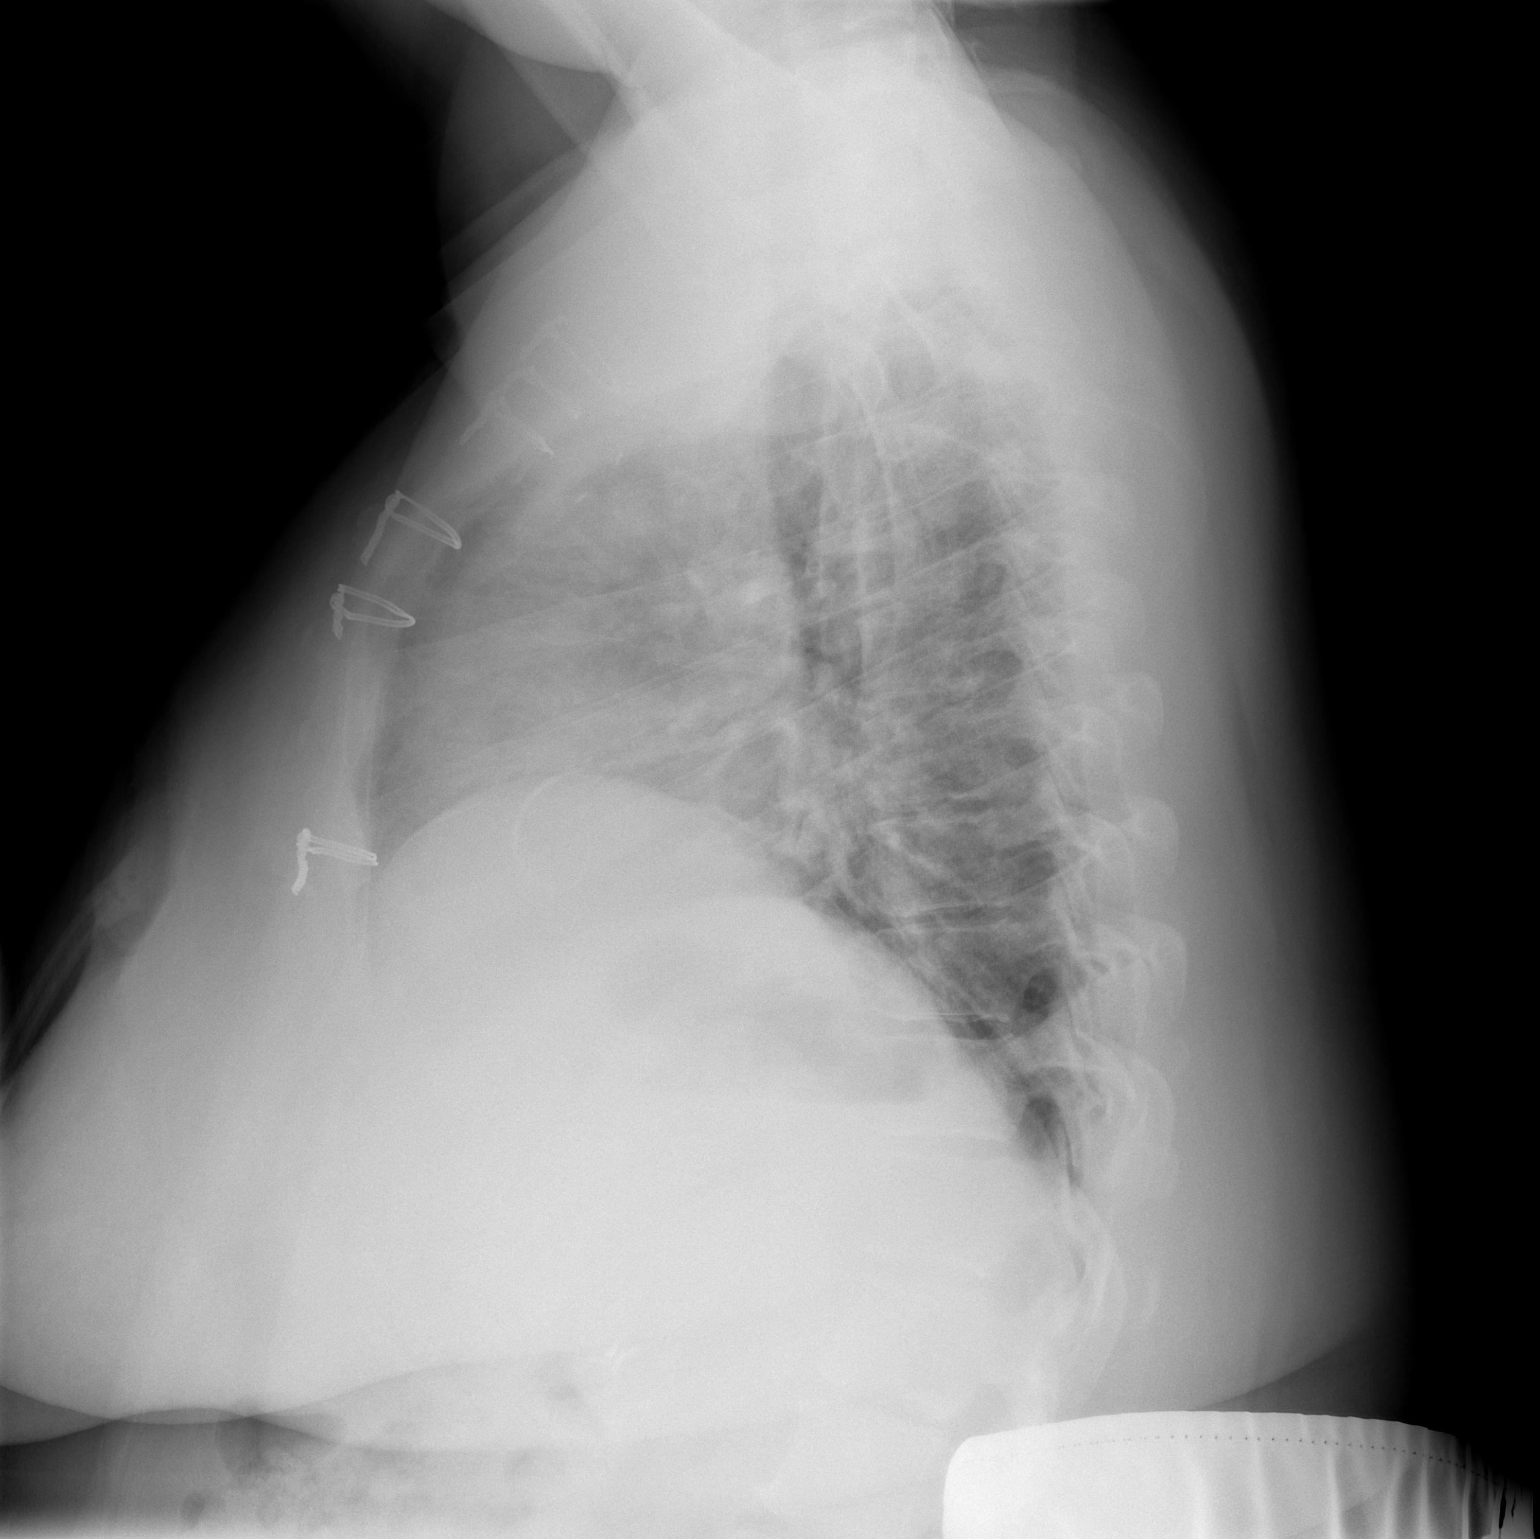

[2 of 2 positions shown; findings below may reference images not displayed]

FINDINGS: Sternotomy wires overlie normal cardiac silhouette. No effusion,
infiltrate or pneumothorax. The second and third most inferior
sternotomy wire has been removed.
IMPRESSION: No active cardiopulmonary disease.

## 2019-11-24 ENCOUNTER — Other Ambulatory Visit: Payer: Self-pay | Admitting: Cardiology

## 2019-12-27 ENCOUNTER — Other Ambulatory Visit: Payer: Self-pay | Admitting: Cardiology

## 2019-12-30 NOTE — Telephone Encounter (Signed)
Refill sent to pharmacy.   

## 2020-01-21 ENCOUNTER — Other Ambulatory Visit: Payer: Self-pay | Admitting: Cardiology

## 2020-01-23 IMAGING — US US ABDOMEN LIMITED
1 series · 13 of 25 positions shown · non-contrast
Comparison: CT abdomen and pelvis July 04, 2017

CLINICAL DATA: Right upper quadrant tenderness

EXAM:
ULTRASOUND ABDOMEN LIMITED RIGHT UPPER QUADRANT

[Series 1: us abdomen limited · 0.27mm/px · 13 of 75 slices shown]
[im 1/75]
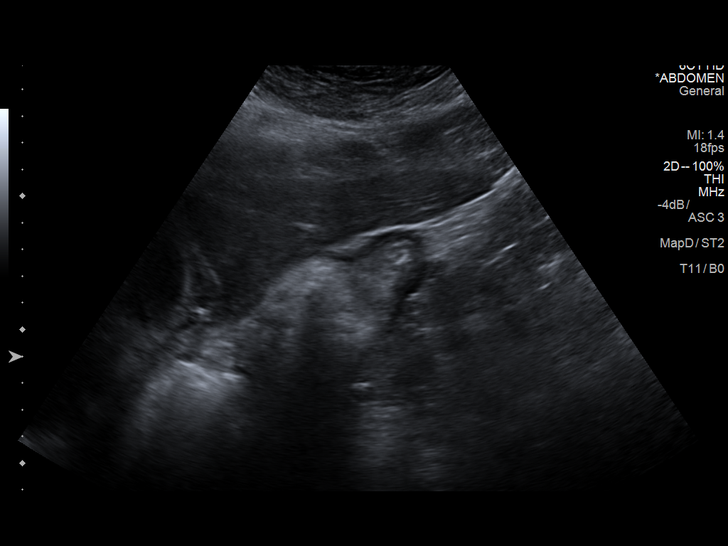
[im 7/75]
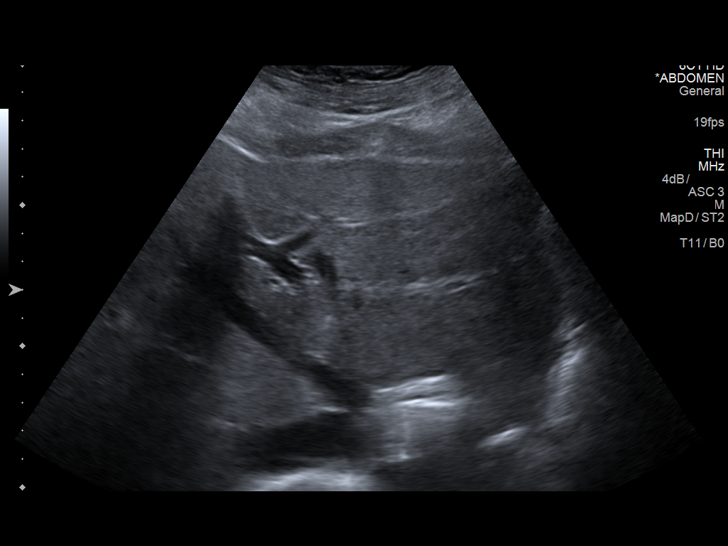
[im 13/75]
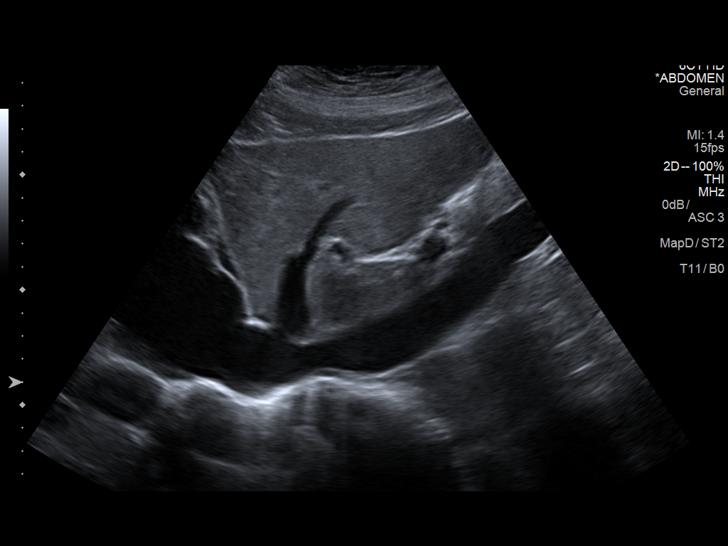
[im 19/75]
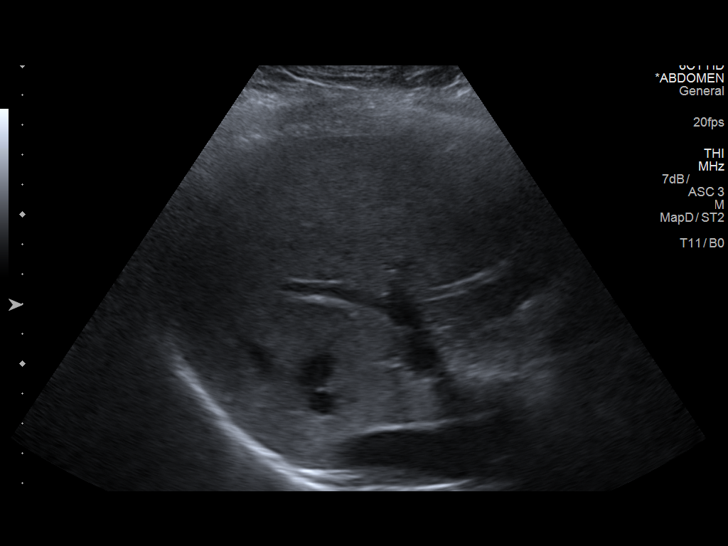
[im 25/75]
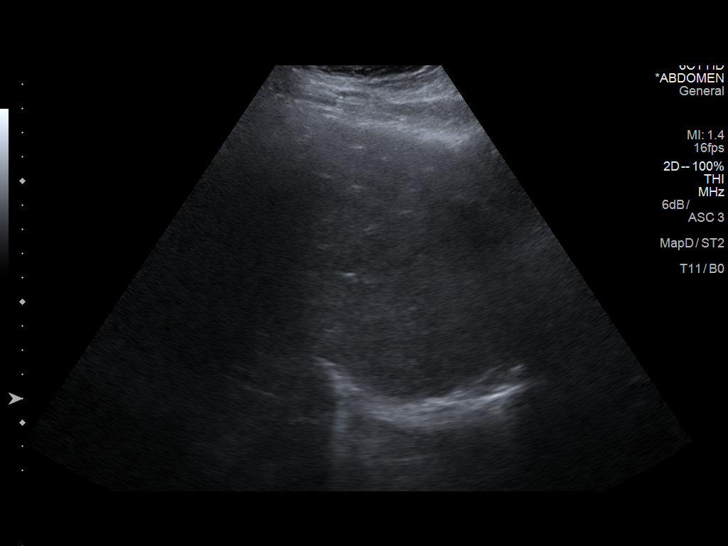
[im 31/75]
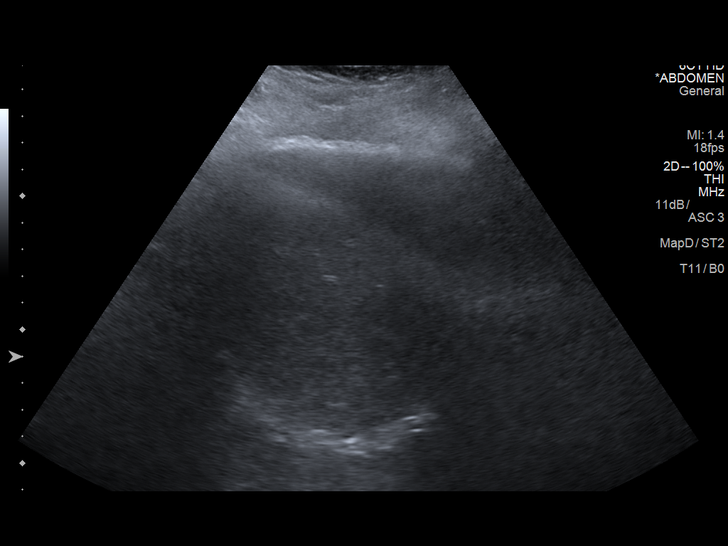
[im 38/75]
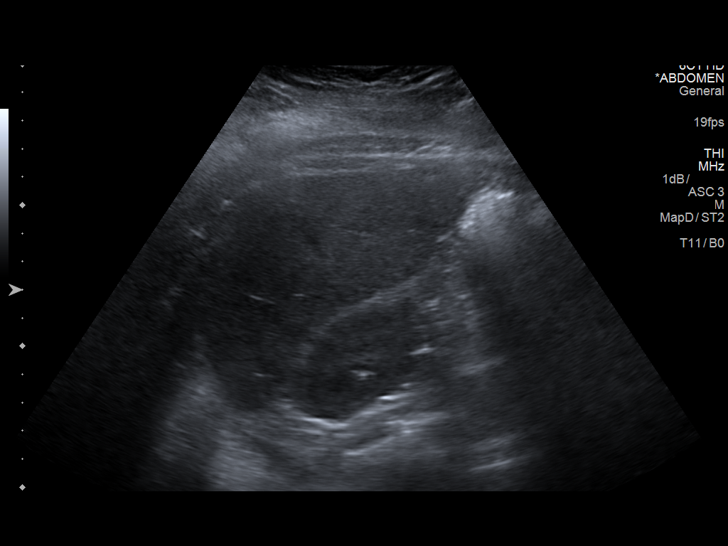
[im 44/75]
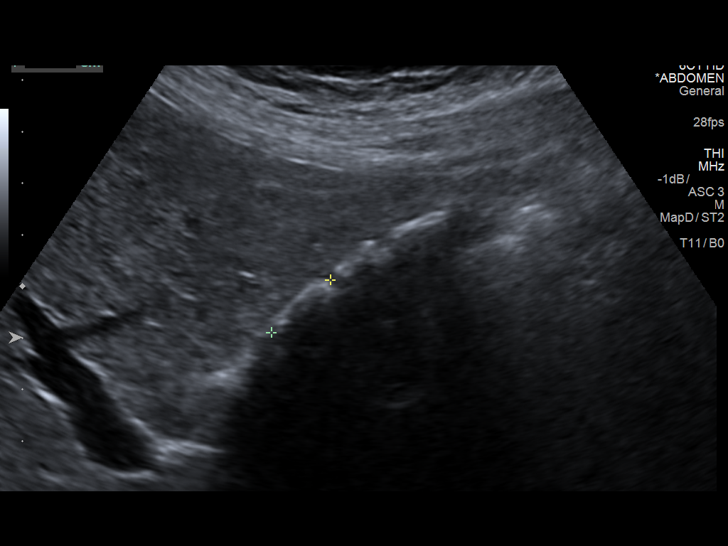
[im 50/75]
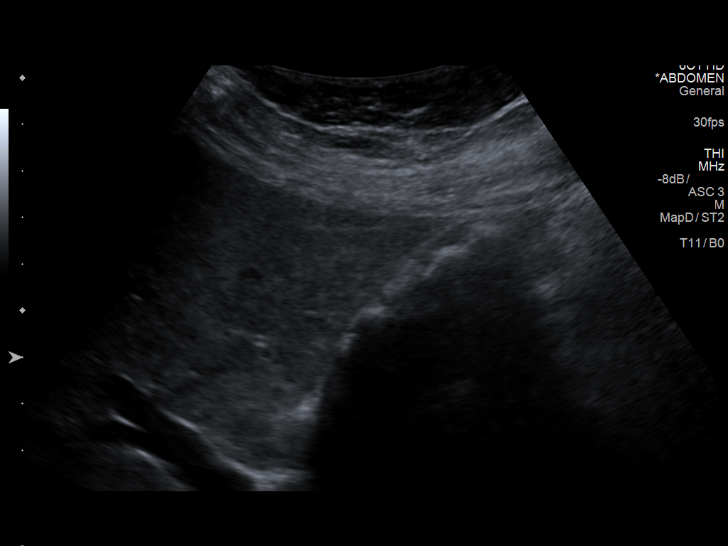
[im 56/75]
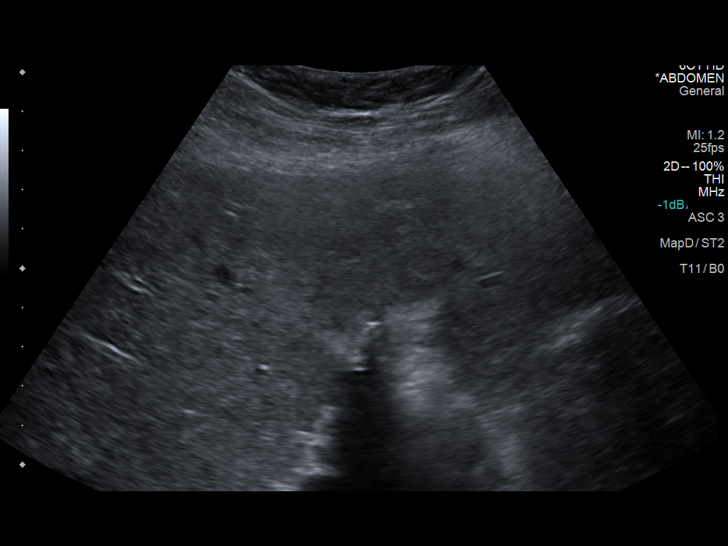
[im 62/75]
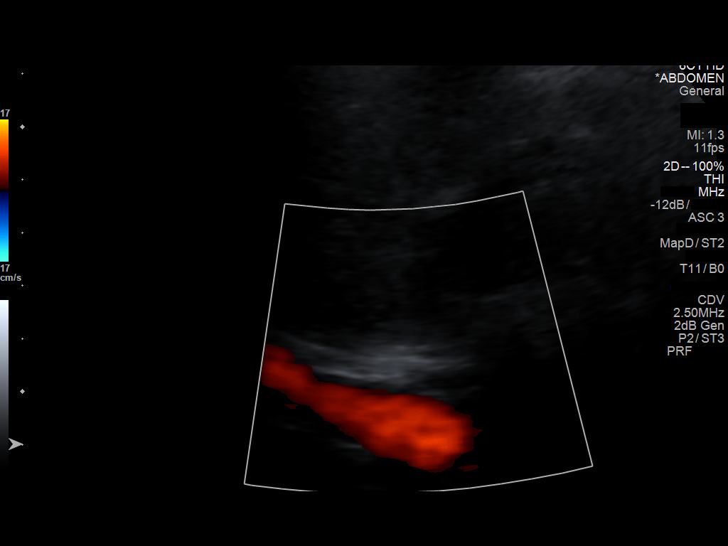
[im 68/75]
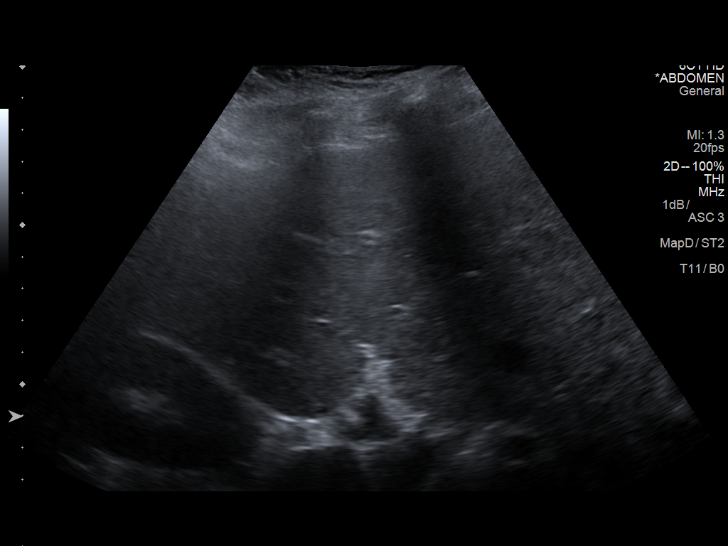
[im 75/75]
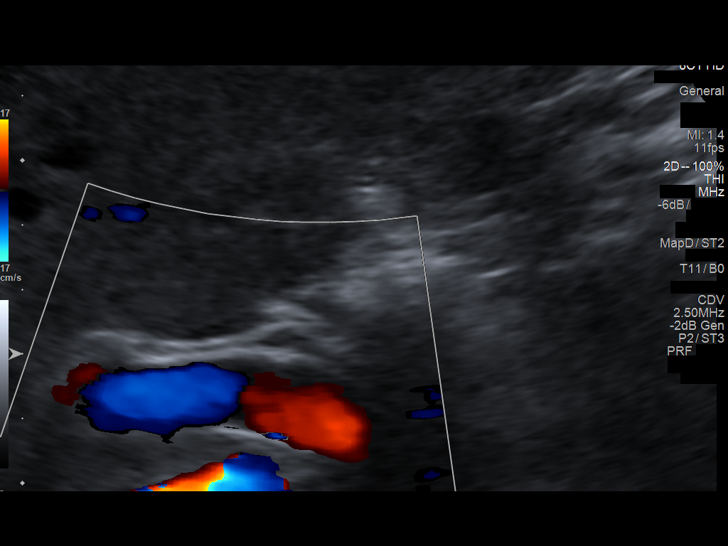

[13 of 25 positions shown; findings below may reference images not displayed]

FINDINGS: Gallbladder:

Gallbladder is contracted and filled with calculi. Largest
individual calculus measures 1.5 cm in length. Gallbladder wall does
appear somewhat thickened. There is no appreciable gallbladder wall
edema or pericholecystic fluid no sonographic Murphy sign noted by
sonographer.

Common bile duct:

Diameter: 4 mm. No intrahepatic or extrahepatic biliary duct
dilatation.

Liver:

There is an echogenic focus in the left lobe of the liver measuring
0.7 x 0.8 x 0.8 cm. Within normal limits in parenchymal
echogenicity. Portal vein is patent on color Doppler imaging with
normal direction of blood flow towards the liver.
IMPRESSION: 1. Cholelithiasis with contracted gallbladder. Gallbladder wall
appears somewhat thickened. A degree of cholecystitis is of concern
given this appearance. This finding may warrant nuclear medicine
hepatobiliary imaging study to assess for cystic duct patency.

2. There is a 0.7 x 0.8 x 0.8 cm echogenic focus in the left lobe of
the liver, a probable small hemangioma. This presumed small
hemangioma was not appreciable on recent CT examination. Given this
circumstance, a follow-up ultrasound of the liver in 1 year to
confirm stability would be advisable.

## 2020-01-28 ENCOUNTER — Other Ambulatory Visit: Payer: Self-pay

## 2020-01-28 ENCOUNTER — Telehealth: Payer: Self-pay | Admitting: Cardiology

## 2020-01-28 ENCOUNTER — Ambulatory Visit (INDEPENDENT_AMBULATORY_CARE_PROVIDER_SITE_OTHER): Payer: Self-pay | Admitting: Cardiology

## 2020-01-28 VITALS — BP 128/70 | HR 70 | Ht 68.0 in | Wt 376.0 lb

## 2020-01-28 DIAGNOSIS — F1911 Other psychoactive substance abuse, in remission: Secondary | ICD-10-CM

## 2020-01-28 DIAGNOSIS — Z6841 Body Mass Index (BMI) 40.0 and over, adult: Secondary | ICD-10-CM | POA: Insufficient documentation

## 2020-01-28 DIAGNOSIS — Z9889 Other specified postprocedural states: Secondary | ICD-10-CM

## 2020-01-28 NOTE — Telephone Encounter (Signed)
Iona is returning Hayley's call.

## 2020-01-28 NOTE — Telephone Encounter (Signed)
Bryan is calling wanting to know if she can be scheduled to come in for a nurse visit to have an EKG. Please advise.

## 2020-01-28 NOTE — Patient Instructions (Signed)
Medication Instructions:  Your physician recommends that you continue on your current medications as directed. Please refer to the Current Medication list given to you today.  *If you need a refill on your cardiac medications before your next appointment, please call your pharmacy*   Lab Work: Your physician recommends that you return for lab work today: cmp, tsh, cbc, lipid  If you have labs (blood work) drawn today and your tests are completely normal, you will receive your results only by: Marland Kitchen MyChart Message (if you have MyChart) OR . A paper copy in the mail If you have any lab test that is abnormal or we need to change your treatment, we will call you to review the results.   Testing/Procedures: Your physician has requested that you have an echocardiogram. Echocardiography is a painless test that uses sound waves to create images of your heart. It provides your doctor with information about the size and shape of your heart and how well your heart's chambers and valves are working. This procedure takes approximately one hour. There are no restrictions for this procedure.     Follow-Up: At [facility], you and your health needs are our priority.  As part of our continuing mission to provide you with exceptional heart care, we have created designated Provider Care Teams.  These Care Teams include your primary Cardiologist (physician) and Advanced Practice Providers (APPs -  Physician Assistants and Nurse Practitioners) who all work together to provide you with the care you need, when you need it.  We recommend signing up for the patient portal called "MyChart".  Sign up information is provided on this After Visit Summary.  MyChart is used to connect with patients for Virtual Visits (Telemedicine).  Patients are able to view lab/test results, encounter notes, upcoming appointments, etc.  Non-urgent messages can be sent to your provider as well.   To learn more about what you can do with  MyChart, go to [email or web address].    Your next appointment:   6 month(s)  The format for your next appointment:   In Person  Provider:   [name], MD   Other Instructions   Echocardiogram An echocardiogram is a procedure that uses painless sound waves (ultrasound) to produce an image of the heart. Images from an echocardiogram can provide important information about:  Signs of coronary artery disease (CAD).  Aneurysm detection. An aneurysm is a weak or damaged part of an artery wall that bulges out from the normal force of blood pumping through the body.  Heart size and shape. Changes in the size or shape of the heart can be associated with certain conditions, including heart failure, aneurysm, and CAD.  Heart muscle function.  Heart valve function.  Signs of a past heart attack.  Fluid buildup around the heart.  Thickening of the heart muscle.  A tumor or infectious growth around the heart valves. Tell a health care provider about:  Any allergies you have.  All medicines you are taking, including vitamins, herbs, eye drops, creams, and over-the-counter medicines.  Any blood disorders you have.  Any surgeries you have had.  Any medical conditions you have.  Whether you are pregnant or may be pregnant. What are the risks? Generally, this is a safe procedure. However, problems may occur, including:  Allergic reaction to dye (contrast) that may be used during the procedure. What happens before the procedure? No specific preparation is needed. You may eat and drink normally. What happens during the procedure?   An  IV tube may be inserted into one of your veins.  You may receive contrast through this tube. A contrast is an injection that improves the quality of the pictures from your heart.  A gel will be applied to your chest.  A wand-like tool (transducer) will be moved over your chest. The gel will help to transmit the sound waves from the  transducer.  The sound waves will harmlessly bounce off of your heart to allow the heart images to be captured in real-time motion. The images will be recorded on a computer. The procedure may vary among health care providers and hospitals. What happens after the procedure?  You may return to your normal, everyday life, including diet, activities, and medicines, unless your health care provider tells you not to do that. Summary  An echocardiogram is a procedure that uses painless sound waves (ultrasound) to produce an image of the heart.  Images from an echocardiogram can provide important information about the size and shape of your heart, heart muscle function, heart valve function, and fluid buildup around your heart.  You do not need to do anything to prepare before this procedure. You may eat and drink normally.  After the echocardiogram is completed, you may return to your normal, everyday life, unless your health care provider tells you not to do that. This information is not intended to replace advice given to you by your health care provider. Make sure you discuss any questions you have with your health care provider. Document Revised: 06/20/2018 Document Reviewed: 04/01/2016 Elsevier Patient Education  2020 [employer]   

## 2020-01-28 NOTE — Progress Notes (Signed)
Cardiology Office Note:    Date:  01/28/2020   ID:  Heather Day, DOB 03-23-1985, MRN [id number]  PCP:  Patient, No Pcp Per  Cardiologist:  [name], MD    Referring MD: No ref. provider found   Chief Complaint  Patient presents with  . Follow-up  I am doing well cardiac wise  History of Present Illness:    Heather Day is a 34 y.o. female with past medical history significant for tricuspid valve repair done about 2 years ago, history of drug abuse, she has been clean she is to move into [place] from [place].  Cardiac wise seems to be doing well she does have some fatigue tiredness and shortness of breath with exertion also described to have some swelling of lower extremities but she gained significant amount of weight and she is very frustrated about this.  On top of that she is telling me that she is getting some depression.  Apparently the family that still live in [place] does not want to have anything to do with her, they have already contacted, they are simply ashame of her.  She realized that.  And that it really makes her depressed.  Likely she does not have suicidal liberalization.  She works from home, she used to walk on the regular basis but lately gave up on that.  Past Medical History:  Diagnosis Date  . Anxiety    panic attacks  . Bipolar disorder (HCC)   . Drug abuse, IV (HCC)   . Dyspnea    when walking up stairs  . Gallstone   . GERD (gastroesophageal reflux disease)   . Heartburn   . Hepatitis C   . Periodontitis chronic, apical    dental caries  . Pre-diabetes   . PTSD (post-traumatic stress disorder)   . 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 Severe tricuspid regurgitation   . Wears glasses   . Wound infection after surgery    Sternal wound    Past Surgical History:    Procedure Laterality Date  . APPENDECTOMY    . APPLICATION OF WOUND VAC N/A 05/16/2018   Procedure: APPLICATION OF WOUND VAC;  Surgeon: Purcell Nails, MD;  Location: [facility] OR;  Service: Thoracic;  Laterality: N/A;  . CESAREAN SECTION    . CHOLECYSTECTOMY N/A 01/15/2018   Procedure: LAPAROSCOPIC CHOLECYSTECTOMY ERAS PATHWAY;  Surgeon: Glenna Fellows, MD;  [facility] OR;  Service: General;  Laterality: N/A;  . INTRAOPERATIVE CHOLANGIOGRAM N/A 01/15/2018   Procedure: INTRAOPERATIVE CHOLANGIOGRAM;  Surgeon: Glenna Fellows, MD;  Location: [facility] OR;  Service: General;  Laterality: N/A;  . LAPAROSCOPIC APPENDECTOMY N/A 07/04/2017   Procedure: APPENDECTOMY LAPAROSCOPIC;  Surgeon: Luretha Murphy, MD;  Location: [facility] ORS;  Service: General;  Laterality: N/A;  . MULTIPLE EXTRACTIONS WITH ALVEOLOPLASTY Bilateral 11/29/2017   Procedure: Extraction of tooth #'s 2,5-15, and 31 with alveoloplasty and gross debridement of remaining dentition;  Surgeon: Charlynne Pander, DDS;  Location: [facility] OR;  Service: Oral Surgery;  Laterality: Bilateral;  . STERNAL WOUND DEBRIDEMENT N/A 05/16/2018   Procedure: STERNAL WOUND DEBRIDEMENT with Sternal wire removal x2.;  Surgeon: Purcell Nails, MD;  Location: [facility] OR;  Service: Thoracic;  Laterality: N/A;  . TEE WITHOUT CARDIOVERSION N/A 10/03/2017   Procedure: TRANSESOPHAGEAL ECHOCARDIOGRAM (TEE);  Surgeon: Jodelle Red, MD;  Location: [facility] ENDOSCOPY;  Service: Cardiovascular;  Laterality: N/A;  . TEE WITHOUT CARDIOVERSION N/A 04/18/2018   Procedure: TRANSESOPHAGEAL ECHOCARDIOGRAM (TEE);  Surgeon: Purcell Nails, MD;  Location: [facility] OR;  Service: Open Heart Surgery;  Laterality: N/A;  . TRICUSPID VALVE REPLACEMENT N/A 04/18/2018   Procedure: TRICUSPID VALVE REPAIR;  Surgeon: Purcell Nails, MD;  Location: [facility] OR;  Service: Open Heart Surgery;  Laterality: N/A;    Current Medications: Current Meds  Medication Sig  . furosemide (LASIX) 40 MG tablet Take 1 tablet (40 mg  total) by mouth daily. Patient needs appointment for further refills.  . potassium chloride (KLOR-CON) 20 MEQ packet Take by mouth daily.     Allergies:   Patient has no known allergies.   Social History   Socioeconomic History  . Marital status: Single    Spouse name: Not on file  . Number of children: Not on file  . Years of education: Not on file  . Highest education level: Not on file  Occupational History  . Not on file  Tobacco Use  . Smoking status: Former Smoker    Packs/day: 1.00    Years: 20.00    Pack years: 20.00    Types: Cigarettes    Quit date: 04/26/2017    Years since quitting: 2.7  . Smokeless tobacco: Never Used  Vaping Use  . Vaping Use: Never used  Substance and Sexual Activity  . Alcohol use: No  . Drug use: Not Currently    Types: IV, Heroin    Comment: clean since 10/09/16  . Sexual activity: Not on file  Other Topics Concern  . Not on file  Social History Narrative  . Not on file   Social Determinants of Health   Financial Resource Strain:   . Difficulty of Paying Living Expenses: Not on file  Food Insecurity:   . Worried About Programme researcher, broadcasting/film/video in the Last Year: Not on file  . Ran Out of Food in the Last Year: Not on file  Transportation Needs:   . Lack of Transportation (Medical): Not on file  . Lack of Transportation (Non-Medical): Not on file  Physical Activity:   . Days of Exercise per Week: Not on file  . Minutes of Exercise per Session: Not on file  Stress:   . Feeling of Stress : Not on file  Social Connections:   . Frequency of Communication with Friends and Family: Not on file  . Frequency of Social Gatherings with Friends and Family: Not on file  . Attends Religious Services: Not on file  . Active Member of Clubs or Organizations: Not on file  . Attends Banker Meetings: Not on file  . Marital Status: Not on file     Family History: The patient's family history includes Healthy in her father; Hypotension  in her mother; Hypothyroidism in her mother. ROS:   Please see the history of present illness.    All 14 point review of systems negative except as described per history of present illness  EKGs/Labs/Other Studies Reviewed:      Recent Labs: No results found for requested labs within last 8760 hours.  Recent Lipid Panel    Component Value Date/Time   CHOL 214 (H) 02/26/2017 1425   TRIG 184 (H) 02/26/2017 1425   HDL 45 02/26/2017 1425   CHOLHDL 4.8 (H) 02/26/2017 1425   LDLCALC 132 (H) 02/26/2017 1425    Physical Exam:    VS:  BP 128/70 (BP Location: Right Arm, Patient Position: Sitting)  Pulse 70   Ht 5\' 8"  (1.727 m)   Wt (!) 376 lb (170.6 kg)   SpO2 94%   BMI 57.17 kg/m     Wt Readings from Last 3 Encounters:  01/28/20 (!) 376 lb (170.6 kg)  12/27/18 (!) 336 lb (152.4 kg)  09/09/18 (!) 325 lb (147.4 kg)     GEN:  Well nourished, well developed in no acute distress HEENT: Normal NECK: No JVD; No carotid bruits LYMPHATICS: No lymphadenopathy CARDIAC: RRR, no murmurs, no rubs, no gallops RESPIRATORY:  Clear to auscultation without rales, wheezing or rhonchi  ABDOMEN: Soft, non-tender, non-distended MUSCULOSKELETAL:  No edema; No deformity  SKIN: Warm and dry LOWER EXTREMITIES: no swelling NEUROLOGIC:  Alert and oriented x 3 PSYCHIATRIC:  Normal affect   ASSESSMENT:    1. S/P tricuspid valve repair   2. History of drug abuse (HCC)   3. Morbid obesity (HCC)    PLAN:    In order of problems listed above:  1. Status post tricuspid valve repair.  Auscultation did not reveal any murmur however tonsils are distant.  I will schedule him to have echocardiogram. 2. History of drug abuse: She remains clean.  I encouraged her to stay away from drugs. 3. Morbid obesity gradual problem.  She understand that this is an issue.  I gave her some hints how to do diet, I also encouraged her to go back to exercises.  I think she can benefit tremendously for psychological  support.  I gave her the phone number to psychotherapist to see if she can get established and started discussing her issues. 4. I will ask her to have cholesterol done today we will check complete metabolic panel CBC and TSH.  She have not seen any doctor for long time.   Medication Adjustments/Labs and Tests Ordered: Current medicines are reviewed at length with the patient today.  Concerns regarding medicines are outlined above.  No orders of the defined types were placed in this encounter.  Medication changes: No orders of the defined types were placed in this encounter.   Signed, 09/11/18, MD, [facility] 01/28/2020 2:54 PM    [facility]

## 2020-01-28 NOTE — Telephone Encounter (Signed)
Patient needed appointment and was scheduled for today. No further needs.

## 2020-01-28 NOTE — Telephone Encounter (Signed)
Left message for patient to return call.

## 2020-01-29 ENCOUNTER — Telehealth: Payer: Self-pay | Admitting: Cardiology

## 2020-01-29 LAB — CBC
Hematocrit: 37.9 % (ref 34.0–46.6)
Hemoglobin: 12.7 g/dL (ref 11.1–15.9)
MCH: 30.2 pg (ref 26.6–33.0)
MCHC: 33.5 g/dL (ref 31.5–35.7)
MCV: 90 fL (ref 79–97)
Platelets: 239 10*3/uL (ref 150–450)
RBC: 4.21 x10E6/uL (ref 3.77–5.28)
RDW: 12.8 % (ref 11.7–15.4)
WBC: 5.8 10*3/uL (ref 3.4–10.8)

## 2020-01-29 LAB — LIPID PANEL
Chol/HDL Ratio: 5.2 ratio — ABNORMAL HIGH (ref 0.0–4.4)
Cholesterol, Total: 182 mg/dL (ref 100–199)
HDL: 35 mg/dL — ABNORMAL LOW (ref >39)
LDL Chol Calc (NIH): 90 mg/dL (ref 0–99)
Triglycerides: 342 mg/dL — ABNORMAL HIGH (ref 0–149)
VLDL Cholesterol Cal: 57 mg/dL — ABNORMAL HIGH (ref 5–40)

## 2020-01-29 LAB — COMPREHENSIVE METABOLIC PANEL
ALT: 18 IU/L (ref 0–32)
AST: 25 IU/L (ref 0–40)
Albumin/Globulin Ratio: 1.4 (ref 1.2–2.2)
Albumin: 4.1 g/dL (ref 3.8–4.8)
Alkaline Phosphatase: 72 IU/L (ref 44–121)
BUN/Creatinine Ratio: 16 (ref 9–23)
BUN: 16 mg/dL (ref 6–20)
Bilirubin Total: <0.2 mg/dL (ref 0.0–1.2)
CO2: 26 mmol/L (ref 20–29)
Calcium: 9.7 mg/dL (ref 8.7–10.2)
Chloride: 98 mmol/L (ref 96–106)
Creatinine, Ser: 1 mg/dL (ref 0.57–1.00)
GFR calc Af Amer: 84 mL/min/{1.73_m2} (ref >59)
GFR calc non Af Amer: 73 mL/min/{1.73_m2} (ref >59)
Globulin, Total: 3 g/dL (ref 1.5–4.5)
Glucose: 89 mg/dL (ref 65–99)
Potassium: 4.3 mmol/L (ref 3.5–5.2)
Sodium: 139 mmol/L (ref 134–144)
Total Protein: 7.1 g/dL (ref 6.0–8.5)

## 2020-01-29 LAB — TSH: TSH: 3.99 u[IU]/mL (ref 0.450–4.500)

## 2020-01-29 NOTE — Telephone Encounter (Signed)
Called patient informed her of results.  

## 2020-01-29 NOTE — Telephone Encounter (Signed)
Bernestine is returning Hayley's call. She states if she does not answer when calling back a detailed message with her results can be left. Please advise.

## 2020-02-06 ENCOUNTER — Other Ambulatory Visit: Payer: Self-pay | Admitting: Cardiology

## 2020-02-09 NOTE — Telephone Encounter (Signed)
Rx request sent to pharmacy.  

## 2020-02-11 NOTE — Telephone Encounter (Signed)
Patient states that we told her that her EKG results were normal but when she went to [facility] stated that the EKG results abnormal and lowered her dosage. They want the patient to get another EKG and some form a note from Dr. Bing Matter stating what results are. Please call/advise what the next steps should be

## 2020-02-13 NOTE — Telephone Encounter (Signed)
Spoke to patient. Informed her of the message below. She said that Dr. Bing Matter needs to talk with the doctor at the clinic so she is going to figure out the best way for [place] to set that up. She will call us back with more information.

## 2020-02-13 NOTE — Telephone Encounter (Signed)
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  So this is expected EKG for her, I suspect the reason why the reduced dose of methadone is the fact that he does have prolongation of QT interval.  Prolongation of QT interval is related to right bundle branch block.  And this is not real prolongation.  Ideally in this situation we need to look at JT interval.  And that is perfectly normal.  So if they cut down the dose because of prolonged QT interval that is inappropriate.

## 2020-02-16 ENCOUNTER — Telehealth: Payer: Self-pay | Admitting: Cardiology

## 2020-02-16 NOTE — Telephone Encounter (Signed)
Yes we can write letter like this letter should say that changes on the EKG that we see which is bundle branch block because prolongation of QT interval.  That should not affect the dose of methadone.

## 2020-02-16 NOTE — Telephone Encounter (Signed)
New Message:    Pt says she needs to talk to Dr Vanetta Shawl nurse about a letter she needs for her other doctor please.

## 2020-02-16 NOTE — Telephone Encounter (Signed)
Spoke to patient. She needs a letter that states her ekg is ok and that her methadone dose shouldn't have been changed based on th ekg as this is normal for her. Will consult with Dr. Bing Matter.

## 2020-02-17 ENCOUNTER — Encounter: Payer: Self-pay | Admitting: Emergency Medicine

## 2020-02-17 NOTE — Telephone Encounter (Signed)
Letter sent to [place] [facility].

## 2020-02-17 NOTE — Telephone Encounter (Signed)
Patient calling to follow up on the letter. She states it can be emailed to her at: [email or web address]

## 2020-02-17 NOTE — Telephone Encounter (Signed)
Letter sent to [place] office where patient is

## 2020-02-18 ENCOUNTER — Ambulatory Visit (HOSPITAL_BASED_OUTPATIENT_CLINIC_OR_DEPARTMENT_OTHER)
Admission: RE | Admit: 2020-02-18 | Discharge: 2020-02-18 | Disposition: A | Discharge status: Home / Self Care | Payer: Self-pay | Source: Ambulatory Visit | Attending: Cardiology | Admitting: Cardiology

## 2020-02-18 DIAGNOSIS — F1911 Other psychoactive substance abuse, in remission: Secondary | ICD-10-CM

## 2020-02-18 DIAGNOSIS — Z9889 Other specified postprocedural states: Secondary | ICD-10-CM

## 2020-02-19 ENCOUNTER — Inpatient Hospital Stay (HOSPITAL_COMMUNITY): Admission: RE | Admit: 2020-02-19 | Payer: Self-pay | Source: Ambulatory Visit

## 2020-02-22 ENCOUNTER — Encounter (HOSPITAL_BASED_OUTPATIENT_CLINIC_OR_DEPARTMENT_OTHER): Payer: Self-pay | Admitting: Emergency Medicine

## 2020-02-22 ENCOUNTER — Emergency Department (HOSPITAL_BASED_OUTPATIENT_CLINIC_OR_DEPARTMENT_OTHER)
Admission: EM | Admit: 2020-02-22 | Discharge: 2020-02-22 | Disposition: A | Discharge status: Home / Self Care | Payer: Self-pay | Attending: Emergency Medicine | Admitting: Emergency Medicine

## 2020-02-22 ENCOUNTER — Other Ambulatory Visit: Payer: Self-pay

## 2020-02-22 DIAGNOSIS — K432 Incisional hernia without obstruction or gangrene: Secondary | ICD-10-CM | POA: Insufficient documentation

## 2020-02-22 DIAGNOSIS — Z87891 Personal history of nicotine dependence: Secondary | ICD-10-CM | POA: Insufficient documentation

## 2020-02-22 LAB — URINALYSIS, ROUTINE W REFLEX MICROSCOPIC
Bilirubin Urine: NEGATIVE
Glucose, UA: NEGATIVE mg/dL
Ketones, ur: NEGATIVE mg/dL
Leukocytes,Ua: NEGATIVE
Nitrite: NEGATIVE
Protein, ur: NEGATIVE mg/dL
Specific Gravity, Urine: 1.015 (ref 1.005–1.030)
pH: 7 (ref 5.0–8.0)

## 2020-02-22 LAB — CBC
HCT: 37.4 % (ref 36.0–46.0)
Hemoglobin: 12.4 g/dL (ref 12.0–15.0)
MCH: 30.2 pg (ref 26.0–34.0)
MCHC: 33.2 g/dL (ref 30.0–36.0)
MCV: 91.2 fL (ref 80.0–100.0)
Platelets: 208 10*3/uL (ref 150–400)
RBC: 4.1 MIL/uL (ref 3.87–5.11)
RDW: 12.1 % (ref 11.5–15.5)
WBC: 6.7 10*3/uL (ref 4.0–10.5)
nRBC: 0 % (ref 0.0–0.2)

## 2020-02-22 LAB — COMPREHENSIVE METABOLIC PANEL
ALT: 18 U/L (ref 0–44)
AST: 24 U/L (ref 15–41)
Albumin: 3.6 g/dL (ref 3.5–5.0)
Alkaline Phosphatase: 57 U/L (ref 38–126)
Anion gap: 8 (ref 5–15)
BUN: 13 mg/dL (ref 6–20)
CO2: 28 mmol/L (ref 22–32)
Calcium: 9 mg/dL (ref 8.9–10.3)
Chloride: 100 mmol/L (ref 98–111)
Creatinine, Ser: 0.82 mg/dL (ref 0.44–1.00)
GFR, Estimated: >60 mL/min (ref >60)
Glucose, Bld: 112 mg/dL — ABNORMAL HIGH (ref 70–99)
Potassium: 4 mmol/L (ref 3.5–5.1)
Sodium: 136 mmol/L (ref 135–145)
Total Bilirubin: 0.4 mg/dL (ref 0.3–1.2)
Total Protein: 7.1 g/dL (ref 6.5–8.1)

## 2020-02-22 LAB — PREGNANCY, URINE: Preg Test, Ur: NEGATIVE

## 2020-02-22 LAB — URINALYSIS, MICROSCOPIC (REFLEX)

## 2020-02-22 LAB — LIPASE, BLOOD: Lipase: 30 U/L (ref 11–51)

## 2020-02-22 NOTE — ED Triage Notes (Signed)
She thinks she has a hernia. States she has a painful bulge in her abd for a few weeks.

## 2020-02-22 NOTE — ED Provider Notes (Signed)
[facility] EMERGENCY DEPARTMENT Provider Note   CSN: [id number] Arrival date & time: 02/22/20  1213     History Chief Complaint  Patient presents with  . Abdominal Pain    Heather Day is a 34 y.o. female with PMH significant for IVDA, bipolar disorder, GERD, hepatitis C, and multiple abdominal surgeries including cesarean section, laparoscopic appendectomy, and cholecystectomy performed 01/15/2018 who presents the ED with complaints of painful bulge in epigastrium.  On my examination, patient reports that she has noticed this intermittently painful bulge in her epigastrium periodically over the course the past couple of months.  She states that it was particularly uncomfortable last week, but then she pushed on it and it started to feel better.  Worse with increased intra-abdominal pressure.  Her last bowel movement was this morning, normal.  She is still passing gas without difficulty.  No reported fevers or chills.  No inability tolerate food or liquid by mouth.  No nausea or vomiting.  HPI     Past Medical History:  Diagnosis Date  . Anxiety    panic attacks  . Bipolar disorder (HCC)   . Drug abuse, IV (HCC)   . Dyspnea    when walking up stairs  . Gallstone   . GERD (gastroesophageal reflux disease)   . Heartburn   . Hepatitis C   . Periodontitis chronic, apical    dental caries  . Pre-diabetes   . PTSD (post-traumatic stress disorder)   . 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 Severe tricuspid regurgitation   . Wears glasses   . Wound infection after surgery    Sternal wound    Patient Active Problem List   Diagnosis Date Noted  . Morbid obesity (HCC) 01/28/2020  . Hepatitis C   . Sternal wound dehiscence 05/13/2018  . 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 History of drug  abuse (HCC) 02/26/2017  . Smoking 02/26/2017    Past Surgical History:  Procedure Laterality Date  . APPENDECTOMY    . APPLICATION OF WOUND VAC N/A 05/16/2018   Procedure: APPLICATION OF WOUND VAC;  Surgeon: Purcell Nails, MD;  Location: [facility] OR;  Service: Thoracic;  Laterality: N/A;  . CARDIAC SURGERY    . CESAREAN SECTION    . CHOLECYSTECTOMY N/A 01/15/2018   Procedure: LAPAROSCOPIC CHOLECYSTECTOMY ERAS PATHWAY;  Surgeon: Glenna Fellows, MD;  Location: [facility] OR;  Service: General;  Laterality: N/A;  . INTRAOPERATIVE CHOLANGIOGRAM N/A 01/15/2018   Procedure: INTRAOPERATIVE CHOLANGIOGRAM;  Surgeon: Glenna Fellows, MD;  Location: [facility] OR;  Service: General;  Laterality: N/A;  . LAPAROSCOPIC APPENDECTOMY N/A 07/04/2017   Procedure: APPENDECTOMY LAPAROSCOPIC;  Surgeon: Luretha Murphy, MD;  Location: [facility] ORS;  Service: General;  Laterality: N/A;  . MULTIPLE EXTRACTIONS WITH ALVEOLOPLASTY Bilateral 11/29/2017   Procedure: Extraction of tooth #'s 2,5-15, and 31 with alveoloplasty and gross debridement of remaining dentition;  Surgeon: Charlynne Pander, DDS;  Location: [facility] OR;  Service: Oral Surgery;  Laterality: Bilateral;  . STERNAL WOUND DEBRIDEMENT N/A 05/16/2018   Procedure: STERNAL WOUND DEBRIDEMENT with Sternal wire removal x2.;  Surgeon: Purcell Nails, MD;  Location: [facility] OR;  Service: Thoracic;  Laterality: N/A;  . TEE WITHOUT CARDIOVERSION N/A 10/03/2017   Procedure: TRANSESOPHAGEAL ECHOCARDIOGRAM (TEE);  Surgeon: Jodelle Red, MD;  Location: [facility] ENDOSCOPY;  Service: Cardiovascular;  Laterality: N/A;  . TEE WITHOUT  CARDIOVERSION N/A 04/18/2018   Procedure: TRANSESOPHAGEAL ECHOCARDIOGRAM (TEE);  Surgeon: Purcell Nailswen, Clarence H, MD;  [facility];  Service: Open Heart Surgery;  Laterality: N/A;  . TRICUSPID VALVE REPLACEMENT N/A 04/18/2018   Procedure: TRICUSPID VALVE REPAIR;  Surgeon: Purcell Nailswen, Clarence H, MD;  [facility];  Service: Open Heart Surgery;  Laterality: N/A;     OB History    No obstetric history on file.     Family History  Problem Relation Age of Onset  . Hypothyroidism Mother   . Hypotension Mother   . Healthy Father     Social History   Tobacco Use  . Smoking status: Former Smoker    Packs/day: 1.00    Years: 20.00    Pack years: 20.00    Types: Cigarettes    Quit date: 04/26/2017    Years since quitting: 2.8  . Smokeless tobacco: Never Used  Vaping Use  . Vaping Use: Never used  Substance Use Topics  . Alcohol use: No  . Drug use: Not Currently    Types: IV, Heroin    Comment: clean since 10/09/16    Home Medications Prior to Admission medications   Medication Sig Start Date End Date Taking? Authorizing Provider  acetaminophen (TYLENOL) 325 MG tablet Take 2 tablets (650 mg total) by mouth every 4 (four) hours as needed for mild pain (or Fever >/= 101). Patient not taking: Reported on 01/28/2020 05/17/18   Sharlene Doryonte, Tessa N, PA-C  aspirin EC 325 MG EC tablet Take 1 tablet (325 mg total) by mouth daily. Patient not taking: Reported on 01/28/2020 04/24/18   Gershon CraneGold, Wayne E, PA-C  furosemide (LASIX) 40 MG tablet Take 1 tablet (40 mg total) by mouth daily. 02/09/20   Georgeanna LeaKrasowski, Robert J, MD  ibuprofen (ADVIL,MOTRIN) 200 MG tablet Take 600 mg by mouth every 6 (six) hours as needed. Patient not taking: Reported on 01/28/2020    [provider]  methadone (DOLOPHINE) 10 MG/5ML solution Take 85 mg by mouth daily.  Patient not taking: Reported on 01/28/2020    [provider]  Multiple Vitamin (MULTIVITAMIN WITH MINERALS) TABS tablet Take 1 tablet by mouth daily. Patient not taking: Reported on 01/28/2020    [provider]  omeprazole (PRILOSEC) 20 MG capsule TAKE 1 CAPSULE BY MOUTH TWICE DAILY BEFORE MEAL(S) 02/09/20   Georgeanna LeaKrasowski, Robert J, MD  potassium chloride (KLOR-CON) 10 MEQ tablet Take 1 tablet (10 mEq total) by mouth daily. 02/09/20   Georgeanna LeaKrasowski, Robert J, MD  potassium chloride (KLOR-CON) 20 MEQ packet Take by mouth  daily.    [provider]    Allergies    Patient has no known allergies.  Review of Systems   Review of Systems  All other systems reviewed and are negative.   Physical Exam Updated Vital Signs BP 116/70 (BP Location: Left Arm)   Pulse 60   Temp 98.1 F (36.7 C) (Oral)   Resp 18   Ht 5\' 8"  (1.727 m)   Wt (!) 158.8 kg   LMP 02/17/2020   SpO2 99%   BMI 53.22 kg/m   Physical Exam Vitals and nursing note reviewed. Exam conducted with a chaperone present.  Constitutional:      General: She is not in acute distress.    Appearance: Normal appearance. She is obese.  HENT:     Head: Normocephalic and atraumatic.  Eyes:     General: No scleral icterus.    Conjunctiva/sclera: Conjunctivae normal.  Cardiovascular:     Rate  and Rhythm: Normal rate.  Pulmonary:     Effort: Pulmonary effort is normal. No respiratory distress.  Abdominal:     General: There is no distension.     Palpations: Abdomen is soft.     Tenderness: There is no guarding.     Hernia: A hernia is present.     Comments: Soft, nondistended.  Discrete 2 x 2 centimeter bulge in epigastrium underneath incision from prior cholecystectomy.  No overlying skin changes.  Only mild focal tenderness.  No guarding.  Low suspicion for incarceration.  Musculoskeletal:     Right lower leg: Edema present.     Left lower leg: Edema present.     Comments: Mild 1+ pitting edema.  Skin:    General: Skin is dry.     Capillary Refill: Capillary refill takes less than 2 seconds.  Neurological:     Mental Status: She is alert and oriented to person, place, and time.     GCS: GCS eye subscore is 4. GCS verbal subscore is 5. GCS motor subscore is 6.  Psychiatric:        Mood and Affect: Mood normal.        Behavior: Behavior normal.        Thought Content: Thought content normal.         ED Results / Procedures / Treatments   Labs (all labs ordered are listed, but only abnormal results are displayed) Labs  Reviewed  COMPREHENSIVE METABOLIC PANEL - Abnormal; Notable for the following components:      Result Value   Glucose, Bld 112 (*)    All other components within normal limits  URINALYSIS, ROUTINE W REFLEX MICROSCOPIC - Abnormal; Notable for the following components:   Hgb urine dipstick LARGE (*)    All other components within normal limits  URINALYSIS, MICROSCOPIC (REFLEX) - Abnormal; Notable for the following components:   Bacteria, UA FEW (*)    All other components within normal limits  LIPASE, BLOOD  CBC  PREGNANCY, URINE    EKG None  Radiology No results found.  Procedures Procedures (including critical care time)  Medications Ordered in ED Medications - No data to display  ED Course  I have reviewed the triage vital signs and the nursing notes.  Pertinent labs & imaging results that were available during my care of the patient were reviewed by me and considered in my medical decision making (see chart for details).    MDM Rules/Calculators/A&P                          Her history physical exam is suggestive of incisional hernia, not incarcerated.  She is not in any current distress or significant pain.  She states that it was worse last week, but resolved after she pushed on it.  She has only been dealing with this for a couple of months.  She denies any symptoms concerning for obstruction.  She is still passing gas regularly and had a normal bowel movement earlier today.  No diminished p.o. intake or inability to tolerate food or liquid by mouth.  No fevers, chills, or other systemic symptoms.  We discussed very strict return precautions, but do not feel as though laboratory work-up or imaging is warranted at this time.  Instead, will refer to general surgery given clinical concern for incisional hernia.  I also highly encouraged her to get established with a primary care provider for ongoing evaluation and management of  her health wellbeing.  Will refer her to [facility].  I also encouraged her to consider compression stockings for her lower extremity edema.  Patient and husband voice understanding and are agreeable to the plan.     Final Clinical Impression(s) / ED Diagnoses Final diagnoses:  Incisional hernia, without obstruction or gangrene    Rx / DC Orders ED Discharge Orders         Ordered    Ambulatory referral to General Surgery        02/22/20 1455           Kei[place], Diep 02/22/20 1459    Terrilee Files, MD 02/23/20 1421

## 2020-02-22 NOTE — Discharge Instructions (Signed)
Your history and physical exam is suggestive of incisional hernia.  I have placed an ambulatory referral, you will likely receive a phone call about scheduling an urgent appointment for general surgery. They will evaluate you and determine most appropriate management.   I would also like for you to follow-up with the [facility] to get established with her primary care provider.  I encourage you to consider compression stockings for your lower extremity swelling and edema.  Return to the ED or seek immediate medical attention should you experience any new or worsening symptoms.  Specifically, if you are unable to pass gas, have a bowel movement, intractable nausea and vomiting, fevers, or cannot reduce the hernia (push it back down).

## 2020-02-22 NOTE — ED Notes (Signed)
approx 3 weeks had onset of abd pain, 1 year ago had a cholecystectomy, pain has increased over time, this past Thursday pain was at its worse, felt full in abd. Was able to eat yesterday, has had some nausea. States it "feels like a ball" pt points to superior portion of umbilicus.

## 2020-03-11 ENCOUNTER — Other Ambulatory Visit: Payer: Self-pay

## 2020-03-11 ENCOUNTER — Encounter: Payer: Self-pay | Admitting: General Surgery

## 2020-03-11 ENCOUNTER — Ambulatory Visit (INDEPENDENT_AMBULATORY_CARE_PROVIDER_SITE_OTHER): Payer: Self-pay | Admitting: General Surgery

## 2020-03-11 VITALS — BP 124/85 | HR 82 | Temp 98.8°F | Resp 18 | Ht 67.0 in | Wt 373.0 lb

## 2020-03-11 DIAGNOSIS — R1013 Epigastric pain: Secondary | ICD-10-CM

## 2020-03-11 DIAGNOSIS — K432 Incisional hernia without obstruction or gangrene: Secondary | ICD-10-CM

## 2020-03-11 NOTE — Progress Notes (Signed)
[facility] History and Physical  Reason for Referral: Hernia  Referring Physician: ED   Chief Complaint    New Patient (Initial Visit)      Heather Day is a 34 y.o. female.  HPI:  Heather Day is a 34 yo who has anxiety, bipolar, prior history of drug use on Methodone, hepatitis C, prior tricuspid valve repair for regurgitation and recent report of bulges in her upper abdomen that she says has been tender but has also moved. She thought it was located close to one of her drain exit sites for her open heart surgery.  She also says she has noticed a bulge lower down above her umbilicus.  She has had a laparoscopic appendectomy, cholecystectomy and C section in the past.  She says that when she eats or has not had a BM that the pressure and pain are worse.  She can push on the bulge and it goes back in.  No reported nausea or vomiting.   She says that her last surgery was her tricuspid valve repair and that she has not had the SOB or orthopnea like she did prior to the surgery.  She is not on a blood thinner.  She sees her cardiologist Dr. Bing Matter and saw him in November.   Past Medical History:  Diagnosis Date  . Anxiety    panic attacks  . Bipolar disorder (HCC)   . Drug abuse, IV (HCC)   . Dyspnea    when walking up stairs  . Gallstone   . GERD (gastroesophageal reflux disease)   . Heartburn   . Hepatitis C   . Periodontitis chronic, apical    dental caries  . Pre-diabetes   . PTSD (post-traumatic stress disorder)   . 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 Severe tricuspid regurgitation   . Wears glasses   . Wound infection after surgery    Sternal wound    Past Surgical History:  Procedure Laterality Date  . APPENDECTOMY    . APPLICATION OF WOUND VAC N/A 05/16/2018   Procedure:  APPLICATION OF WOUND VAC;  Surgeon: Purcell Nails, MD;  Location: [facility] OR;  Service: Thoracic;  Laterality: N/A;  . CARDIAC SURGERY    . CESAREAN SECTION    . CHOLECYSTECTOMY N/A 01/15/2018   Procedure: LAPAROSCOPIC CHOLECYSTECTOMY ERAS PATHWAY;  Surgeon: Glenna Fellows, MD;  Location: [facility] OR;  Service: General;  Laterality: N/A;  . INTRAOPERATIVE CHOLANGIOGRAM N/A 01/15/2018   Procedure: INTRAOPERATIVE CHOLANGIOGRAM;  Surgeon: Glenna Fellows, MD;  Location: [facility] OR;  Service: General;  Laterality: N/A;  . LAPAROSCOPIC APPENDECTOMY N/A 07/04/2017   Procedure: APPENDECTOMY LAPAROSCOPIC;  Surgeon: Luretha Murphy, MD;  Location: [facility] ORS;  Service: General;  Laterality: N/A;  . MULTIPLE EXTRACTIONS WITH ALVEOLOPLASTY Bilateral 11/29/2017   Procedure: Extraction of tooth #'s 2,5-15, and 31 with alveoloplasty and gross debridement of remaining dentition;  Surgeon: Charlynne Pander, DDS;  Location: [facility] OR;  Service: Oral Surgery;  Laterality: Bilateral;  . STERNAL WOUND DEBRIDEMENT N/A 05/16/2018   Procedure: STERNAL WOUND DEBRIDEMENT with Sternal wire removal x2.;  Surgeon: Purcell Nails, MD;  Location: [facility] OR;  Service: Thoracic;  Laterality: N/A;  . TEE WITHOUT CARDIOVERSION N/A 10/03/2017   Procedure: TRANSESOPHAGEAL ECHOCARDIOGRAM (TEE);  Surgeon: Jodelle Red, MD;  Location: [facility] ENDOSCOPY;  Service: Cardiovascular;  Laterality: N/A;  . TEE WITHOUT CARDIOVERSION  N/A 04/18/2018   Procedure: TRANSESOPHAGEAL ECHOCARDIOGRAM (TEE);  Surgeon: Purcell Nails, MD;  Location: [facility] OR;  Service: Open Heart Surgery;  Laterality: N/A;  . TRICUSPID VALVE REPLACEMENT N/A 04/18/2018   Procedure: TRICUSPID VALVE REPAIR;  Surgeon: Purcell Nails, MD;  Location[facility] OR;  Service: Open Heart Surgery;  Laterality: N/A;    Family History  Problem Relation Age of Onset  . Hypothyroidism Mother   . Hypotension Mother   . Healthy Father     Social History   Tobacco Use  . Smoking status: Former Smoker     Packs/day: 1.00    Years: 20.00    Pack years: 20.00    Types: Cigarettes    Quit date: 04/26/2017    Years since quitting: 2.8  . Smokeless tobacco: Never Used  Vaping Use  . Vaping Use: Never used  Substance Use Topics  . Alcohol use: No  . Drug use: Not Currently    Types: IV, Heroin    Comment: clean since 10/09/16    Medications: I have reviewed the patient's current medications. Allergies as of 03/11/2020   No Known Allergies     Medication List       Accurate as of March 11, 2020 11:59 PM. If you have any questions, ask your nurse or doctor.        STOP taking these medications   acetaminophen 325 MG tablet Commonly known as: TYLENOL Stopped by: Lucretia Roers, MD   aspirin 325 MG EC tablet Stopped by: Lucretia Roers, MD   ibuprofen 200 MG tablet Commonly known as: ADVIL Stopped by: Lucretia Roers, MD   multivitamin with minerals Tabs tablet Stopped by: Lucretia Roers, MD   potassium chloride 20 MEQ packet Commonly known as: KLOR-CON Stopped by: Lucretia Roers, MD     TAKE these medications   furosemide 40 MG tablet Commonly known as: LASIX Take 1 tablet (40 mg total) by mouth daily.   methadone 10 MG/5ML solution Commonly known as: DOLOPHINE Take 85 mg by mouth daily.   omeprazole 20 MG capsule Commonly known as: PRILOSEC TAKE 1 CAPSULE BY MOUTH TWICE DAILY BEFORE MEAL(S) What changed: See the new instructions.   potassium chloride 10 MEQ tablet Commonly known as: KLOR-CON Take 1 tablet (10 mEq total) by mouth daily.        ROS:  A comprehensive review of systems was negative except for: Constitutional: positive for weight gain Respiratory: positive for SOB Gastrointestinal: positive for abdominal pain Musculoskeletal: positive for leg swelling  Blood pressure 124/85, pulse 82, temperature 98.8 F (37.1 C), temperature source Oral, resp. rate 18, height 5\' 7"  (1.702 m), weight (!) 373 lb (169.2 kg), last  menstrual period 02/17/2020, SpO2 96 %. Physical Exam Vitals reviewed.  Constitutional:      Appearance: She is obese.  HENT:     Head: Normocephalic.     Nose: Nose normal.     Mouth/Throat:     Mouth: Mucous membranes are moist.  Eyes:     Extraocular Movements: Extraocular movements intact.     Pupils: Pupils are equal, round, and reactive to light.  Abdominal:     General: There is no distension.     Palpations: Abdomen is soft.     Tenderness: There is abdominal tenderness.     Comments: Upper epigastric scars from drain tubes from open heart surgery, tender but no obvious fascia defect, diastasis recti, possible supraumbilical bulge but difficult to appreciate  Neurological:     Mental Status: She is alert.     Results: None   Assessment & Plan:  AYEN VIVIANO is a 34 y.o. female with possible hernia in the supraumbilical region but concern for pain in the general area and multiple scars.  She has a history of tricuspid valve repair and uses Methodone and is in a contract.   Discussed hernia recurrence risk with obesity, diabetes and smoking. Discussed need for use of mesh.   -Will need to get a CT to further evaluate for any defect  -Will need to discuss need for any cardiac clearance with cardiology prior to surgery  -Will need to get in touch with her methodone clinic to ensure plans for post operative pain management  -Will call with CT results.   All questions were answered to the satisfaction of the patient.    Lucretia Roers 03/13/2020, 8:02 PM

## 2020-03-11 NOTE — Patient Instructions (Addendum)
Will get CT and call with results. If we decide to repair hernia will need to get cardiology to ok surgery and will need to have plan with your pain clinic regarding pain medication.   Ventral Hernia  A ventral hernia is a bulge of tissue from inside the abdomen that pushes through a weak area of the muscles that form the front wall of the abdomen. The tissues inside the abdomen are inside a sac (peritoneum). These tissues include the small intestine, large intestine, and the fatty tissue that covers the intestines (omentum). Sometimes, the bulge that forms a hernia contains intestines. Other hernias contain only fat. Ventral hernias do not go away without surgical treatment. There are several types of ventral hernias. You may have:  A hernia at an incision site from previous abdominal surgery (incisional hernia).  A hernia just above the belly button (epigastric hernia), or at the belly button (umbilical hernia). These types of hernias can develop from heavy lifting or straining.  A hernia that comes and goes (reducible hernia). It may be visible only when you lift or strain. This type of hernia can be pushed back into the abdomen (reduced).  A hernia that traps abdominal tissue inside the hernia (incarcerated hernia). This type of hernia does not reduce.  A hernia that cuts off blood flow to the tissues inside the hernia (strangulated hernia). The tissues can start to die if this happens. This is a very painful bulge that cannot be reduced. A strangulated hernia is a medical emergency. What are the causes? This condition is caused by abdominal tissue putting pressure on an area of weakness in the abdominal muscles. What increases the risk? The following factors may make you more likely to develop this condition:  Being female.  Being 61 or older.  Being overweight or obese.  Having had previous abdominal surgery, especially if there was an infection after surgery.  Having had an injury  to the abdominal wall.  Having had several pregnancies.  Having a buildup of fluid inside the abdomen (ascites). What are the signs or symptoms? The only symptom of a ventral hernia may be a painless bulge in the abdomen. A reducible hernia may be visible only when you strain, cough, or lift. Other symptoms may include:  Dull pain.  A feeling of pressure. Signs and symptoms of a strangulated hernia may include:  Increasing pain.  Nausea and vomiting.  Pain when pressing on the hernia.  The skin over the hernia turning red or purple.  Constipation.  Blood in the stool (feces). How is this diagnosed? This condition may be diagnosed based on:  Your symptoms.  Your medical history.  A physical exam. You may be asked to cough or strain while standing. These actions increase the pressure inside your abdomen and force the hernia through the opening in your muscles. Your health care provider may try to reduce the hernia by pressing on it.  Imaging studies, such as an ultrasound or CT scan. How is this treated? This condition is treated with surgery. If you have a strangulated hernia, surgery is done as soon as possible. If your hernia is small and not incarcerated, you may be asked to lose some weight before surgery. Follow these instructions at home:  Follow instructions from your health care provider about eating or drinking restrictions.  If you are overweight, your health care provider may recommend that you increase your activity level and eat a healthier diet.  Do not lift anything that is  heavier than 10 lb (4.5 kg).  Return to your normal activities as told by your health care provider. Ask your health care provider what activities are safe for you. You may need to avoid activities that increase pressure on your hernia.  Take over-the-counter and prescription medicines only as told by your health care provider.  Keep all follow-up visits as told by your health care  provider. This is important. Contact a health care provider if:  Your hernia gets larger.  Your hernia becomes painful. Get help right away if:  Your hernia becomes increasingly painful.  You have pain along with any of the following: ? Changes in skin color in the area of the hernia. ? Nausea. ? Vomiting. ? Fever. Summary  A ventral hernia is a bulge of tissue from inside the abdomen that pushes through a weak area of the muscles that form the front wall of the abdomen.  This condition is treated with surgery, which may be urgent depending on your hernia.  Do not lift anything that is heavier than 10 lb (4.5 kg), and follow activity instructions from your health care provider. This information is not intended to replace advice given to you by your health care provider. Make sure you discuss any questions you have with your health care provider. Document Revised: 04/11/2017 Document Reviewed: 09/18/2016 Elsevier Patient Education  2020 [employer]. D

## 2020-03-16 ENCOUNTER — Other Ambulatory Visit: Payer: Self-pay | Admitting: Family Medicine

## 2020-03-16 DIAGNOSIS — R1013 Epigastric pain: Secondary | ICD-10-CM

## 2020-03-16 DIAGNOSIS — K432 Incisional hernia without obstruction or gangrene: Secondary | ICD-10-CM

## 2020-03-25 ENCOUNTER — Ambulatory Visit (HOSPITAL_COMMUNITY)
Admission: RE | Admit: 2020-03-25 | Discharge: 2020-03-25 | Disposition: A | Discharge status: Home / Self Care | Payer: Self-pay | Source: Ambulatory Visit | Attending: General Surgery | Admitting: General Surgery

## 2020-03-25 ENCOUNTER — Other Ambulatory Visit: Payer: Self-pay

## 2020-03-25 ENCOUNTER — Other Ambulatory Visit: Payer: Self-pay | Admitting: General Surgery

## 2020-03-25 DIAGNOSIS — K432 Incisional hernia without obstruction or gangrene: Secondary | ICD-10-CM

## 2020-03-25 DIAGNOSIS — R1013 Epigastric pain: Secondary | ICD-10-CM

## 2020-03-25 MED ORDER — IOHEXOL 300 MG/ML  SOLN: 125.0000 mL | Freq: Once | INTRAMUSCULAR | Status: DC | PRN

## 2020-04-08 ENCOUNTER — Other Ambulatory Visit: Payer: Self-pay | Admitting: Cardiology

## 2020-04-28 IMAGING — US US ABDOMEN LIMITED
1 series · 14 of 25 positions shown · non-contrast
Comparison: Ultrasound July 31, 2017.

CLINICAL DATA: Chronic right upper quadrant abdominal pain

EXAM:
ULTRASOUND ABDOMEN LIMITED RIGHT UPPER QUADRANT

[Series 1: us abdomen limited · 0.20mm/px · 14 of 46 slices shown]
[im 1/46]
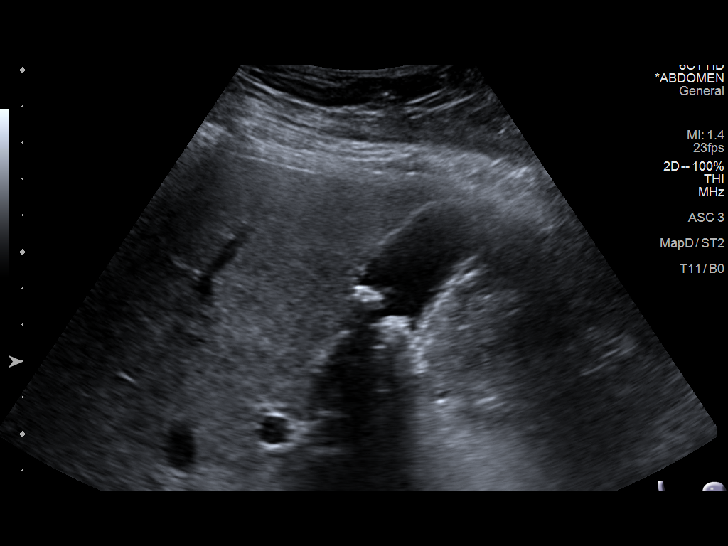
[im 4/46]
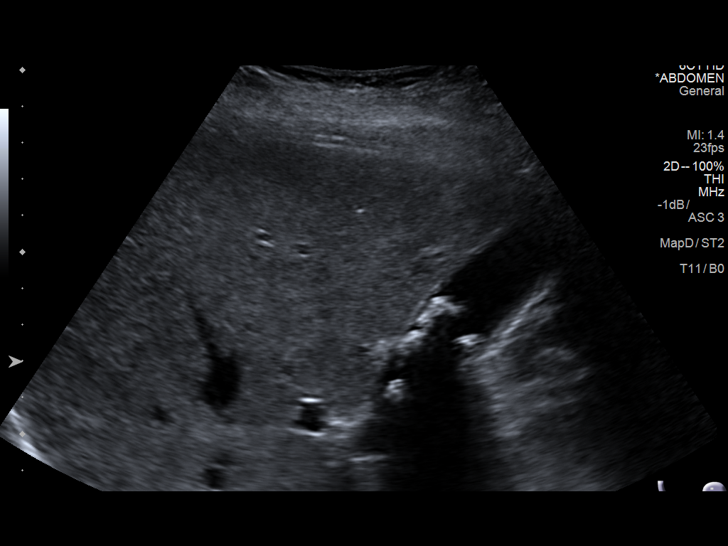
[im 8/46]
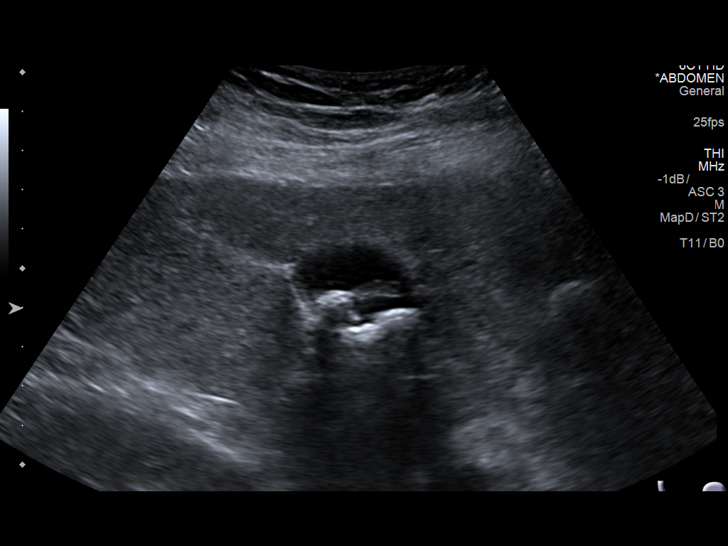
[im 12/46]
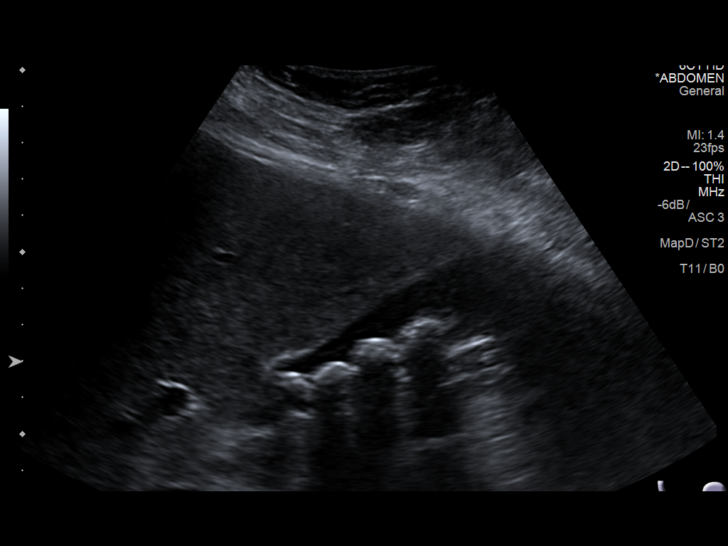
[im 16/46]
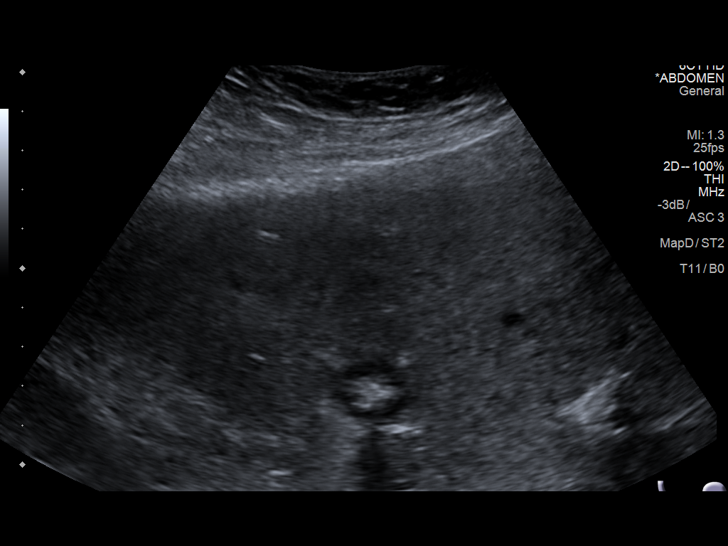
[im 17/46]
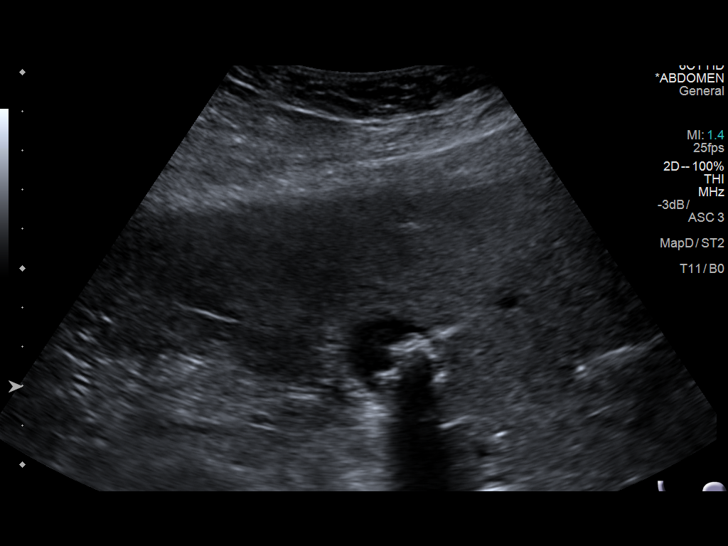
[im 21/46]
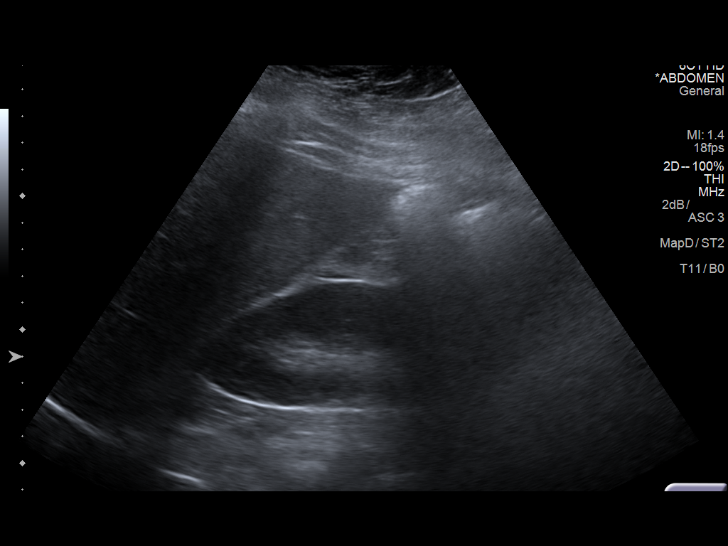
[im 25/46]
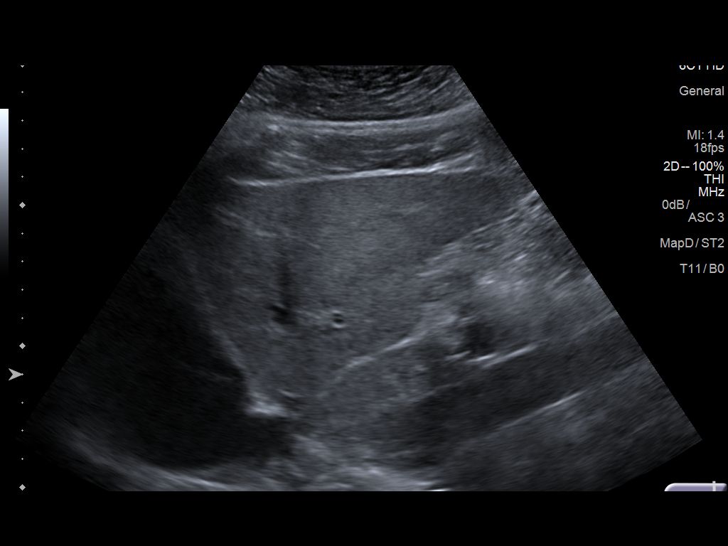
[im 29/46]
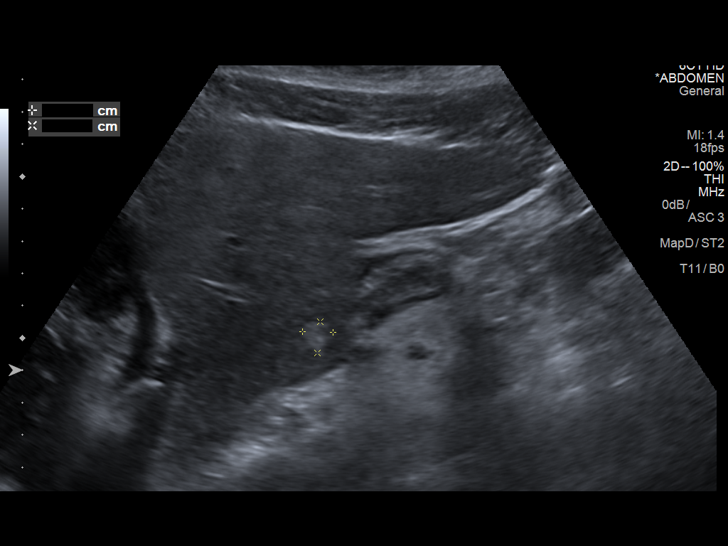
[im 31/46]
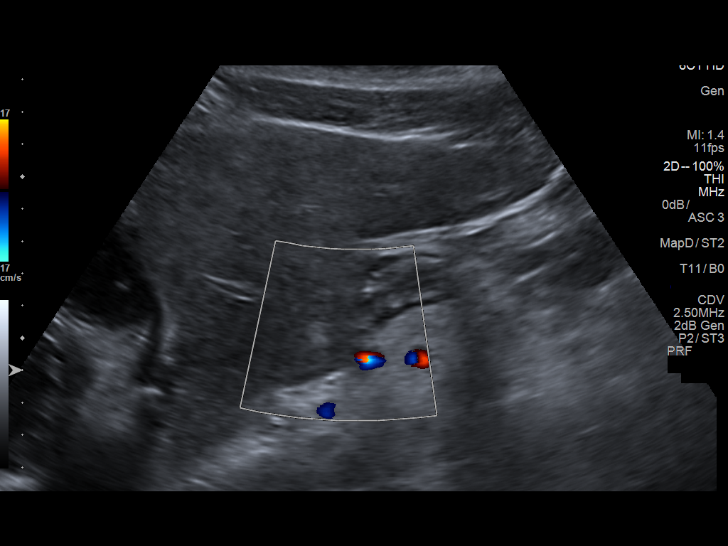
[im 34/46]
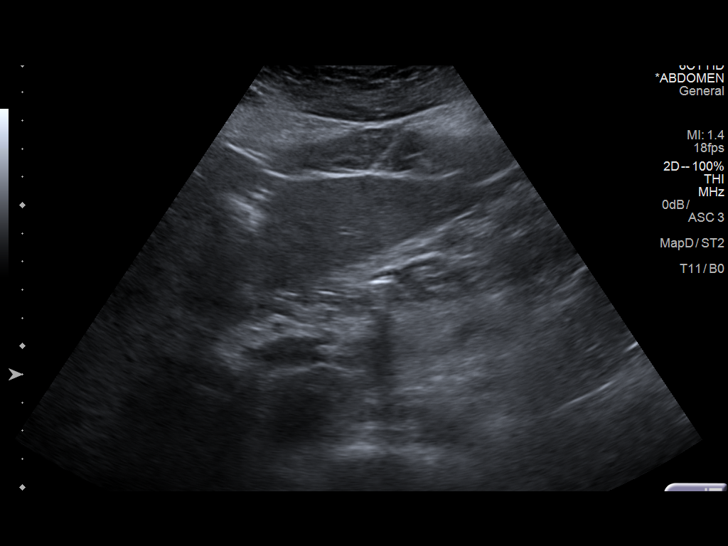
[im 38/46]
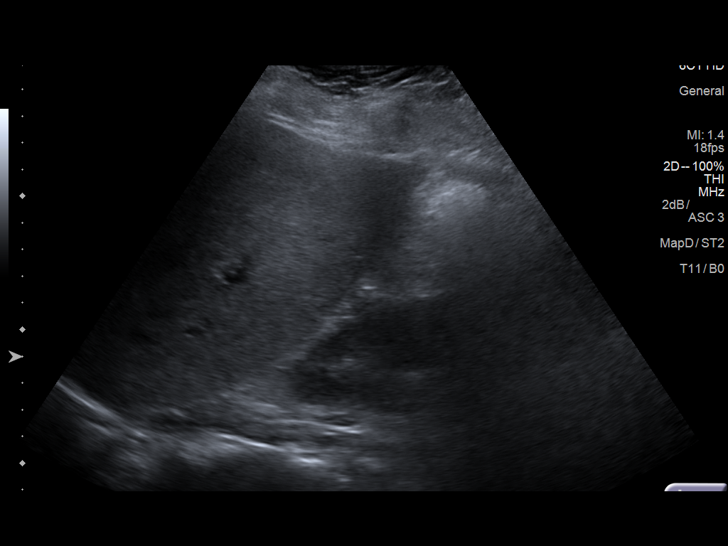
[im 42/46]
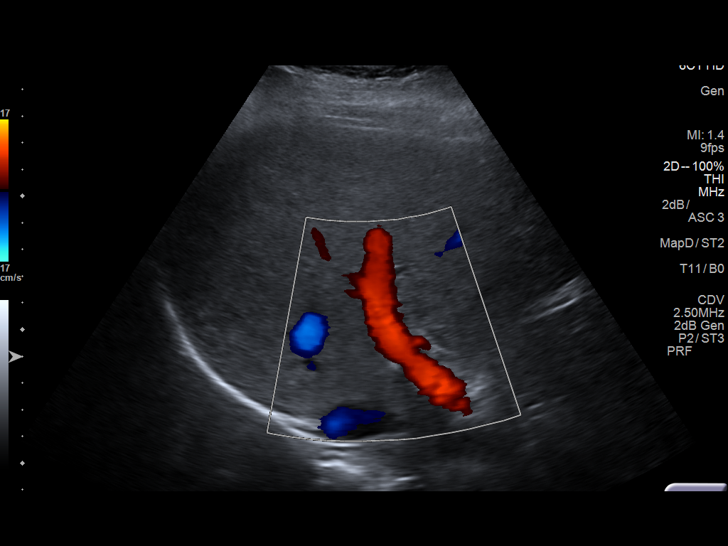
[im 46/46]
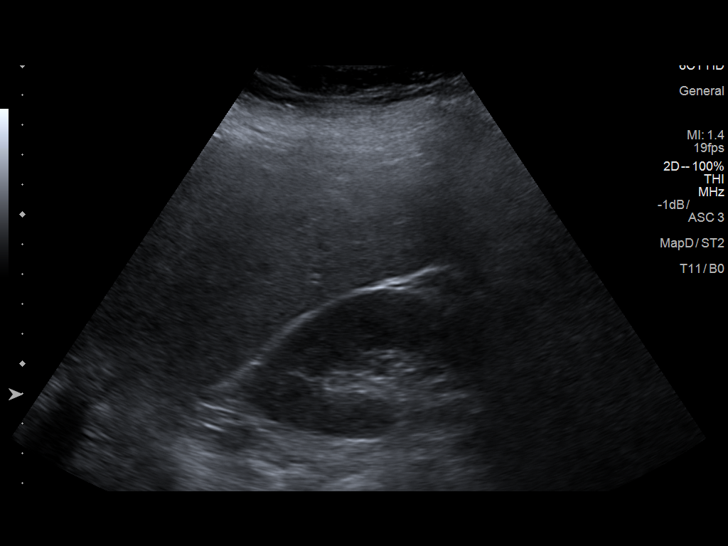

[14 of 25 positions shown; findings below may reference images not displayed]

FINDINGS: Gallbladder:

Cholelithiasis is noted with the largest measuring 1.6 cm. No
gallbladder wall thickening or pericholecystic fluid is noted.
Sludge is noted within gallbladder lumen. Positive sonographic
Murphy's sign is noted.

Common bile duct:

Diameter: 3 mm which is within normal limits.

Liver:

Stable 1 cm rounded echogenicity seen in left hepatic lobe. Within
normal limits in parenchymal echogenicity. Portal vein is patent on
color Doppler imaging with normal direction of blood flow towards
the liver.
IMPRESSION: Cholelithiasis is noted without gallbladder wall thickening or
pericholecystic fluid, although positive sonographic Murphy's sign
is noted. HIDA scan may be performed to evaluate for possible
cholecystitis.

Stable 1 cm rounded echogenicity seen in left hepatic lobe most
consistent with hemangioma. Follow-up ultrasound in 1 year is
recommended to ensure stability.

## 2020-05-25 ENCOUNTER — Other Ambulatory Visit: Payer: Self-pay | Admitting: Family Medicine

## 2020-05-25 DIAGNOSIS — K432 Incisional hernia without obstruction or gangrene: Secondary | ICD-10-CM

## 2020-05-25 DIAGNOSIS — Z01810 Encounter for preprocedural cardiovascular examination: Secondary | ICD-10-CM

## 2020-06-08 ENCOUNTER — Ambulatory Visit (INDEPENDENT_AMBULATORY_CARE_PROVIDER_SITE_OTHER): Payer: Self-pay | Admitting: Cardiology

## 2020-06-08 ENCOUNTER — Other Ambulatory Visit: Payer: Self-pay

## 2020-06-08 ENCOUNTER — Encounter: Payer: Self-pay | Admitting: Cardiology

## 2020-06-08 VITALS — BP 102/50 | HR 76 | Ht 68.0 in | Wt 393.0 lb

## 2020-06-08 DIAGNOSIS — Z9889 Other specified postprocedural states: Secondary | ICD-10-CM

## 2020-06-08 DIAGNOSIS — K432 Incisional hernia without obstruction or gangrene: Secondary | ICD-10-CM

## 2020-06-08 DIAGNOSIS — F1911 Other psychoactive substance abuse, in remission: Secondary | ICD-10-CM

## 2020-06-08 NOTE — Patient Instructions (Signed)
Medication Instructions:  Your physician recommends that you continue on your current medications as directed. Please refer to the Current Medication list given to you today.  *If you need a refill on your cardiac medications before your next appointment, please call your pharmacy*   Lab Work: None. If you have labs (blood work) drawn today and your tests are completely normal, you will receive your results only by: . MyChart Message (if you have MyChart) OR . A paper copy in the mail If you have any lab test that is abnormal or we need to change your treatment, we will call you to review the results.   Testing/Procedures: Your physician has requested that you have an echocardiogram. Echocardiography is a painless test that uses sound waves to create images of your heart. It provides your doctor with information about the size and shape of your heart and how well your heart's chambers and valves are working. This procedure takes approximately one hour. There are no restrictions for this procedure.     Follow-Up: At [facility], you and your health needs are our priority.  As part of our continuing mission to provide you with exceptional heart care, we have created designated Provider Care Teams.  These Care Teams include your primary Cardiologist (physician) and Advanced Practice Providers (APPs -  Physician Assistants and Nurse Practitioners) who all work together to provide you with the care you need, when you need it.  We recommend signing up for the patient portal called "MyChart".  Sign up information is provided on this After Visit Summary.  MyChart is used to connect with patients for Virtual Visits (Telemedicine).  Patients are able to view lab/test results, encounter notes, upcoming appointments, etc.  Non-urgent messages can be sent to your provider as well.   To learn more about what you can do with MyChart, go to [email or web address].    Your next appointment:   6  month(s)  The format for your next appointment:   In Person  Provider:   Robert Krasowski, MD   Other Instructions   Echocardiogram An echocardiogram is a test that uses sound waves (ultrasound) to produce images of the heart. Images from an echocardiogram can provide important information about:  Heart size and shape.  The size and thickness and movement of your heart's walls.  Heart muscle function and strength.  Heart valve function or if you have stenosis. Stenosis is when the heart valves are too narrow.  If blood is flowing backward through the heart valves (regurgitation).  A tumor or infectious growth around the heart valves.  Areas of heart muscle that are not working well because of poor blood flow or injury from a heart attack.  Aneurysm detection. An aneurysm is a weak or damaged part of an artery wall. The wall bulges out from the normal force of blood pumping through the body. Tell a health care provider about:  Any allergies you have.  All medicines you are taking, including vitamins, herbs, eye drops, creams, and over-the-counter medicines.  Any blood disorders you have.  Any surgeries you have had.  Any medical conditions you have.  Whether you are pregnant or may be pregnant. What are the risks? Generally, this is a safe test. However, problems may occur, including an allergic reaction to dye (contrast) that may be used during the test. What happens before the test? No specific preparation is needed. You may eat and drink normally. What happens during the test?  You will take off your   clothes from the waist up and put on a hospital gown.  Electrodes or electrocardiogram (ECG)patches may be placed on your chest. The electrodes or patches are then connected to a device that monitors your heart rate and rhythm.  You will lie down on a table for an ultrasound exam. A gel will be applied to your chest to help sound waves pass through your skin.  A  handheld device, called a transducer, will be pressed against your chest and moved over your heart. The transducer produces sound waves that travel to your heart and bounce back (or "echo" back) to the transducer. These sound waves will be captured in real-time and changed into images of your heart that can be viewed on a video monitor. The images will be recorded on a computer and reviewed by your health care provider.  You may be asked to change positions or hold your breath for a short time. This makes it easier to get different views or better views of your heart.  In some cases, you may receive contrast through an IV in one of your veins. This can improve the quality of the pictures from your heart. The procedure may vary among health care providers and hospitals.   What can I expect after the test? You may return to your normal, everyday life, including diet, activities, and medicines, unless your health care provider tells you not to do that. Follow these instructions at home:  It is up to you to get the results of your test. Ask your health care provider, or the department that is doing the test, when your results will be ready.  Keep all follow-up visits. This is important. Summary  An echocardiogram is a test that uses sound waves (ultrasound) to produce images of the heart.  Images from an echocardiogram can provide important information about the size and shape of your heart, heart muscle function, heart valve function, and other possible heart problems.  You do not need to do anything to prepare before this test. You may eat and drink normally.  After the echocardiogram is completed, you may return to your normal, everyday life, unless your health care provider tells you not to do that. This information is not intended to replace advice given to you by your health care provider. Make sure you discuss any questions you have with your health care provider. Document Revised:  10/21/2019 Document Reviewed: 10/21/2019 Elsevier Patient Education  2021 [employer]   

## 2020-06-08 NOTE — Progress Notes (Signed)
Cardiology Office Note:    Date:  06/08/2020   ID:  Heather Day, DOB 1985/12/14, MRN [id number]  PCP:  Patient, No Pcp Per (Inactive)  Cardiologist:  [name], MD    Referring MD: Lucretia Roers, MD   No chief complaint on file. I have a lot of belly aches and I need surgery  History of Present Illness:    Heather Day is a 35 y.o. 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  Also morbid obesity.  She came to [place] because she does have abdominal hernia and she needs surgery for it.  Although she is doing well she said she got difficulty moving around more than around because of her morbid obesity.  There is no chest pain tightness squeezing pressure burning chest.  She did not have coronary artery disease.  There is no palpitations no dizziness there is some swelling of lower extremity her evening time but much better than before.  Past Medical History:  Diagnosis Date  . Anxiety    panic attacks  . Bipolar disorder (HCC)   . Drug abuse, IV (HCC)   . Dyspnea    when walking up stairs  . Gallstone   . GERD (gastroesophageal reflux disease)   . Heartburn   . Hepatitis C   . Periodontitis chronic, apical    dental caries  . Pre-diabetes   . PTSD (post-traumatic stress disorder)   . 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 Severe tricuspid regurgitation   . Wears glasses   . Wound infection after surgery    Sternal wound    Past Surgical History:  Procedure Laterality Date  . APPENDECTOMY    . APPLICATION OF WOUND VAC N/A 05/16/2018   Procedure: APPLICATION OF WOUND VAC;  Surgeon: Purcell Nails, MD;  Location: [facility] OR;  Service: Thoracic;   Laterality: N/A;  . CARDIAC SURGERY    . CESAREAN SECTION    . CHOLECYSTECTOMY N/A 01/15/2018   Procedure: LAPAROSCOPIC CHOLECYSTECTOMY ERAS PATHWAY;  Surgeon: Glenna Fellows, MD;  Location: [facility] OR;  Service: General;  Laterality: N/A;  . INTRAOPERATIVE CHOLANGIOGRAM N/A 01/15/2018   Procedure: INTRAOPERATIVE CHOLANGIOGRAM;  Surgeon: Glenna Fellows, MD;  Location: [facility] OR;  Service: General;  Laterality: N/A;  . LAPAROSCOPIC APPENDECTOMY N/A 07/04/2017   Procedure: APPENDECTOMY LAPAROSCOPIC;  Surgeon: Luretha Murphy, MD;  Location: [facility] ORS;  Service: General;  Laterality: N/A;  . MULTIPLE EXTRACTIONS WITH ALVEOLOPLASTY Bilateral 11/29/2017   Procedure: Extraction of tooth #'s 2,5-15, and 31 with alveoloplasty and gross debridement of remaining dentition;  Surgeon: Charlynne Pander, DDS;  Location: [facility] OR;  Service: Oral Surgery;  Laterality: Bilateral;  . STERNAL WOUND DEBRIDEMENT N/A 05/16/2018   Procedure: STERNAL WOUND DEBRIDEMENT with Sternal wire removal x2.;  Surgeon: Purcell Nails, MD;  Location: [facility] OR;  Service: Thoracic;  Laterality: N/A;  . TEE WITHOUT CARDIOVERSION N/A 10/03/2017   Procedure: TRANSESOPHAGEAL ECHOCARDIOGRAM (TEE);  Surgeon: Jodelle Red, MD;  Location: [facility] ENDOSCOPY;  Service: Cardiovascular;  Laterality: N/A;  . TEE WITHOUT CARDIOVERSION N/A 04/18/2018   Procedure: TRANSESOPHAGEAL ECHOCARDIOGRAM (TEE);  Surgeon: Purcell Nails, MD;  Location: [facility] OR;  Service: Open Heart Surgery;  Laterality: N/A;  . TRICUSPID VALVE REPLACEMENT  N/A 04/18/2018   Procedure: TRICUSPID VALVE REPAIR;  Surgeon: Purcell Nails, MD;  Location: [facility] OR;  Service: Open Heart Surgery;  Laterality: N/A;    Current Medications: Current Meds  Medication Sig  . furosemide (LASIX) 40 MG tablet Take 1 tablet by mouth once daily (Patient taking differently: Take 40 mg by mouth daily.)  . methadone (DOLOPHINE) 10 MG/5ML solution Take 130 mg by mouth daily. Patient uncertain of mg  .  omeprazole (PRILOSEC) 20 MG capsule TAKE 1 CAPSULE BY MOUTH TWICE DAILY BEFORE MEAL(S) (Patient taking differently: Take 20 mg by mouth daily.)  . potassium chloride (KLOR-CON) 10 MEQ tablet Take 1 tablet by mouth once daily (Patient taking differently: Take 10 mEq by mouth daily.)     Allergies:   Patient has no known allergies.   Social History   Socioeconomic History  . Marital status: Single    Spouse name: Not on file  . Number of children: Not on file  . Years of education: Not on file  . Highest education level: Not on file  Occupational History  . Not on file  Tobacco Use  . Smoking status: Former Smoker    Packs/day: 1.00    Years: 20.00    Pack years: 20.00    Types: Cigarettes    Quit date: 04/26/2017    Years since quitting: 3.1  . Smokeless tobacco: Never Used  Vaping Use  . Vaping Use: Never used  Substance and Sexual Activity  . Alcohol use: No  . Drug use: Not Currently    Types: IV, Heroin    Comment: clean since 10/09/16  . Sexual activity: Not on file  Other Topics Concern  . Not on file  Social History Narrative  . Not on file   Social Determinants of Health   Financial Resource Strain: Not on file  Food Insecurity: Not on file  Transportation Needs: Not on file  Physical Activity: Not on file  Stress: Not on file  Social Connections: Not on file     Family History: The patient's family history includes Healthy in her father; Hypotension in her mother; Hypothyroidism in her mother. ROS:   Please see the history of present illness.    All 14 point review of systems negative except as described per history of present illness  EKGs/Labs/Other Studies Reviewed:      Recent Labs: 01/28/2020: TSH 3.990 02/22/2020: ALT 18; BUN 13; Creatinine, Ser 0.82; Hemoglobin 12.4; Platelets 208; Potassium 4.0; Sodium 136  Recent Lipid Panel    Component Value Date/Time   CHOL 182 01/28/2020 1504   TRIG 342 (H) 01/28/2020 1504   HDL 35 (L) 01/28/2020  1504   CHOLHDL 5.2 (H) 01/28/2020 1504   LDLCALC 90 01/28/2020 1504    Physical Exam:    VS:  BP (!) 102/50 (BP Location: Right Arm, Patient Position: Sitting)   Pulse 76   Ht 5\' 8"  (1.727 m)   Wt (!) 393 lb (178.3 kg)   SpO2 96%   BMI 59.76 kg/m     Wt Readings from Last 3 Encounters:  06/08/20 (!) 393 lb (178.3 kg)  03/11/20 (!) 373 lb (169.2 kg)  02/22/20 (!) 350 lb (158.8 kg)     GEN:  Well nourished, well developed in no acute distress HEENT: Normal NECK: No JVD; No carotid bruits LYMPHATICS: No lymphadenopathy CARDIAC: RRR, no murmurs, no rubs, no gallops RESPIRATORY:  Clear to auscultation without rales, wheezing or rhonchi  ABDOMEN: Soft, non-tender, non-distended MUSCULOSKELETAL:  No edema; No deformity  SKIN: Warm and dry LOWER EXTREMITIES: no swelling NEUROLOGIC:  Alert and oriented x 3 PSYCHIATRIC:  Normal affect   ASSESSMENT:    1. S/P tricuspid valve repair   2. Morbid obesity (HCC)   3. History of drug abuse (HCC)   4. Incisional hernia, without obstruction or gangrene    PLAN:    In order of problems listed above:  1. Status post tricuspid valve repair.  I will schedule her to have echocardiogram to assess the valve. 2. Morbid obesity serious problem and she is getting worse.  I think she is somebody who can benefit from weight reduction surgery.  Says she required hernia surgery is it possible to do the surgery at the same time.  Big problem is the fact that she does have any insurance.  And I recommended to contact lacelike Kendell Bane did probably have some charity [place] that may be opportunity to do surgery like that over there.  We will try to contact them and get some more information. 3. History of drug abuse she remain clean, I congratulated her encouraged her to stay away from drugs. 4. Incisional hernia.  Again problem required surgery.   Medication Adjustments/Labs and Tests Ordered: Current medicines are reviewed at length with the  patient today.  Concerns regarding medicines are outlined above.  No orders of the defined types were placed in this encounter.  Medication changes: No orders of the defined types were placed in this encounter.   Signed, [name], MD, [facility] 06/08/2020 3:20 PM    [facility]

## 2020-07-05 ENCOUNTER — Other Ambulatory Visit: Payer: Self-pay

## 2020-07-05 ENCOUNTER — Ambulatory Visit (INDEPENDENT_AMBULATORY_CARE_PROVIDER_SITE_OTHER): Payer: Self-pay

## 2020-07-05 DIAGNOSIS — Z9889 Other specified postprocedural states: Secondary | ICD-10-CM

## 2020-07-05 LAB — ECHOCARDIOGRAM COMPLETE
Area-P 1/2: 4.21 cm2
Calc EF: 59.3 %
S' Lateral: 3 cm
Single Plane A2C EF: 53.5 %
Single Plane A4C EF: 63.8 %

## 2020-07-05 NOTE — Progress Notes (Signed)
Complete echocardiogram performed.  Jimmy Nahzir Pohle RDCS, RVT  

## 2020-07-08 ENCOUNTER — Telehealth: Payer: Self-pay

## 2020-07-08 NOTE — Telephone Encounter (Signed)
-----   Message from [name], MD sent at 07/07/2020  5:11 PM EDT ----- PostEchocardiogram showed normal left ventricle ejection fraction, tricuspid valve repair, mild tricuspid valve stenosis overall looks good

## 2020-07-08 NOTE — Telephone Encounter (Signed)
Patient notified of test results 

## 2020-07-14 ENCOUNTER — Telehealth: Payer: Self-pay | Admitting: Family Medicine

## 2020-07-14 NOTE — Telephone Encounter (Signed)
-----   Message from Lucretia Roers, MD sent at 07/09/2020  1:28 PM EDT ----- Lets see her back bc it is been 4 months ----- Message ----- From: Ricard Dillon, RMA Sent: 07/09/2020  12:30 PM EDT To: Lucretia Roers, MD  Pt called to let us know that she had her Echo done and wants to proceed with surgery. Does she need to come back into office?

## 2020-07-14 NOTE — Telephone Encounter (Signed)
I called pt on 07/13/2020 to inform patient of need for appointment. She had questions of why she needed to come into the office for an office visit and I explained that it has to be within 30 days of surgery so that Dr. Henreitta Leber can explain the surgery and any complications that may arise post-op. I informed patient that it had been 03/12/2020 since her last office visit and that is why we needed to see her. Phone call ended with good-byes. Patient called back sounding very distraught that she was having to come back into the office that she had been in to see [place] in February or March. At this point her voice was escalating higher, seemingly agitated and upset; she started saying that I was mean to her and that I did not care about her or her health. I tried to explain that all I could tell her was what was in Epic and that she had her CT done in January but has not been back to this office since December 2021. I tried to explain that I was being as nice as possible but I could not see where she was here in those months and she continued to say that I was mean and didn't care but then she would say not you in particular but your office in general that we had dropped the ball on her care. I told her that I was not there for her to bash on the phone that I was trying to help her and she stated that this conservation is over and I replied perfect and hung up. Lenna Sciara was a witness to this conservation.   In documentation she was seen here by Dr. Henreitta Leber on 03/12/2020 as a new patient. Her CT was scheduled and done on 03/25/2020. On 04/05/2020 she was informed of her results and the need to cardiology apt for risk stratification. She stated that she would call and set up apt. With her cardiologist. She called back on 05/25/2020 and would like for [place] to set up the apt with cards. I placed a referral at that time and she was seen on 06/08/2020 by Dr. Bing Matter. On 07/08/2020 she was notified by their office that she was  cleared for surgery. She called [place] and I sent a note to Dr. Henreitta Leber - see below note.

## 2020-07-20 ENCOUNTER — Ambulatory Visit: Payer: Self-pay | Admitting: General Surgery

## 2020-07-27 ENCOUNTER — Ambulatory Visit (INDEPENDENT_AMBULATORY_CARE_PROVIDER_SITE_OTHER): Payer: Self-pay | Admitting: Family Medicine

## 2020-07-27 ENCOUNTER — Encounter (INDEPENDENT_AMBULATORY_CARE_PROVIDER_SITE_OTHER): Payer: Self-pay | Admitting: Family Medicine

## 2020-07-27 VITALS — BP 118/80 | HR 72 | Temp 98.3°F | Ht 67.0 in | Wt 388.0 lb

## 2020-07-27 DIAGNOSIS — Z1331 Encounter for screening for depression: Secondary | ICD-10-CM

## 2020-07-27 DIAGNOSIS — R0600 Dyspnea, unspecified: Secondary | ICD-10-CM

## 2020-07-27 DIAGNOSIS — E559 Vitamin D deficiency, unspecified: Secondary | ICD-10-CM

## 2020-07-27 DIAGNOSIS — Z6841 Body Mass Index (BMI) 40.0 and over, adult: Secondary | ICD-10-CM

## 2020-07-27 DIAGNOSIS — R0602 Shortness of breath: Secondary | ICD-10-CM

## 2020-07-27 DIAGNOSIS — Z0289 Encounter for other administrative examinations: Secondary | ICD-10-CM

## 2020-07-27 DIAGNOSIS — E782 Mixed hyperlipidemia: Secondary | ICD-10-CM

## 2020-07-27 DIAGNOSIS — R7303 Prediabetes: Secondary | ICD-10-CM

## 2020-07-27 DIAGNOSIS — R5383 Other fatigue: Secondary | ICD-10-CM

## 2020-07-27 DIAGNOSIS — Z9889 Other specified postprocedural states: Secondary | ICD-10-CM

## 2020-07-28 DIAGNOSIS — E782 Mixed hyperlipidemia: Secondary | ICD-10-CM | POA: Insufficient documentation

## 2020-07-28 DIAGNOSIS — E559 Vitamin D deficiency, unspecified: Secondary | ICD-10-CM | POA: Insufficient documentation

## 2020-07-28 DIAGNOSIS — R0602 Shortness of breath: Secondary | ICD-10-CM | POA: Insufficient documentation

## 2020-07-28 DIAGNOSIS — R5383 Other fatigue: Secondary | ICD-10-CM | POA: Insufficient documentation

## 2020-07-28 DIAGNOSIS — R7303 Prediabetes: Secondary | ICD-10-CM | POA: Insufficient documentation

## 2020-07-28 LAB — COMPREHENSIVE METABOLIC PANEL
ALT: 12 IU/L (ref 0–32)
AST: 18 IU/L (ref 0–40)
Albumin/Globulin Ratio: 1.6 (ref 1.2–2.2)
Albumin: 4.2 g/dL (ref 3.8–4.8)
Alkaline Phosphatase: 66 IU/L (ref 44–121)
BUN/Creatinine Ratio: 16 (ref 9–23)
BUN: 13 mg/dL (ref 6–20)
Bilirubin Total: 0.3 mg/dL (ref 0.0–1.2)
CO2: 22 mmol/L (ref 20–29)
Calcium: 9.8 mg/dL (ref 8.7–10.2)
Chloride: 99 mmol/L (ref 96–106)
Creatinine, Ser: 0.81 mg/dL (ref 0.57–1.00)
Globulin, Total: 2.7 g/dL (ref 1.5–4.5)
Glucose: 95 mg/dL (ref 65–99)
Potassium: 4.7 mmol/L (ref 3.5–5.2)
Sodium: 137 mmol/L (ref 134–144)
Total Protein: 6.9 g/dL (ref 6.0–8.5)
eGFR: 96 mL/min/{1.73_m2} (ref >59)

## 2020-07-28 LAB — CBC WITH DIFFERENTIAL
Basophils Absolute: 0 10*3/uL (ref 0.0–0.2)
Basos: 1 %
EOS (ABSOLUTE): 0.2 10*3/uL (ref 0.0–0.4)
Eos: 3 %
Hematocrit: 40.6 % (ref 34.0–46.6)
Hemoglobin: 13.1 g/dL (ref 11.1–15.9)
Immature Grans (Abs): 0 10*3/uL (ref 0.0–0.1)
Immature Granulocytes: 0 %
Lymphocytes Absolute: 1.7 10*3/uL (ref 0.7–3.1)
Lymphs: 31 %
MCH: 29.4 pg (ref 26.6–33.0)
MCHC: 32.3 g/dL (ref 31.5–35.7)
MCV: 91 fL (ref 79–97)
Monocytes Absolute: 0.4 10*3/uL (ref 0.1–0.9)
Monocytes: 7 %
Neutrophils Absolute: 3.2 10*3/uL (ref 1.4–7.0)
Neutrophils: 58 %
RBC: 4.46 x10E6/uL (ref 3.77–5.28)
RDW: 13.2 % (ref 11.7–15.4)
WBC: 5.6 10*3/uL (ref 3.4–10.8)

## 2020-07-28 LAB — TSH: TSH: 6.41 u[IU]/mL — ABNORMAL HIGH (ref 0.450–4.500)

## 2020-07-28 LAB — LIPID PANEL WITH LDL/HDL RATIO
Cholesterol, Total: 162 mg/dL (ref 100–199)
HDL: 38 mg/dL — ABNORMAL LOW (ref >39)
LDL Chol Calc (NIH): 93 mg/dL (ref 0–99)
LDL/HDL Ratio: 2.4 ratio (ref 0.0–3.2)
Triglycerides: 182 mg/dL — ABNORMAL HIGH (ref 0–149)
VLDL Cholesterol Cal: 31 mg/dL (ref 5–40)

## 2020-07-28 LAB — INSULIN, RANDOM: INSULIN: 15.8 u[IU]/mL (ref 2.6–24.9)

## 2020-07-28 LAB — VITAMIN D 25 HYDROXY (VIT D DEFICIENCY, FRACTURES): Vit D, 25-Hydroxy: 26.3 ng/mL — ABNORMAL LOW (ref 30.0–100.0)

## 2020-07-28 LAB — VITAMIN B12: Vitamin B-12: 410 pg/mL (ref 232–1245)

## 2020-07-28 LAB — FOLATE: Folate: 11.9 ng/mL (ref >3.0)

## 2020-07-28 LAB — HEMOGLOBIN A1C
Est. average glucose Bld gHb Est-mCnc: 131 mg/dL
Hgb A1c MFr Bld: 6.2 % — ABNORMAL HIGH (ref 4.8–5.6)

## 2020-07-28 LAB — T3: T3, Total: 162 ng/dL (ref 71–180)

## 2020-07-28 LAB — T4: T4, Total: 8.1 ug/dL (ref 4.5–12.0)

## 2020-07-28 NOTE — Progress Notes (Signed)
Chief Complaint:   OBESITY Heather Day (MR# [id number]) is a 35 y.o. female who presents for evaluation and treatment of obesity and related comorbidities. Current BMI is Body mass index is 60.77 kg/m. Heather Day has been struggling with her weight for many years and has been unsuccessful in either losing weight, maintaining weight loss, or reaching her healthy weight goal.  Heather Day is currently in the action stage of change and ready to dedicate time achieving and maintaining a healthier weight. Heather Day is interested in becoming our patient and working on intensive lifestyle modifications including (but not limited to) diet and exercise for weight loss.  Heather Day's habits were reviewed today and are as follows: Her family eats meals together, she thinks her family will eat healthier with her, her desired weight loss is 168 lbs, she has been heavy most of her life, she started gaining weight (always been bigger), her heaviest weight ever was 392 pounds, she has significant food cravings issues, she snacks frequently in the evenings, she wakes up frequently in the middle of the night to eat, she skips meals frequently, she frequently makes poor food choices, she frequently eats larger portions than normal, she has binge eating behaviors and she struggles with emotional eating.  Depression Screen Heather Day's Food and Mood (modified PHQ-9) score was 18.  Depression screen [facility] 2/9 07/27/2020  Decreased Interest 3  Down, Depressed, Hopeless 3  PHQ - 2 Score 6  Altered sleeping 0  Tired, decreased energy 3  Change in appetite 3  Feeling bad or failure about yourself  1  Trouble concentrating 3  Moving slowly or fidgety/restless 2  Suicidal thoughts 0  PHQ-9 Score 18   Subjective:   1. Other fatigue [name] admits to daytime somnolence and admits to waking up still tired. Patent has a history of symptoms of daytime fatigue. Heather Day generally gets 7 hours of sleep per night, and states that she has  nightime awakenings. Snoring is present. Apneic episodes are not present. Epworth Sleepiness Score is 18.  2. Shortness of breath with exertion Heather Day notes increasing shortness of breath with exercising and seems to be worsening over time with weight gain. She notes getting out of breath sooner with activity than she used to. This has not gotten worse recently. Heather Day denies shortness of breath at rest or orthopnea.  3. Pre-diabetes Heather Day has a history of pre-diabetes and she notes significant polyphagia.  4. 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  5. Mixed hyperlipidemia Heather Day is working on diet and weight loss. She is not on statin.  6. Vitamin D deficiency Heather Day is at high risk of Vit D deficiency, and she notes fatigue.  Assessment/Plan:   1. Other fatigue Heather Day does feel that her weight is causing her energy to be lower than it should be. Fatigue may be related to obesity, depression or many other causes. Labs will be ordered, and in the meanwhile, [name] will focus on self care including making healthy food choices, increasing physical activity and focusing on stress reduction.  - EKG 12-Lead - Comprehensive metabolic panel - Vitamin B12 - TSH - Folate - T3 - T4 - CBC With Differential  2. Shortness of breath with exertion Heather Day does feel that she gets out of breath more easily that she used to when she exercises. Heather Day's shortness of breath appears to be obesity related and exercise  induced. She has agreed to work on weight loss and gradually increase exercise to treat her exercise induced shortness of breath. Will continue to monitor closely.  3. Pre-diabetes Allie will start her Category 3 plan, and will continue to work on weight loss, exercise, and decreasing simple carbohydrates to help decrease the risk of diabetes. We will check labs today.  - Insulin,  random - Hemoglobin A1c  4. S/P tricuspid valve repair Heather Day will continue to follow up with Cardiology, and will not add any QT prolonging medications such as phentermine.  5. Mixed hyperlipidemia Cardiovascular risk and specific lipid/LDL goals reviewed. We discussed several lifestyle modifications today. We will check labs today. [name] will start her Category 3 plan, and will continue to work on diet, exercise and weight loss efforts. Orders and follow up as documented in patient record.   - Lipid Panel With LDL/HDL Ratio  6. Vitamin D deficiency Low Vitamin D level contributes to fatigue and are associated with obesity, breast, and colon cancer. We will check labs today. [name] will follow-up for routine testing of Vitamin D, at least 2-3 times per year to avoid over-replacement.  - VITAMIN D 25 Hydroxy (Vit-D Deficiency, Fractures)  7. Screening for depression Heather Day had a positive depression screening. Depression is commonly associated with obesity and often results in emotional eating behaviors. We will monitor this closely and work on CBT to help improve the non-hunger eating patterns. Referral to Psychology may be required if no improvement is seen as she continues in our clinic.  8. Obesity with current BMI 60.9 Heather Day is currently in the action stage of change and her goal is to continue with weight loss efforts. I recommend [name] begin the structured treatment plan as follows:  She has agreed to the Category 3 Plan.  Exercise goals: No exercise has been prescribed for now, while we concentrate on nutritional changes.  Behavioral modification strategies: increasing lean protein intake and meal planning and cooking strategies.  She was informed of the importance of frequent follow-up visits to maximize her success with intensive lifestyle modifications for her multiple health conditions. She was informed we would discuss her lab results at her next visit unless there is a critical  issue that needs to be addressed sooner. Heather Day agreed to keep her next visit at the agreed upon time to discuss these results.  Objective:   Blood pressure 118/80, pulse 72, temperature 98.3 F (36.8 C), height 5\' 7"  (1.702 m), weight (!) 388 lb (176 kg), SpO2 98 %. Body mass index is 60.77 kg/m.  EKG: Normal sinus rhythm, rate 67 BPM.  Indirect Calorimeter completed today shows a VO2 of 452 and a REE of 3149.  Her calculated basal metabolic rate is thus her basal metabolic rate is better than expected.  General: Cooperative, alert, well developed, in no acute distress. HEENT: Conjunctivae and lids unremarkable. Cardiovascular: Regular rhythm.  Lungs: Normal work of breathing. Neurologic: No focal deficits.   Lab Results  Component Value Date   CREATININE 0.81 07/27/2020   BUN 13 07/27/2020   NA 137 07/27/2020   K 4.7 07/27/2020   CL 99 07/27/2020   CO2 22 07/27/2020   Lab Results  Component Value Date   ALT 12 07/27/2020   AST 18 07/27/2020   ALKPHOS 66 07/27/2020   BILITOT 0.3 07/27/2020   Lab Results  Component Value Date   HGBA1C 6.2 (H) 07/27/2020   HGBA1C 5.8 (H) 04/15/2018   Lab Results  Component Value Date  INSULIN 15.8 07/27/2020   Lab Results  Component Value Date   TSH 6.410 (H) 07/27/2020   Lab Results  Component Value Date   CHOL 162 07/27/2020   HDL 38 (L) 07/27/2020   LDLCALC 93 07/27/2020   TRIG 182 (H) 07/27/2020   CHOLHDL 5.2 (H) 01/28/2020   Lab Results  Component Value Date   WBC 5.6 07/27/2020   HGB 13.1 07/27/2020   HCT 40.6 07/27/2020   MCV 91 07/27/2020   PLT 208 02/22/2020   No results found for: IRON, TIBC, FERRITIN  Attestation Statements:   Reviewed by clinician on day of visit: allergies, medications, problem list, medical history, surgical history, family history, social history, and previous encounter notes.  Time spent on visit including pre-visit chart review and post-visit charting and care was 60 minutes.     I, Burt Knack, am acting as transcriptionist for Quillian Quince, MD.  I have reviewed the above documentation for accuracy and completeness, and I agree with the above. - Quillian Quince, MD

## 2020-08-10 ENCOUNTER — Other Ambulatory Visit: Payer: Self-pay

## 2020-08-10 ENCOUNTER — Ambulatory Visit (INDEPENDENT_AMBULATORY_CARE_PROVIDER_SITE_OTHER): Payer: Self-pay | Admitting: Family Medicine

## 2020-08-10 ENCOUNTER — Encounter (INDEPENDENT_AMBULATORY_CARE_PROVIDER_SITE_OTHER): Payer: Self-pay | Admitting: Family Medicine

## 2020-08-10 VITALS — BP 114/78 | HR 80 | Temp 97.8°F | Ht 67.0 in | Wt 382.0 lb

## 2020-08-10 DIAGNOSIS — Z6841 Body Mass Index (BMI) 40.0 and over, adult: Secondary | ICD-10-CM

## 2020-08-10 DIAGNOSIS — R7303 Prediabetes: Secondary | ICD-10-CM

## 2020-08-10 DIAGNOSIS — E559 Vitamin D deficiency, unspecified: Secondary | ICD-10-CM

## 2020-08-10 DIAGNOSIS — E782 Mixed hyperlipidemia: Secondary | ICD-10-CM

## 2020-08-10 MED ORDER — VITAMIN D (ERGOCALCIFEROL) 1.25 MG (50000 UNIT) PO CAPS: 50000.0000 [IU] | ORAL_CAPSULE | ORAL | 0 refills | Status: DC

## 2020-08-10 MED ORDER — METFORMIN HCL 500 MG PO TABS: 500.0000 mg | ORAL_TABLET | Freq: Every day | ORAL | 0 refills | Status: DC

## 2020-08-11 NOTE — Progress Notes (Signed)
Chief Complaint:   OBESITY Meili is here to discuss her progress with her obesity treatment plan along with follow-up of her obesity related diagnoses. Meline is on the Category 3 Plan and states she is following her eating plan approximately 98% of the time. Zadie states she is doing 0 minutes 0 times per week.  Today's visit was #: 2 Starting weight: 388 lbs Starting date: 07/27/2020 Today's weight: 382 lbs Today's date: 08/10/2020 Total lbs lost to date: 6 Total lbs lost since last in-office visit: 6  Interim History: Genevive has done well with weight loss on her Category 3 plan. She did some celebration eating over [place] Day, but overall she did well eating all of her food.  Subjective:   1. Mixed hyperlipidemia Afreen has a new diagnosis of hyperlipidemia. Her triglycerides are elevated and HDL is low, but her LDL is within normal limits. She is working on diet, exercise, and weight loss, and she denies chest pain. I discussed labs with the patient today.  2. Vitamin D deficiency Annaya is not on Vit D, and he level is very low. She notes fatigue. I discussed labs with the patient today.  3. Pre-diabetes Evonna has a new diagnosis of pre-diabetes. She has a strong family history of diabetes mellitus. Her A1c is elevated at 6.2, and she notes polyphagia. She has done well with her Category 3 plan. I discussed labs with the patient today.  Assessment/Plan:   1. Mixed hyperlipidemia Cardiovascular risk and specific lipid/LDL goals reviewed. We discussed several lifestyle modifications today. Lucile will continue to work on diet, exercise and weight loss efforts. We will recheck labs in 3 weeks. Orders and follow up as documented in patient record.   2. Vitamin D deficiency Low Vitamin D level contributes to fatigue and are associated with obesity, breast, and colon cancer. Yazlin agreed to start prescription Vitamin D 50,000 IU every week with no refills.She will follow-up for  routine testing of Vitamin D, at least 2-3 times per year to avoid over-replacement.  - Vitamin D, Ergocalciferol, (DRISDOL) 1.25 MG (50000 UNIT) CAPS capsule; Take 1 capsule (50,000 Units total) by mouth every 7 (seven) days.  Dispense: 4 capsule; Refill: 0  3. Pre-diabetes Jazaria agreed to start metformin 500 mg q AM with food with no refills. She will continue to work on weight loss, exercise, and decreasing simple carbohydrates to help decrease the risk of diabetes.   - metFORMIN (GLUCOPHAGE) 500 MG tablet; Take 1 tablet (500 mg total) by mouth daily with breakfast.  Dispense: 30 tablet; Refill: 0  4. Obesity with current BMI 59.9 Deaisha is currently in the action stage of change. As such, her goal is to continue with weight loss efforts. She has agreed to the Category 3 Plan.   Behavioral modification strategies: increasing lean protein intake.  Azaryah has agreed to follow-up with our clinic in 2 to 3 weeks. She was informed of the importance of frequent follow-up visits to maximize her success with intensive lifestyle modifications for her multiple health conditions.   Objective:   Blood pressure 114/78, pulse 80, temperature 97.8 F (36.6 C), height 5\' 7"  (1.702 m), weight (!) 382 lb (173.3 kg), SpO2 97 %. Body mass index is 59.83 kg/m.  General: Cooperative, alert, well developed, in no acute distress. HEENT: Conjunctivae and lids unremarkable. Cardiovascular: Regular rhythm.  Lungs: Normal work of breathing. Neurologic: No focal deficits.   Lab Results  Component Value Date   CREATININE 0.81 07/27/2020  BUN 13 07/27/2020   NA 137 07/27/2020   K 4.7 07/27/2020   CL 99 07/27/2020   CO2 22 07/27/2020   Lab Results  Component Value Date   ALT 12 07/27/2020   AST 18 07/27/2020   ALKPHOS 66 07/27/2020   BILITOT 0.3 07/27/2020   Lab Results  Component Value Date   HGBA1C 6.2 (H) 07/27/2020   HGBA1C 5.8 (H) 04/15/2018   Lab Results  Component Value Date   INSULIN  15.8 07/27/2020   Lab Results  Component Value Date   TSH 6.410 (H) 07/27/2020   Lab Results  Component Value Date   CHOL 162 07/27/2020   HDL 38 (L) 07/27/2020   LDLCALC 93 07/27/2020   TRIG 182 (H) 07/27/2020   CHOLHDL 5.2 (H) 01/28/2020   Lab Results  Component Value Date   WBC 5.6 07/27/2020   HGB 13.1 07/27/2020   HCT 40.6 07/27/2020   MCV 91 07/27/2020   PLT 208 02/22/2020   No results found for: IRON, TIBC, FERRITIN  Attestation Statements:   Reviewed by clinician on day of visit: allergies, medications, problem list, medical history, surgical history, family history, social history, and previous encounter notes.  Time spent on visit including pre-visit chart review and post-visit care and charting was 40 minutes.    I, Burt Knack, am acting as transcriptionist for Quillian Quince, MD.  I have reviewed the above documentation for accuracy and completeness, and I agree with the above. -  Quillian Quince, MD

## 2020-09-07 ENCOUNTER — Encounter (INDEPENDENT_AMBULATORY_CARE_PROVIDER_SITE_OTHER): Payer: Self-pay

## 2020-09-07 ENCOUNTER — Ambulatory Visit (INDEPENDENT_AMBULATORY_CARE_PROVIDER_SITE_OTHER): Payer: Self-pay | Admitting: Family Medicine

## 2020-09-07 ENCOUNTER — Encounter (INDEPENDENT_AMBULATORY_CARE_PROVIDER_SITE_OTHER): Payer: Self-pay | Admitting: Family Medicine

## 2020-09-07 ENCOUNTER — Other Ambulatory Visit: Payer: Self-pay

## 2020-09-07 VITALS — BP 134/83 | HR 71 | Temp 98.8°F | Ht 67.0 in | Wt 389.0 lb

## 2020-09-07 DIAGNOSIS — Z6841 Body Mass Index (BMI) 40.0 and over, adult: Secondary | ICD-10-CM

## 2020-09-07 DIAGNOSIS — R7303 Prediabetes: Secondary | ICD-10-CM

## 2020-09-07 DIAGNOSIS — E559 Vitamin D deficiency, unspecified: Secondary | ICD-10-CM

## 2020-09-07 MED ORDER — METFORMIN HCL 500 MG PO TABS
500.0000 mg | ORAL_TABLET | Freq: Every day | ORAL | 0 refills | Status: DC
Start: 2020-09-07 — End: 2020-09-28

## 2020-09-07 MED ORDER — VITAMIN D (ERGOCALCIFEROL) 1.25 MG (50000 UNIT) PO CAPS
50000.0000 [IU] | ORAL_CAPSULE | ORAL | 0 refills | Status: DC
Start: 2020-09-07 — End: 2020-09-28

## 2020-09-09 ENCOUNTER — Encounter (INDEPENDENT_AMBULATORY_CARE_PROVIDER_SITE_OTHER): Payer: Self-pay | Admitting: Family Medicine

## 2020-09-09 NOTE — Progress Notes (Addendum)
Chief Complaint:   OBESITY Heather Day is here to discuss her progress with her obesity treatment plan along with follow-up of her obesity related diagnoses. [name] is on the Category 3 Plan and states she is following her eating plan approximately 90% of the time. Khalise states she is not currently exercising.  Today's visit was #: 3 Starting weight: 388 lbs Starting date: 07/27/2020 Today's weight: 389 lbs Today's date: 09/07/2020 Total lbs lost to date: 0 Total lbs lost since last in-office visit: +7  Interim History: Heather Day admits she has not had much money for food due to buying a new car. It has made it difficult for her to stick to the plan. She has applied for SNAP benefits in the past but did not qualify.  She notes she does crave sweets due to taking methadone. Hunger is mostly satisfied.  Subjective:   1. Pre-diabetes [name]'s last A1c was 6.2. She is on Metformin daily and tolerating it well. She denies polyphagia.  Lab Results  Component Value Date   HGBA1C 6.2 (H) 07/27/2020   Lab Results  Component Value Date   INSULIN 15.8 07/27/2020   2. Vitamin D deficiency She is currently taking prescription vitamin D 50,000 IU each week. Her Vit D is low at 26.3.  Lab Results  Component Value Date   VD25OH 26.3 (L) 07/27/2020   Assessment/Plan:   1. Pre-diabetes We will refill Metformin 500 mg for 1 month supply.  - metFORMIN (GLUCOPHAGE) 500 MG tablet; Take 1 tablet (500 mg total) by mouth daily with breakfast.  Dispense: 30 tablet; Refill: 0  2. Vitamin D deficiency We will refill Vit D 50,000 IU for 1 month supply.  - Vitamin D, Ergocalciferol, (DRISDOL) 1.25 MG (50000 UNIT) CAPS capsule; Take 1 capsule (50,000 Units total) by mouth every 7 (seven) days.  Dispense: 4 capsule; Refill: 0  3. Obesity: Current BMI 60.91  Heather Day is currently in the action stage of change. As such, her goal is to continue with weight loss efforts. She has agreed to the Category 3 Plan  and keeping a food journal and adhering to recommended goals of 450-650 calories and 40 g protein with supper.   Handouts: Recipes and Food Assistance  Exercise goals: No exercise has been prescribed at this time.  Behavioral modification strategies: meal planning and cooking strategies and better snacking choices.  [name] has agreed to follow-up with our clinic in 3 weeks.  Objective:   Blood pressure 134/83, pulse 71, temperature 98.8 F (37.1 C), height 5\' 7"  (1.702 m), weight (!) 389 lb (176.4 kg), SpO2 98 %. Body mass index is 60.93 kg/m.  General: Cooperative, alert, well developed, in no acute distress. HEENT: Conjunctivae and lids unremarkable. Cardiovascular: Regular rhythm.  Lungs: Normal work of breathing. Neurologic: No focal deficits.   Lab Results  Component Value Date   CREATININE 0.81 07/27/2020   BUN 13 07/27/2020   NA 137 07/27/2020   K 4.7 07/27/2020   CL 99 07/27/2020   CO2 22 07/27/2020   Lab Results  Component Value Date   ALT 12 07/27/2020   AST 18 07/27/2020   ALKPHOS 66 07/27/2020   BILITOT 0.3 07/27/2020   Lab Results  Component Value Date   HGBA1C 6.2 (H) 07/27/2020   HGBA1C 5.8 (H) 04/15/2018   Lab Results  Component Value Date   INSULIN 15.8 07/27/2020   Lab Results  Component Value Date   TSH 6.410 (H) 07/27/2020   Lab Results  Component  Value Date   CHOL 162 07/27/2020   HDL 38 (L) 07/27/2020   LDLCALC 93 07/27/2020   TRIG 182 (H) 07/27/2020   CHOLHDL 5.2 (H) 01/28/2020   Lab Results  Component Value Date   VD25OH 26.3 (L) 07/27/2020   Lab Results  Component Value Date   WBC 5.6 07/27/2020   HGB 13.1 07/27/2020   HCT 40.6 07/27/2020   MCV 91 07/27/2020   PLT 208 02/22/2020   No results found for: IRON, TIBC, FERRITIN  Attestation Statements:   Reviewed by clinician on day of visit: allergies, medications, problem list, medical history, surgical history, family history, social history, and previous encounter  notes.  Edmund Hilda, CMA, am acting as Energy manager for [place], FNP.  I have reviewed the above documentation for accuracy and completeness, and I agree with the above. -  Jesse Sans, FNP

## 2020-09-28 ENCOUNTER — Other Ambulatory Visit: Payer: Self-pay

## 2020-09-28 ENCOUNTER — Encounter (INDEPENDENT_AMBULATORY_CARE_PROVIDER_SITE_OTHER): Payer: Self-pay | Admitting: Family Medicine

## 2020-09-28 ENCOUNTER — Ambulatory Visit (INDEPENDENT_AMBULATORY_CARE_PROVIDER_SITE_OTHER): Payer: Self-pay | Admitting: Family Medicine

## 2020-09-28 VITALS — BP 123/66 | HR 89 | Temp 98.8°F | Ht 67.0 in | Wt 386.0 lb

## 2020-09-28 DIAGNOSIS — E559 Vitamin D deficiency, unspecified: Secondary | ICD-10-CM

## 2020-09-28 DIAGNOSIS — Z6841 Body Mass Index (BMI) 40.0 and over, adult: Secondary | ICD-10-CM

## 2020-09-28 DIAGNOSIS — F3289 Other specified depressive episodes: Secondary | ICD-10-CM

## 2020-09-28 DIAGNOSIS — R7303 Prediabetes: Secondary | ICD-10-CM

## 2020-09-28 MED ORDER — BUPROPION HCL ER (SR) 150 MG PO TB12: 150.0000 mg | ORAL_TABLET | Freq: Every day | ORAL | 0 refills | Status: DC

## 2020-09-28 MED ORDER — METFORMIN HCL 500 MG PO TABS: 500.0000 mg | ORAL_TABLET | Freq: Two times a day (BID) | ORAL | 0 refills | Status: DC

## 2020-09-28 MED ORDER — VITAMIN D (ERGOCALCIFEROL) 1.25 MG (50000 UNIT) PO CAPS: 50000.0000 [IU] | ORAL_CAPSULE | ORAL | 0 refills | Status: DC

## 2020-10-04 NOTE — Progress Notes (Signed)
Chief Complaint:   OBESITY Heather Day is here to discuss her progress with her obesity treatment plan along with follow-up of her obesity related diagnoses. Heather Day is on the Category 3 Plan and keeping a food journal and adhering to recommended goals of 450-650 calories and 40 grams of protein at dinner and states she is following her eating plan approximately 80% of the time. Heather Day states she is doing 0 minutes 0 times per week.  Today's visit was #: 4 Starting weight: 388 lbs Starting date: 07/27/2020 Today's weight: 386 lbs Today's date: 09/28/2020 Total lbs lost to date: 2 lbs Total lbs lost since last in-office visit: 3 lbs  Interim History: Heather Day feels her emotional eating is her largest barrier to sticking to the plan. She feels the Metformin is really helping with her appetite. She notes that her boyfriend is supportive of her efforts.  Subjective:   1. Pre-diabetes On metformin 500 mg QD. She is tolerating it well.  Lab Results  Component Value Date   HGBA1C 6.2 (H) 07/27/2020   Lab Results  Component Value Date   INSULIN 15.8 07/27/2020    2. Vitamin D deficiency Heather Day is taking prescription Vitamin D 50,000 IU each week. Her Vitamin D was low at 26.3.  Lab Results  Component Value Date   VD25OH 26.3 (L) 07/27/2020    3. Other depression with emotional eating Heather Day notes sweet cravings with Methadone. She also feels she eats for emotional reasons. She sees mental health providers at the methadone clinic.   Assessment/Plan:   1. Pre-diabetes Heather Day agrees to increase Metformin 500 mg with meals BID. We will refill Metformin for 1 month .  - metFORMIN (GLUCOPHAGE) 500 MG tablet; Take 1 tablet (500 mg total) by mouth 2 (two) times daily with a meal.  Dispense: 180 tablet; Refill: 0  2. Vitamin D deficiency We will refill Vitamin D for 1 month with no refills. She agrees to continue to take prescription Vitamin D 50,000 IU every week and will follow-up for  routine testing of Vitamin D, at least 2-3 times per year to avoid over-replacement.  - Vitamin D, Ergocalciferol, (DRISDOL) 1.25 MG (50000 UNIT) CAPS capsule; Take 1 capsule (50,000 Units total) by mouth every 7 (seven) days.  Dispense: 12 capsule; Refill: 0  3. Other depression with emotional eating We will refill bupropion 150 mg qAM for 1 month with no refills.- buPROPion (WELLBUTRIN SR) 150 MG 12 hr tablet; Take 1 tablet (150 mg total) by mouth daily with breakfast.  Dispense: 30 tablet; Refill: 0  4. Obesity: Current BMI 60.44 Heather Day is currently in the action stage of change. As such, her goal is to continue with weight loss efforts. She has agreed to the Category 3 Plan and keeping a food journal and adhering to recommended goals of 450-650 calories and 40 protein.   Exercise goals:  None  Behavioral modification strategies: increasing lean protein intake and decreasing simple carbohydrates.  Heather Day has agreed to follow-up with our clinic in 3-4 weeks.   Objective:   Blood pressure 123/66, pulse 89, temperature 98.8 F (37.1 C), height 5\' 7"  (1.702 m), weight (!) 386 lb (175.1 kg), SpO2 96 %. Body mass index is 60.46 kg/m.  General: Cooperative, alert, well developed, in no acute distress. HEENT: Conjunctivae and lids unremarkable. Cardiovascular: Regular rhythm.  Lungs: Normal work of breathing. Neurologic: No focal deficits.   Lab Results  Component Value Date   CREATININE 0.81 07/27/2020   BUN 13 07/27/2020  NA 137 07/27/2020   K 4.7 07/27/2020   CL 99 07/27/2020   CO2 22 07/27/2020   Lab Results  Component Value Date   ALT 12 07/27/2020   AST 18 07/27/2020   ALKPHOS 66 07/27/2020   BILITOT 0.3 07/27/2020   Lab Results  Component Value Date   HGBA1C 6.2 (H) 07/27/2020   HGBA1C 5.8 (H) 04/15/2018   Lab Results  Component Value Date   INSULIN 15.8 07/27/2020   Lab Results  Component Value Date   TSH 6.410 (H) 07/27/2020   Lab Results  Component Value  Date   CHOL 162 07/27/2020   HDL 38 (L) 07/27/2020   LDLCALC 93 07/27/2020   TRIG 182 (H) 07/27/2020   CHOLHDL 5.2 (H) 01/28/2020   Lab Results  Component Value Date   VD25OH 26.3 (L) 07/27/2020   Lab Results  Component Value Date   WBC 5.6 07/27/2020   HGB 13.1 07/27/2020   HCT 40.6 07/27/2020   MCV 91 07/27/2020   PLT 208 02/22/2020   No results found for: IRON, TIBC, FERRITIN  Attestation Statements:   Reviewed by clinician on day of visit: allergies, medications, problem list, medical history, surgical history, family history, social history, and previous encounter notes.  I, Jackson Latino, RMA, am acting as Energy manager for [place], FNP.  I have reviewed the above documentation for accuracy and completeness, and I agree with the above. -  Jesse Sans, FNP

## 2020-10-05 DIAGNOSIS — F32A Depression, unspecified: Secondary | ICD-10-CM | POA: Insufficient documentation

## 2020-10-19 ENCOUNTER — Encounter (INDEPENDENT_AMBULATORY_CARE_PROVIDER_SITE_OTHER): Payer: Self-pay | Admitting: Family Medicine

## 2020-10-19 ENCOUNTER — Ambulatory Visit (INDEPENDENT_AMBULATORY_CARE_PROVIDER_SITE_OTHER): Payer: Self-pay | Admitting: Family Medicine

## 2020-10-19 ENCOUNTER — Other Ambulatory Visit: Payer: Self-pay

## 2020-10-19 VITALS — BP 128/80 | HR 68 | Temp 97.9°F | Ht 67.0 in | Wt 386.0 lb

## 2020-10-19 DIAGNOSIS — R7303 Prediabetes: Secondary | ICD-10-CM

## 2020-10-19 DIAGNOSIS — F3289 Other specified depressive episodes: Secondary | ICD-10-CM

## 2020-10-19 DIAGNOSIS — Z6841 Body Mass Index (BMI) 40.0 and over, adult: Secondary | ICD-10-CM

## 2020-10-19 DIAGNOSIS — E66813 Obesity, class 3: Secondary | ICD-10-CM

## 2020-10-19 DIAGNOSIS — F1120 Opioid dependence, uncomplicated: Secondary | ICD-10-CM

## 2020-10-19 NOTE — Progress Notes (Signed)
Chief Complaint:   OBESITY Heather Day is here to discuss her progress with her obesity treatment plan along with follow-up of her obesity related diagnoses. Heather Day is on the Category 3 Plan and keeping a food journal and adhering to recommended goals of 450-650 calories and 40 grams of  protein and states she is following her eating plan approximately 80% of the time. Heather Day states she is doing 0 minutes 0 times per week.  Today's visit was #: 5 Starting weight: 388 lbs Starting date: 07/27/2020 Today's weight: 286 lbs Today's date: 10/19/2020 Total lbs lost to date: 2 lbs Total lbs lost since last in-office visit: 0  Interim History: Heather Day  has not been eating breakfast due to depression and lack of motivation. She is doing well with other meals. She cut out all sugar sweetened beverages.  Subjective:   1. Pre-diabetes Heather Day stated the increase dose of Metformin affected her sensitivity to Methadone, so she discontinued it.  Lab Results  Component Value Date   HGBA1C 6.2 (H) 07/27/2020   Lab Results  Component Value Date   INSULIN 15.8 07/27/2020    2. Other depression with emotional eating Heather Day has not started the bupropion. She forgot about it.  3. Opioid dependence on agonist therapy (HCC) Heather Day is stable on Methadone daily.  She has a supportive boyfriend who has never had any substance abuse issues himself.  She is currently paying cash for Methadone therapy ($14/day).  Assessment/Plan:   1. Pre-diabetes Heather Day will decrease to daily 500 mg Metformin once. She may discontinue if needed. She will continue to work on weight loss, exercise, and decreasing simple carbohydrates to help decrease the risk of diabetes.    2. Other depression with emotional eating Heather Day agrees to start bupropion today. Behavior modification techniques were discussed today to help Heather Day deal with her emotional/non-hunger eating behaviors.    3. Opioid dependence on agonist therapy (HCC) A  number was provided for [facility] where she may be able to get free Methadone.   4. Obesity: Current BMI 60.44 Heather Day is currently in the action stage of change. As such, her goal is to continue with weight loss efforts. She has agreed to the Category 3 Plan.   Handout: egg muffin recipe.  Exercise goals: No exercise has been prescribed at this time.  Behavioral modification strategies: no skipping meals.  Heather Day has agreed to follow-up with our clinic in 3 weeks.  Objective:   Blood pressure 128/80, pulse 68, temperature 97.9 F (36.6 C), height 5\' 7"  (1.702 m), weight (!) 386 lb (175.1 kg), SpO2 98 %. Body mass index is 60.46 kg/m.  General: Cooperative, alert, well developed, in no acute distress. HEENT: Conjunctivae and lids unremarkable. Cardiovascular: Regular rhythm.  Lungs: Normal work of breathing. Neurologic: No focal deficits.   Lab Results  Component Value Date   CREATININE 0.81 07/27/2020   BUN 13 07/27/2020   NA 137 07/27/2020   K 4.7 07/27/2020   CL 99 07/27/2020   CO2 22 07/27/2020   Lab Results  Component Value Date   ALT 12 07/27/2020   AST 18 07/27/2020   ALKPHOS 66 07/27/2020   BILITOT 0.3 07/27/2020   Lab Results  Component Value Date   HGBA1C 6.2 (H) 07/27/2020   HGBA1C 5.8 (H) 04/15/2018   Lab Results  Component Value Date   INSULIN 15.8 07/27/2020   Lab Results  Component Value Date   TSH 6.410 (H) 07/27/2020   Lab Results  Component Value Date   CHOL 162 07/27/2020   HDL 38 (L) 07/27/2020   LDLCALC 93 07/27/2020   TRIG 182 (H) 07/27/2020   CHOLHDL 5.2 (H) 01/28/2020   Lab Results  Component Value Date   VD25OH 26.3 (L) 07/27/2020   Lab Results  Component Value Date   WBC 5.6 07/27/2020   HGB 13.1 07/27/2020   HCT 40.6 07/27/2020   MCV 91 07/27/2020   PLT 208 02/22/2020   No results found for: IRON, TIBC, FERRITIN  Attestation Statements:   Reviewed by clinician on day of visit: allergies,  medications, problem list, medical history, surgical history, family history, social history, and previous encounter notes.  I, Jackson Latino, RMA, am acting as Energy manager for [place], FNP.   I have reviewed the above documentation for accuracy and completeness, and I agree with the above. -  Jesse Sans, FNP

## 2020-10-20 ENCOUNTER — Telehealth: Payer: Self-pay | Admitting: Cardiology

## 2020-10-20 ENCOUNTER — Encounter (INDEPENDENT_AMBULATORY_CARE_PROVIDER_SITE_OTHER): Payer: Self-pay | Admitting: Family Medicine

## 2020-10-20 NOTE — Telephone Encounter (Signed)
Patient medication methadone (DOLOPHINE) 10 MG/5ML solution was increase in dosage and she is calling in to make sure it would be okay with heart to take. Please advise

## 2020-10-20 NOTE — Telephone Encounter (Signed)
Spoke to the patient just now and let her know Dr. Vanetta Shawl recommendations. She verbalizes understanding. She requests a letter stating this as well as she needs to give it to her provider.

## 2020-10-21 NOTE — Telephone Encounter (Signed)
She wants a letter stating what?

## 2020-10-22 NOTE — Telephone Encounter (Signed)
After discussing with Dr. Bing Matter, letter was comprised for patient to pick up. Called her, she will be by on Monday to get the letter. She thanked me for calling her.

## 2020-11-09 ENCOUNTER — Ambulatory Visit (INDEPENDENT_AMBULATORY_CARE_PROVIDER_SITE_OTHER): Payer: Medicaid Other | Admitting: Adult Health

## 2020-11-10 ENCOUNTER — Encounter (INDEPENDENT_AMBULATORY_CARE_PROVIDER_SITE_OTHER): Payer: Self-pay

## 2020-11-25 DIAGNOSIS — F419 Anxiety disorder, unspecified: Secondary | ICD-10-CM | POA: Insufficient documentation

## 2020-11-25 DIAGNOSIS — Z973 Presence of spectacles and contact lenses: Secondary | ICD-10-CM | POA: Insufficient documentation

## 2020-11-25 DIAGNOSIS — I38 Endocarditis, valve unspecified: Secondary | ICD-10-CM | POA: Insufficient documentation

## 2020-11-25 DIAGNOSIS — R12 Heartburn: Secondary | ICD-10-CM | POA: Insufficient documentation

## 2020-11-25 DIAGNOSIS — K219 Gastro-esophageal reflux disease without esophagitis: Secondary | ICD-10-CM | POA: Insufficient documentation

## 2020-11-25 DIAGNOSIS — M7989 Other specified soft tissue disorders: Secondary | ICD-10-CM | POA: Insufficient documentation

## 2020-11-25 DIAGNOSIS — F319 Bipolar disorder, unspecified: Secondary | ICD-10-CM | POA: Insufficient documentation

## 2020-11-25 DIAGNOSIS — F1910 Other psychoactive substance abuse, uncomplicated: Secondary | ICD-10-CM | POA: Insufficient documentation

## 2020-11-25 DIAGNOSIS — K8020 Calculus of gallbladder without cholecystitis without obstruction: Secondary | ICD-10-CM | POA: Insufficient documentation

## 2020-11-25 DIAGNOSIS — F4310 Post-traumatic stress disorder, unspecified: Secondary | ICD-10-CM | POA: Insufficient documentation

## 2020-11-25 DIAGNOSIS — T8149XA Infection following a procedure, other surgical site, initial encounter: Secondary | ICD-10-CM | POA: Insufficient documentation

## 2020-11-26 ENCOUNTER — Ambulatory Visit: Payer: Medicaid Other | Admitting: Cardiology

## 2020-12-10 ENCOUNTER — Ambulatory Visit: Payer: Medicaid Other | Admitting: Cardiology

## 2021-08-03 ENCOUNTER — Encounter: Payer: Self-pay | Admitting: Cardiology

## 2021-08-03 ENCOUNTER — Ambulatory Visit (INDEPENDENT_AMBULATORY_CARE_PROVIDER_SITE_OTHER): Payer: Self-pay | Admitting: Cardiology

## 2021-08-03 VITALS — BP 120/80 | HR 67 | Ht 68.0 in | Wt 397.0 lb

## 2021-08-03 DIAGNOSIS — E782 Mixed hyperlipidemia: Secondary | ICD-10-CM

## 2021-08-03 DIAGNOSIS — Z9889 Other specified postprocedural states: Secondary | ICD-10-CM

## 2021-08-03 DIAGNOSIS — M7989 Other specified soft tissue disorders: Secondary | ICD-10-CM

## 2021-08-03 DIAGNOSIS — K432 Incisional hernia without obstruction or gangrene: Secondary | ICD-10-CM

## 2021-08-03 DIAGNOSIS — F3289 Other specified depressive episodes: Secondary | ICD-10-CM

## 2021-08-03 NOTE — Progress Notes (Signed)
Cardiology Office Note:    Date:  08/03/2021   ID:  Heather Day, DOB 1985/08/02, MRN [id number]  PCP:  Patient, No Pcp Per (Inactive)  Cardiologist:  [name], MD    Referring MD: No ref. provider found   Chief Complaint  Patient presents with   need referrals to multiple specialist and PCP    History of Present Illness:    Heather Day is a 36 y.o. female with past medical history significant for IV drug abuse however she is remain clean for more than 4 years.  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  She did quite well she is staying clean the issues with facing milligrams morbid obesity she also got multiple hernias.  Blood pressure creeping up a little bit overall she is doing well she did have.  Of depression and last time she missed appointment but now she is back to herself follow-up by psychiatry doing well overall she does have insurance.  She would like to be referred to primary care physician she also would like to be referred to weight management program however she did before visit to our program in [facility] but she did not like it too much.  She did have conversation about gastric bypass surgery but she need to lose some weight she need to have some psychological counseling before that she need to simply be in the while in the program for it.  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   Complex valvuloplasty including autologous pericardial patch leaflet augmentation and artificial Gore-tex neochord placement x4 with 30 mm Edwards mc3 ring annuloplasty    Severe tricuspid regurgitation    SOB (shortness of breath)    Swelling of both lower extremities    Wears glasses    Wound infection after surgery    Sternal wound    Past Surgical History:  Procedure Laterality Date   APPENDECTOMY     APPLICATION OF WOUND VAC N/A 05/16/2018   Procedure: APPLICATION OF WOUND VAC;  Surgeon: Purcell Nails, MD;  Location: [facility] OR;  Service: Thoracic;  Laterality: N/A;   CARDIAC SURGERY     CESAREAN SECTION     CHOLECYSTECTOMY N/A 01/15/2018   Procedure: LAPAROSCOPIC CHOLECYSTECTOMY ERAS PATHWAY;  Surgeon: Glenna Fellows, MD;  Location: [facility] OR;  Service: General;  Laterality: N/A;   INTRAOPERATIVE CHOLANGIOGRAM N/A 01/15/2018   Procedure: INTRAOPERATIVE CHOLANGIOGRAM;  Surgeon: Glenna Fellows, MD;  Location: [facility] OR;  Service: General;  Laterality: N/A;   LAPAROSCOPIC APPENDECTOMY N/A 07/04/2017   Procedure: APPENDECTOMY LAPAROSCOPIC;  Surgeon: Luretha Murphy, MD;  Location: [facility] ORS;  Service: General;  Laterality: N/A;   MULTIPLE EXTRACTIONS WITH ALVEOLOPLASTY Bilateral 11/29/2017   Procedure: Extraction of tooth #'s 2,5-15, and 31 with alveoloplasty and gross debridement of remaining dentition;  Surgeon: Charlynne Pander, DDS;  Location: [facility] OR;  Service: Oral Surgery;  Laterality: Bilateral;   STERNAL WOUND DEBRIDEMENT N/A 05/16/2018   Procedure: STERNAL WOUND DEBRIDEMENT  with Sternal wire removal x2.;  Surgeon: Purcell Nailswen, Clarence H, MD;  [facility];  Service: Thoracic;  Laterality: N/A;   TEE WITHOUT CARDIOVERSION N/A 10/03/2017   Procedure: TRANSESOPHAGEAL ECHOCARDIOGRAM (TEE);  Surgeon: Jodelle Redhristopher, Bridgette, MD;  [facility];  Service: Cardiovascular;  Laterality: N/A;   TEE WITHOUT CARDIOVERSION N/A 04/18/2018   Procedure: TRANSESOPHAGEAL ECHOCARDIOGRAM (TEE);  Surgeon: Purcell Nailswen, Clarence H, MD;  [facility];  Service: Open Heart Surgery;  Laterality: N/A;   TRICUSPID VALVE REPLACEMENT N/A 04/18/2018   Procedure: TRICUSPID VALVE REPAIR;   Surgeon: Purcell Nailswen, Clarence H, MD;  [facility];  Service: Open Heart Surgery;  Laterality: N/A;    Current Medications: Current Meds  Medication Sig   furosemide (LASIX) 40 MG tablet Take 1 tablet by mouth once daily (Patient taking differently: Take 40 mg by mouth daily.)   methadone (DOLOPHINE) 10 MG/5ML solution Take 175 mg by mouth daily. Patient uncertain of mg   NON FORMULARY Take 1 tablet by mouth daily. Healthy skin, hair and nails gummy   potassium chloride (KLOR-CON) 10 MEQ tablet Take 1 tablet by mouth once daily (Patient taking differently: Take 10 mEq by mouth daily.)   Vitamin D, Ergocalciferol, (DRISDOL) 1.25 MG (50000 UNIT) CAPS capsule Take 1 capsule (50,000 Units total) by mouth every 7 (seven) days.   [DISCONTINUED] buPROPion (WELLBUTRIN SR) 150 MG 12 hr tablet Take 1 tablet (150 mg total) by mouth daily with breakfast.   [DISCONTINUED] metFORMIN (GLUCOPHAGE) 500 MG tablet Take 1 tablet (500 mg total) by mouth 2 (two) times daily with a meal.     Allergies:   Patient has no known allergies.   Social History   Socioeconomic History   Marital status: Single    Spouse name: Not on file   Number of children: Not on file   Years of education: Not on file   Highest education level: Not on file  Occupational History   Occupation: Doctor, general practiceContent Creator  Tobacco Use   Smoking status: Former    Packs/day: 1.00    Years: 20.00    Pack years: 20.00    Types: Cigarettes    Quit date: 04/26/2017    Years since quitting: 4.2   Smokeless tobacco: Never  Vaping Use   Vaping Use: Never used  Substance and Sexual Activity   Alcohol use: No   Drug use: Not Currently    Types: IV, Heroin    Comment: clean since 10/09/16   Sexual activity: Not on file  Other Topics Concern   Not on file  Social History Narrative   Not on file   Social Determinants of Health   Financial Resource Strain: Not on file  Food Insecurity: Not on file  Transportation Needs: Not on file  Physical  Activity: Not on file  Stress: Not on file  Social Connections: Not on file     Family History: The patient's family history includes Alcoholism in her father; Anxiety disorder in her mother; Bipolar disorder in her mother; Depression in her father and mother; Healthy in her father; Hypertension in her father; Hypotension in her mother; Hypothyroidism in her mother; Thyroid disease in her mother. ROS:   Please see the history of present illness.    All 14 point review of systems negative except as described per history of present illness  EKGs/Labs/Other Studies Reviewed:      Recent Labs: No results found for requested labs within last 8760 hours.  Recent Lipid Panel    Component Value  Date/Time   CHOL 162 07/27/2020 1100   TRIG 182 (H) 07/27/2020 1100   HDL 38 (L) 07/27/2020 1100   CHOLHDL 5.2 (H) 01/28/2020 1504   LDLCALC 93 07/27/2020 1100    Physical Exam:    VS:  BP 120/80 (BP Location: Left Arm, Patient Position: Sitting)   Pulse 67   Ht 5\' 8"  (1.727 m)   Wt (!) 397 lb (180.1 kg)   SpO2 97%   BMI 60.36 kg/m     Wt Readings from Last 3 Encounters:  08/03/21 (!) 397 lb (180.1 kg)  10/19/20 (!) 386 lb (175.1 kg)  09/28/20 (!) 386 lb (175.1 kg)     GEN:  Well nourished, well developed in no acute distress HEENT: Normal NECK: No JVD; No carotid bruits LYMPHATICS: No lymphadenopathy CARDIAC: RRR, no murmurs, no rubs, no gallops RESPIRATORY:  Clear to auscultation without rales, wheezing or rhonchi  ABDOMEN: Soft, non-tender, non-distended MUSCULOSKELETAL:  No edema; No deformity  SKIN: Warm and dry LOWER EXTREMITIES: no swelling NEUROLOGIC:  Alert and oriented x 3 PSYCHIATRIC:  Normal affect   ASSESSMENT:    1. S/P tricuspid valve repair   2. Mixed hyperlipidemia   3. Swelling of both lower extremities   4. Other depression    PLAN:    In order of problems listed above:  History of drug abuse she is clean.  Doing very well overall she did have some  depression last winter however now things are looking much better Status post tricuspid valve repair.  I will ask her to have an echocardiogram repeated overall sounds good she is feeling well Swelling of lower extremities minimal today on the physical exam. Depression to be followed by psychiatry. Morbid obesity huge problem there is no question that if she lose some weight things will look much better.  We will wait for interim medicine team to see her and talk to her about potential weight management she probably will require to referral to some weight management program   Medication Adjustments/Labs and Tests Ordered: Current medicines are reviewed at length with the patient today.  Concerns regarding medicines are outlined above.  No orders of the defined types were placed in this encounter.  Medication changes: No orders of the defined types were placed in this encounter.   Signed, 05-29-1987, MD, [facility] 08/03/2021 1:51 PM    [facility]

## 2021-08-03 NOTE — Patient Instructions (Addendum)
Medication Instructions:  Your physician recommends that you continue on your current medications as directed. Please refer to the Current Medication list given to you today.  *If you need a refill on your cardiac medications before your next appointment, please call your pharmacy*   Lab Work: None Ordered If you have labs (blood work) drawn today and your tests are completely normal, you will receive your results only by: MyChart Message (if you have MyChart) OR A paper copy in the mail If you have any lab test that is abnormal or we need to change your treatment, we will call you to review the results.   Testing/Procedures: Your physician has requested that you have an echocardiogram. Echocardiography is a painless test that uses sound waves to create images of your heart. It provides your doctor with information about the size and shape of your heart and how well your heart's chambers and valves are working. This procedure takes approximately one hour. There are no restrictions for this procedure.    Follow-Up: At [facility], you and your health needs are our priority.  As part of our continuing mission to provide you with exceptional heart care, we have created designated Provider Care Teams.  These Care Teams include your primary Cardiologist (physician) and Advanced Practice Providers (APPs -  Physician Assistants and Nurse Practitioners) who all work together to provide you with the care you need, when you need it.  We recommend signing up for the patient portal called "MyChart".  Sign up information is provided on this After Visit Summary.  MyChart is used to connect with patients for Virtual Visits (Telemedicine).  Patients are able to view lab/test results, encounter notes, upcoming appointments, etc.  Non-urgent messages can be sent to your provider as well.   To learn more about what you can do with MyChart, go to [email or web address].    Your next appointment:   6  month(s)  The format for your next appointment:   In Person  Provider:   [name], MD    Other Instructions Referral to Primary Care Physician- [facility]- They will call for appt

## 2021-08-17 ENCOUNTER — Ambulatory Visit (HOSPITAL_BASED_OUTPATIENT_CLINIC_OR_DEPARTMENT_OTHER)
Admission: RE | Admit: 2021-08-17 | Discharge: 2021-08-17 | Disposition: A | Discharge status: Home / Self Care | Payer: BC Managed Care – PPO | Source: Ambulatory Visit | Attending: Cardiology | Admitting: Cardiology

## 2021-08-17 DIAGNOSIS — Z9889 Other specified postprocedural states: Secondary | ICD-10-CM | POA: Insufficient documentation

## 2021-08-17 DIAGNOSIS — Z954 Presence of other heart-valve replacement: Secondary | ICD-10-CM

## 2021-08-17 LAB — ECHOCARDIOGRAM COMPLETE
AR max vel: 3.21 cm2
AV Area VTI: 3.26 cm2
AV Area mean vel: 3.3 cm2
AV Mean grad: 4 mmHg
AV Peak grad: 7 mmHg
Ao pk vel: 1.32 m/s
Area-P 1/2: 3.72 cm2
S' Lateral: 4.1 cm

## 2021-08-17 NOTE — Progress Notes (Signed)
  Echocardiogram 2D Echocardiogram has been performed.  Roosvelt Maser F 08/17/2021, 12:56 PM

## 2021-08-22 ENCOUNTER — Telehealth: Payer: Self-pay

## 2021-08-22 NOTE — Telephone Encounter (Signed)
LM per DPR on VM.

## 2021-10-19 ENCOUNTER — Encounter (INDEPENDENT_AMBULATORY_CARE_PROVIDER_SITE_OTHER): Payer: Self-pay

## 2021-11-24 ENCOUNTER — Ambulatory Visit: Payer: BC Managed Care – PPO | Admitting: Family

## 2021-12-09 LAB — COMPREHENSIVE METABOLIC PANEL
Calcium: 9.8 (ref 8.7–10.7)
Globulin: 2.8
eGFR: 77

## 2021-12-09 LAB — TSH: TSH: 4.82 (ref 0.41–5.90)

## 2021-12-09 LAB — BASIC METABOLIC PANEL
BUN: 19 (ref 4–21)
CO2: 29 — AB (ref 13–22)
Chloride: 102 (ref 99–108)
Creatinine: 0.8 (ref 0.5–1.1)
Glucose: 118
Sodium: 139 (ref 137–147)

## 2021-12-09 LAB — LIPID PANEL
Cholesterol: 157 (ref 0–200)
HDL: 36 (ref 35–70)
LDL Cholesterol: 100
LDl/HDL Ratio: 4.6
Triglycerides: 268 — AB (ref 40–160)

## 2021-12-09 LAB — HEPATIC FUNCTION PANEL
ALT: 22 U/L (ref 7–35)
AST: 24 (ref 13–35)
Bilirubin, Total: 0.4

## 2021-12-09 LAB — VITAMIN D 25 HYDROXY (VIT D DEFICIENCY, FRACTURES): Vit D, 25-Hydroxy: 48.7

## 2021-12-09 LAB — CBC: RBC: 4.55 (ref 3.87–5.11)

## 2021-12-09 LAB — HM HEPATITIS C SCREENING LAB: HM Hepatitis Screen: NEGATIVE

## 2021-12-09 LAB — CBC AND DIFFERENTIAL
HCT: 30 — AB (ref 36–46)
Hemoglobin: 13.5 (ref 12.0–16.0)
WBC: 5.6

## 2021-12-09 LAB — HEMOGLOBIN A1C: Hemoglobin A1C: 6.4

## 2021-12-09 LAB — VITAMIN B12: Vitamin B-12: 330

## 2021-12-22 ENCOUNTER — Ambulatory Visit: Payer: BC Managed Care – PPO | Admitting: Nurse Practitioner

## 2021-12-22 ENCOUNTER — Encounter: Payer: Self-pay | Admitting: Nurse Practitioner

## 2021-12-22 ENCOUNTER — Ambulatory Visit (INDEPENDENT_AMBULATORY_CARE_PROVIDER_SITE_OTHER): Payer: BC Managed Care – PPO | Admitting: Nurse Practitioner

## 2021-12-22 VITALS — BP 118/82 | HR 82 | Temp 96.6°F | Ht 68.5 in | Wt >= 6400 oz

## 2021-12-22 DIAGNOSIS — K439 Ventral hernia without obstruction or gangrene: Secondary | ICD-10-CM | POA: Insufficient documentation

## 2021-12-22 DIAGNOSIS — F3170 Bipolar disorder, currently in remission, most recent episode unspecified: Secondary | ICD-10-CM

## 2021-12-22 DIAGNOSIS — Z23 Encounter for immunization: Secondary | ICD-10-CM | POA: Diagnosis not present

## 2021-12-22 DIAGNOSIS — M7989 Other specified soft tissue disorders: Secondary | ICD-10-CM

## 2021-12-22 DIAGNOSIS — E782 Mixed hyperlipidemia: Secondary | ICD-10-CM

## 2021-12-22 DIAGNOSIS — F1911 Other psychoactive substance abuse, in remission: Secondary | ICD-10-CM

## 2021-12-22 DIAGNOSIS — R7303 Prediabetes: Secondary | ICD-10-CM | POA: Diagnosis not present

## 2021-12-22 DIAGNOSIS — R7989 Other specified abnormal findings of blood chemistry: Secondary | ICD-10-CM

## 2021-12-22 DIAGNOSIS — E559 Vitamin D deficiency, unspecified: Secondary | ICD-10-CM

## 2021-12-22 DIAGNOSIS — Z9889 Other specified postprocedural states: Secondary | ICD-10-CM

## 2021-12-22 DIAGNOSIS — N926 Irregular menstruation, unspecified: Secondary | ICD-10-CM | POA: Diagnosis not present

## 2021-12-22 DIAGNOSIS — F1120 Opioid dependence, uncomplicated: Secondary | ICD-10-CM

## 2021-12-22 LAB — POCT GLYCOSYLATED HEMOGLOBIN (HGB A1C)
HbA1c POC (<> result, manual entry): 5.9 % (ref 4.0–5.6)
HbA1c, POC (prediabetic range): 5.9 % (ref 5.7–6.4)
Hemoglobin A1C: 5.9 % — AB (ref 4.0–5.6)

## 2021-12-22 LAB — POCT URINE PREGNANCY: Preg Test, Ur: NEGATIVE

## 2021-12-22 MED ORDER — OZEMPIC (0.25 OR 0.5 MG/DOSE) 2 MG/1.5ML ~~LOC~~ SOPN: 0.2500 mg | PEN_INJECTOR | SUBCUTANEOUS | 1 refills | Status: DC

## 2021-12-22 NOTE — Assessment & Plan Note (Signed)
BMI 60.1.  She has been trying to decrease the amount of sugary drinks that she is drinking and exercise as able.  She has lost 17 pounds in the last 3 weeks, congratulated her on this.  We will have her start semaglutide injections 0.25 mg injection weekly to help with weight loss, prediabetes, and polyphagia.  Follow-up in 6 to 8 weeks.

## 2021-12-22 NOTE — Patient Instructions (Addendum)
It was great to see you!  Start semaglutide injection once a week to help with weight loss. Start 0.25mg  injection for 4 weeks then increase to 0.5mg  injection weekly. Make sure you are drinking plenty of fluids.   I have placed a referral to general surgery for your hernias.   Let's follow-up in 4-6 weeks, sooner if you have concerns.  If a referral was placed today, you will be contacted for an appointment. Please note that routine referrals can sometimes take up to 3-4 weeks to process. Please call our office if you haven't heard anything after this time frame.  Take care,  Vance Peper, NP

## 2021-12-22 NOTE — Assessment & Plan Note (Signed)
She has been clean from IV heroin for the past 4 years, congratulated her on this!

## 2021-12-22 NOTE — Assessment & Plan Note (Signed)
She states her last TSH was normal. Requesting prior lab results.

## 2021-12-22 NOTE — Assessment & Plan Note (Addendum)
Will place referral to general surgery for evaluation.

## 2021-12-22 NOTE — Assessment & Plan Note (Signed)
She was told that her cholesterol has improved from the last time it was checked.  We will request records from methadone clinic where she had labs done.

## 2021-12-22 NOTE — Assessment & Plan Note (Signed)
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  Continue collaboration recommendations from cardiology.

## 2021-12-22 NOTE — Assessment & Plan Note (Signed)
Follows with methadone clinic. Continue collaboration and recommendations from provider.

## 2021-12-22 NOTE — Assessment & Plan Note (Addendum)
Chronic, stable.  Continue furosemide 40 mg and potassium 10 mEq as needed for swelling.

## 2021-12-22 NOTE — Assessment & Plan Note (Signed)
Chronic, stable.  Continue Lamictal 25 mg daily and Vistaril as needed.  She is currently getting these medications from a provider with the methadone clinic.  Continue collaboration recommendations from this provider.

## 2021-12-22 NOTE — Assessment & Plan Note (Signed)
A1c today is 5.9%.  Discussed nutrition, exercise.  We will start semaglutide 0.25 mg injection weekly to help with weight loss, sugars, polyphagia.

## 2021-12-22 NOTE — Assessment & Plan Note (Signed)
She has a history of vitamin D deficiency and is taking a multivitamin daily when she remembers.  She had lab work done recently for the methadone clinic, will request lab results.

## 2021-12-22 NOTE — Progress Notes (Signed)
New Patient Visit  BP 118/82   Pulse 82   Temp (!) 96.6 F (35.9 C) (Temporal)   Ht 5' 8.5" (1.74 m)   Wt (!) 401 lb 3.2 oz (182 kg)   LMP 09/15/2021 (Approximate)   SpO2 94%   BMI 60.11 kg/m    Subjective:    Patient ID: Heather Day, female    DOB: 06/04/1985, 36 y.o.   MRN: [id number]  CC: Chief Complaint  Patient presents with   Establish Care    Np. Est care. Overall health assessment. Discuss thyroid levels, weight management, pre-diabetes.     HPI: Heather Day is a [age] y.o. female presents for new patient visit to establish care.  Introduced to Publishing rights managernurse practitioner role and practice setting.  All questions answered.  Discussed provider/patient relationship and expectations.  Leidi has not been to a primary care provider in many years.  She has been following with a cardiologist and her methadone clinic for the past several years.  She has a history of IV drug use and has been clean for the past 4 years.  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  She is still following with cardiology and states that she is doing well.  She is currently also following with the methadone clinic that is also helping to treat her bipolar and anxiety.  She is currently taking methadone 175 mg daily, Lamictal 25 mg daily, and Vistaril 25 mg daily as needed.  Overall she feels like her mood is well controlled.  She was having some foot and ankle swelling a few months ago and cardiology prescribed furosemide 40 mg.  She has been taking this as needed along with the potassium 10 mEq if she takes the furosemide.  The swelling has improved.  She states that since she stopped using IV drugs, her weight has increased.  This is the heaviest that she has been.  She has been recently trying to adjust her diet, cut out sugary drinks and she has lost 17 pounds in the last 3 weeks.  She is interested in medication if that is a possibility to help with weight  loss.  She has noted a hernia in her abdomen for the past several years.  She also has a umbilical hernia as well.  She states that this causes her to feel bloated at times and mild discomfort, however not pain.  She also has noticed that she has not had her period for the last 4 months.  She states that since menarche, her periods have been regular monthly and this is the first time it has ever been irregular.  She has taken multiple pregnancy tests at home and they are negative.     12/22/2021   11:50 AM 07/27/2020    9:59 AM  Depression screen PHQ 2/9  Decreased Interest 1 3  Down, Depressed, Hopeless 1 3  PHQ - 2 Score 2 6  Altered sleeping 1 0  Tired, decreased energy 1 3  Change in appetite 1 3  Feeling bad or failure about yourself  0 1  Trouble concentrating 1 3  Moving slowly or fidgety/restless 0 2  Suicidal thoughts 0 0  PHQ-9 Score 6 18  Difficult doing work/chores Somewhat difficult       12/22/2021   11:50 AM  GAD 7 : Generalized Anxiety Score  Nervous, Anxious, on Edge 2  Control/stop worrying 2  Worry too much - different things 1  Trouble relaxing 1  Restless 0  Easily annoyed or irritable 0  Afraid - awful might happen 1  Total GAD 7 Score 7  Anxiety Difficulty Not difficult at all    Past Medical History:  Diagnosis Date   Anxiety    panic attacks   Bipolar disorder ([place])    Depression    Drug abuse, IV (HCC)    Dyspnea    when walking up stairs   Gallstone    GERD (gastroesophageal reflux disease)    Heart valve problem    Heartburn    Hepatitis C    Hernia of abdominal wall    Periodontitis chronic, apical    dental caries   Pre-diabetes    PTSD (post-traumatic stress disorder)    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   Complex valvuloplasty including autologous pericardial patch leaflet augmentation and artificial Gore-tex neochord placement x4 with 30 mm Edwards mc3 ring annuloplasty   Severe tricuspid regurgitation    SOB (shortness  of breath)    Swelling of both lower extremities    Wears glasses    Wound infection after surgery    Sternal wound    Past Surgical History:  Procedure Laterality Date   APPENDECTOMY     APPLICATION OF WOUND VAC N/A 05/16/2018   Procedure: APPLICATION OF WOUND VAC;  Surgeon: Rexene Alberts, MD;  Location: [place];  Service: Thoracic;  Laterality: N/A;   CARDIAC SURGERY     CESAREAN SECTION     CHOLECYSTECTOMY N/A 01/15/2018   Procedure: LAPAROSCOPIC CHOLECYSTECTOMY ERAS PATHWAY;  Surgeon: Excell Seltzer, MD;  Location: [place];  Service: General;  Laterality: N/A;   INTRAOPERATIVE CHOLANGIOGRAM N/A 01/15/2018   Procedure: INTRAOPERATIVE CHOLANGIOGRAM;  Surgeon: Excell Seltzer, MD;  Location: [place];  Service: General;  Laterality: N/A;   LAPAROSCOPIC APPENDECTOMY N/A 07/04/2017   Procedure: APPENDECTOMY LAPAROSCOPIC;  Surgeon: Johnathan Hausen, MD;  Location: [facility];  Service: General;  Laterality: N/A;   MULTIPLE EXTRACTIONS WITH ALVEOLOPLASTY Bilateral 11/29/2017   Procedure: Extraction of tooth #'s 2,5-15, and 31 with alveoloplasty and gross debridement of remaining dentition;  Surgeon: Lenn Cal, DDS;  Location: [place];  Service: Oral Surgery;  Laterality: Bilateral;   STERNAL WOUND DEBRIDEMENT N/A 05/16/2018   Procedure: STERNAL WOUND DEBRIDEMENT with Sternal wire removal x2.;  Surgeon: Rexene Alberts, MD;  Location: [facility];  Service: Thoracic;  Laterality: N/A;   TEE WITHOUT CARDIOVERSION N/A 10/03/2017   Procedure: TRANSESOPHAGEAL ECHOCARDIOGRAM (TEE);  Surgeon: Buford Dresser, MD;  Location: [facility];  Service: Cardiovascular;  Laterality: N/A;   TEE WITHOUT CARDIOVERSION N/A 04/18/2018   Procedure: TRANSESOPHAGEAL ECHOCARDIOGRAM (TEE);  Surgeon: Rexene Alberts, MD;  Location: [place];  Service: Open Heart Surgery;  Laterality: N/A;   TRICUSPID VALVE REPLACEMENT N/A 04/18/2018   Procedure: TRICUSPID VALVE REPAIR;  Surgeon: Rexene Alberts, MD;  Location: [place];   Service: Open Heart Surgery;  Laterality: N/A;    Family History  Problem Relation Age of Onset   Hypothyroidism Mother    Hypotension Mother    Thyroid disease Mother    Depression Mother    Anxiety disorder Mother    Bipolar disorder Mother    ADD / ADHD Mother    Varicose Veins Mother    Hypertension Father    Depression Father    Alcoholism Father    Anxiety disorder Brother    ADD / ADHD Son    Obesity Maternal Aunt    Obesity Maternal Aunt  Hearing loss Maternal Uncle    Obesity Maternal Uncle    Diabetes Maternal Grandmother    Stroke Maternal Grandmother    Early death Maternal Grandfather    Miscarriages / Stillbirths Paternal Grandmother    Obesity Paternal Grandmother    Varicose Veins Paternal Grandmother    Diabetes Paternal Grandfather    Stroke Paternal Grandfather      Social History   Tobacco Use   Smoking status: Former    Packs/day: 1.00    Years: 20.00    Total pack years: 20.00    Types: Cigarettes    Quit date: 04/26/2017    Years since quitting: 4.6   Smokeless tobacco: Never  Vaping Use   Vaping Use: Never used  Substance Use Topics   Alcohol use: No   Drug use: Not Currently    Types: Heroin    Comment: Sober for 5 years    Current Outpatient Medications on File Prior to Visit  Medication Sig Dispense Refill   Multiple Vitamins-Minerals (MULTIVITAMIN WITH MINERALS) tablet Take 1 tablet by mouth daily.     furosemide (LASIX) 40 MG tablet Take 1 tablet by mouth once daily (Patient taking differently: Take 40 mg by mouth daily as needed for fluid.) 30 tablet 6   LAMICTAL 25 MG tablet Take 50 mg by mouth daily.     methadone (DOLOPHINE) 10 MG/5ML solution Take 175 mg by mouth daily. Patient uncertain of mg     NON FORMULARY Take 1 tablet by mouth daily. Healthy skin, hair and nails gummy     potassium chloride (KLOR-CON) 10 MEQ tablet Take 1 tablet by mouth once daily (Patient taking differently: Take 10 mEq by mouth daily.) 30 tablet 6    VISTARIL 25 MG capsule Take 50 mg by mouth at bedtime as needed.     No current facility-administered medications on file prior to visit.     Review of Systems  Constitutional: Negative.   HENT: Negative.    Eyes: Negative.   Respiratory: Negative.    Cardiovascular: Negative.   Gastrointestinal:  Positive for abdominal distention and abdominal pain. Negative for constipation and diarrhea.  Genitourinary:  Positive for menstrual problem (no period for 4 months).  Musculoskeletal: Negative.   Skin: Negative.   Neurological: Negative.   Psychiatric/Behavioral:  Negative for dysphoric mood. The patient is nervous/anxious.       Objective:    BP 118/82   Pulse 82   Temp (!) 96.6 F (35.9 C) (Temporal)   Ht 5' 8.5" (1.74 m)   Wt (!) 401 lb 3.2 oz (182 kg)   LMP 09/15/2021 (Approximate)   SpO2 94%   BMI 60.11 kg/m   Wt Readings from Last 3 Encounters:  12/22/21 (!) 401 lb 3.2 oz (182 kg)  08/03/21 (!) 397 lb (180.1 kg)  10/19/20 (!) 386 lb (175.1 kg)    BP Readings from Last 3 Encounters:  12/22/21 118/82  08/03/21 120/80  10/19/20 128/80    Physical Exam Vitals and nursing note reviewed.  Constitutional:      General: She is not in acute distress.    Appearance: Normal appearance. She is obese.  HENT:     Head: Normocephalic.     Right Ear: Tympanic membrane, ear canal and external ear normal.     Left Ear: Tympanic membrane, ear canal and external ear normal.  Eyes:     Conjunctiva/sclera: Conjunctivae normal.  Cardiovascular:     Rate and Rhythm: Normal rate and regular rhythm.  Pulses: Normal pulses.     Heart sounds: Normal heart sounds.  Pulmonary:     Effort: Pulmonary effort is normal.     Breath sounds: Normal breath sounds.  Abdominal:     Palpations: Abdomen is soft.     Tenderness: There is no abdominal tenderness.     Hernia: A hernia is present.  Musculoskeletal:     Cervical back: Normal range of motion and neck supple. No tenderness.      Right lower leg: No edema.     Left lower leg: No edema.  Lymphadenopathy:     Cervical: No cervical adenopathy.  Skin:    General: Skin is warm.  Neurological:     General: No focal deficit present.     Mental Status: She is alert and oriented to person, place, and time.  Psychiatric:        Mood and Affect: Mood normal.        Behavior: Behavior normal.        Thought Content: Thought content normal.        Judgment: Judgment normal.        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  Continue collaboration recommendations from cardiology.      Morbid obesity (HCC)    BMI 60.1.  She has been trying to decrease the amount of sugary drinks that she is drinking and exercise as able.  She has lost 17 pounds in the last 3 weeks, congratulated her on this.  We will have her start semaglutide injections 0.25 mg injection weekly to help with weight loss, prediabetes, and polyphagia.  Follow-up in 6 to 8 weeks.      Relevant Medications   Semaglutide,0.25 or 0.5MG /DOS, (OZEMPIC, 0.25 OR 0.5 MG/DOSE,) 2 MG/1.5ML SOPN   Prediabetes - Primary    A1c today is 5.9%.  Discussed nutrition, exercise.  We will start semaglutide 0.25 mg injection weekly to help with weight loss, sugars, polyphagia.      Relevant Orders   POCT glycosylated hemoglobin (Hb A1C) (Completed)   Mixed hyperlipidemia    She was told that her cholesterol has improved from the last time it was checked.  We will request records from methadone clinic where she had labs done.      Vitamin D deficiency    She has a history of vitamin D deficiency and is taking a multivitamin daily when she remembers.  She had lab work done recently for the methadone clinic,  will request lab results.      Opioid dependence on agonist therapy (HCC)    Follows with methadone clinic. Continue collaboration and recommendations from provider.       Swelling of both lower extremities    Chronic, stable.  Continue furosemide 40 mg and potassium 10 mEq as needed for swelling.      Bipolar disorder (HCC)    Chronic, stable.  Continue Lamictal 25 mg daily and Vistaril as needed.  She is currently getting these medications from a provider with the methadone clinic.  Continue collaboration recommendations from this provider.      Elevated TSH    She states  her last TSH was normal. Requesting prior lab results.       Hernia of abdominal wall    Will place referral to general surgery for evaluation.       Relevant Orders   Ambulatory referral to General Surgery   Other Visit Diagnoses     Missed period       She has not had a menstrual period in the last 4 months, usually it has come monthly.  Urine pregnancy negative in office.  We will refer to GYN if ongoing   Relevant Orders   POCT urine pregnancy (Completed)   Need for tetanus booster       Tdap booster given today   Relevant Orders   Tdap vaccine greater than or equal to 7yo IM (Completed)   Need for influenza vaccination       Flu vaccine given today   Relevant Orders   Flu Vaccine QUAD 72mo+IM (Fluarix, Fluzone & Alfiuria Quad PF) (Completed)        Follow up plan: Return in about 4 weeks (around 01/19/2022) for 4-6 weeks , CPE.

## 2021-12-28 ENCOUNTER — Telehealth: Payer: Self-pay

## 2021-12-28 NOTE — Telephone Encounter (Signed)
PA STARTED  DATE: 12/28/21  TIME: 1443  TAT OF DETERMINATION: 3-5 bus days  PAT NOTIFIED?  N/A  PA Key: [id number]

## 2022-01-03 ENCOUNTER — Telehealth: Payer: Self-pay

## 2022-01-03 NOTE — Telephone Encounter (Signed)
PA denied for Ozempic. Patient notified via covermymeds text msg on 01/03/22  PA KEY: [id number]

## 2022-01-18 NOTE — Progress Notes (Signed)
BP 123/71 (BP Location: Left Arm, Patient Position: Sitting, Cuff Size: Large)   Pulse 68   Temp (!) 97 F (36.1 C) (Temporal)   Ht 5' 8.5" (1.74 m)   Wt (!) 402 lb 12.8 oz (182.7 kg)   LMP 09/15/2021 (Approximate)   SpO2 92%   BMI 60.35 kg/m    Subjective:    Patient ID: Heather Day, female    DOB: 1985/10/22, 36 y.o.   MRN: [id number]  CC: Chief Complaint  Patient presents with   Annual Exam    CPE.  Wants to try something different Ozempic    HPI: Heather Day is a 36 y.o. female presenting on 01/19/2022 for comprehensive medical examination. Current medical complaints include: interested in other options for weight loss  Her health insurance did not approve her for ozempic. She is wondering if there are other options for weight loss medications or possibly surgery.   Menopausal Symptoms: no  Depression Screen done today and results listed below:     01/19/2022   11:41 AM 01/19/2022   10:50 AM 12/22/2021   11:50 AM 07/27/2020    9:59 AM  Depression screen PHQ 2/9  Decreased Interest 1 1 1 3   Down, Depressed, Hopeless 0 0 1 3  PHQ - 2 Score 1 1 2 6   Altered sleeping 1  1 0  Tired, decreased energy 1  1 3   Change in appetite 1  1 3   Feeling bad or failure about yourself  0  0 1  Trouble concentrating 1  1 3   Moving slowly or fidgety/restless 0  0 2  Suicidal thoughts 0  0 0  PHQ-9 Score 5  6 18   Difficult doing work/chores Somewhat difficult  Somewhat difficult     The patient does not have a history of falls. I did not complete a risk assessment for falls. A plan of care for falls was not documented.   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   Complex valvuloplasty including autologous pericardial patch leaflet augmentation and artificial Gore-tex neochord placement x4 with 30 mm Edwards mc3 ring annuloplasty   Severe tricuspid regurgitation    SOB (shortness of breath)    Swelling of both lower extremities    Wears glasses    Wound infection after surgery    Sternal wound    Surgical History:  Past Surgical History:  Procedure Laterality Date   APPENDECTOMY     APPLICATION OF WOUND VAC N/A 05/16/2018   Procedure: APPLICATION OF WOUND VAC;  Surgeon: Purcell Nails, MD;  Location: [facility] OR;  Service: Thoracic;  Laterality: N/A;   CARDIAC SURGERY     CESAREAN SECTION     CHOLECYSTECTOMY N/A 01/15/2018   Procedure: LAPAROSCOPIC CHOLECYSTECTOMY ERAS PATHWAY;  Surgeon: Glenna Fellows, MD;  Location: [facility] OR;  Service: General;  Laterality: N/A;   INTRAOPERATIVE CHOLANGIOGRAM N/A 01/15/2018   Procedure: INTRAOPERATIVE CHOLANGIOGRAM;  Surgeon: Glenna Fellows, MD;  Location: [facility] OR;  Service: General;  Laterality: N/A;   LAPAROSCOPIC APPENDECTOMY N/A 07/04/2017   Procedure: APPENDECTOMY LAPAROSCOPIC;  Surgeon: Luretha Murphy, MD;  Location: [facility] ORS;  Service: General;  Laterality: N/A;   MULTIPLE EXTRACTIONS WITH ALVEOLOPLASTY Bilateral 11/29/2017   Procedure: Extraction of tooth #'s 2,5-15, and 31 with alveoloplasty and gross debridement of remaining dentition;  Surgeon: Charlynne Pander, DDS;  Location: [facility] OR;  Service: Oral Surgery;  Laterality: Bilateral;   STERNAL WOUND DEBRIDEMENT N/A 05/16/2018   Procedure: STERNAL WOUND DEBRIDEMENT with Sternal wire removal x2.;  Surgeon: Purcell Nails, MD;  Location: [facility] OR;  Service: Thoracic;  Laterality: N/A;   TEE WITHOUT CARDIOVERSION N/A 10/03/2017   Procedure: TRANSESOPHAGEAL ECHOCARDIOGRAM (TEE);  Surgeon: Jodelle Red, MD;  Location: [facility] ENDOSCOPY;   Service: Cardiovascular;  Laterality: N/A;   TEE WITHOUT CARDIOVERSION N/A 04/18/2018   Procedure: TRANSESOPHAGEAL ECHOCARDIOGRAM (TEE);  Surgeon: Purcell Nails, MD;  Location: [facility] OR;  Service: Open Heart Surgery;  Laterality: N/A;   TRICUSPID VALVE REPLACEMENT N/A 04/18/2018   Procedure: TRICUSPID VALVE REPAIR;  Surgeon: Purcell Nails, MD;  Location: [facility] OR;  Service: Open Heart Surgery;  Laterality: N/A;    Medications:  Current Outpatient Medications on File Prior to Visit  Medication Sig   furosemide (LASIX) 40 MG tablet Take 1 tablet by mouth once daily (Patient taking differently: Take 40 mg by mouth daily as needed for fluid.)   LAMICTAL 25 MG tablet Take 50 mg by mouth daily.   methadone (DOLOPHINE) 10 MG/5ML solution Take 175 mg by mouth daily. Patient uncertain of mg   Multiple Vitamins-Minerals (MULTIVITAMIN WITH MINERALS) tablet Take 1 tablet by mouth daily.   NON FORMULARY Take 1 tablet by mouth daily. Healthy skin, hair and nails gummy   potassium chloride (KLOR-CON) 10 MEQ tablet Take 1 tablet by mouth once daily (Patient taking differently: Take 10 mEq by mouth daily.)   VISTARIL 25 MG capsule Take 50 mg by mouth at bedtime as needed.   No current facility-administered medications on file prior to visit.    Allergies:  No Known Allergies  Social History:  Social History   Socioeconomic History   Marital status: Single    Spouse name: Not on file   Number of children: Not on file   Years of education: Not on file   Highest education level: Not on file  Occupational History   Occupation: Doctor, general practice  Tobacco Use   Smoking status: Former    Packs/day: 1.00    Years: 20.00    Total pack years: 20.00    Types: Cigarettes    Quit date: 04/26/2017    Years since quitting: 4.7   Smokeless tobacco: Never  Vaping Use   Vaping Use: Never used  Substance and Sexual Activity   Alcohol use: No   Drug use: Not Currently    Types: Heroin    Comment: Sober for 5  years   Sexual activity: Yes  Other Topics Concern   Not on file  Social History Narrative   Not on file   Social Determinants of Health   Financial Resource Strain: Not on file  Food Insecurity: Not on file  Transportation Needs: Not on file  Physical Activity: Not on file  Stress: Not on file  Social Connections: Not on file  Intimate Partner Violence: Not on file   Social History   Tobacco Use  Smoking Status Former   Packs/day: 1.00  Years: 20.00   Total pack years: 20.00   Types: Cigarettes   Quit date: 04/26/2017   Years since quitting: 4.7  Smokeless Tobacco Never   Social History   Substance and Sexual Activity  Alcohol Use No    Family History:  Family History  Problem Relation Age of Onset   Hypothyroidism Mother    Hypotension Mother    Thyroid disease Mother    Depression Mother    Anxiety disorder Mother    Bipolar disorder Mother    ADD / ADHD Mother    Varicose Veins Mother    Hypertension Father    Depression Father    Alcoholism Father    Anxiety disorder Brother    ADD / ADHD Son    Obesity Maternal Aunt    Obesity Maternal Aunt    Hearing loss Maternal Uncle    Obesity Maternal Uncle    Diabetes Maternal Grandmother    Stroke Maternal Grandmother    Early death Maternal Grandfather    Miscarriages / Stillbirths Paternal Grandmother    Obesity Paternal Grandmother    Varicose Veins Paternal Grandmother    Diabetes Paternal Grandfather    Stroke Paternal Grandfather     Past medical history, surgical history, medications, allergies, family history and social history reviewed with patient today and changes made to appropriate areas of the chart.   Review of Systems  Constitutional:  Positive for malaise/fatigue. Negative for diaphoresis.  HENT: Negative.    Respiratory: Negative.    Cardiovascular: Negative.   Gastrointestinal: Negative.   Genitourinary: Negative.   Musculoskeletal: Negative.   Skin: Negative.   Neurological:  Negative.   Psychiatric/Behavioral: Negative.     All other ROS negative except what is listed above and in the HPI.      Objective:    BP 123/71 (BP Location: Left Arm, Patient Position: Sitting, Cuff Size: Large)   Pulse 68   Temp (!) 97 F (36.1 C) (Temporal)   Ht 5' 8.5" (1.74 m)   Wt (!) 402 lb 12.8 oz (182.7 kg)   LMP 09/15/2021 (Approximate)   SpO2 92%   BMI 60.35 kg/m   Wt Readings from Last 3 Encounters:  01/19/22 (!) 402 lb 12.8 oz (182.7 kg)  12/22/21 (!) 401 lb 3.2 oz (182 kg)  08/03/21 (!) 397 lb (180.1 kg)    Physical Exam Vitals and nursing note reviewed. Exam conducted with a chaperone present.  Constitutional:      General: She is not in acute distress.    Appearance: Normal appearance.  HENT:     Head: Normocephalic and atraumatic.     Right Ear: Tympanic membrane, ear canal and external ear normal.     Left Ear: Tympanic membrane, ear canal and external ear normal.  Eyes:     Conjunctiva/sclera: Conjunctivae normal.  Cardiovascular:     Rate and Rhythm: Normal rate and regular rhythm.     Pulses: Normal pulses.     Heart sounds: Normal heart sounds.  Pulmonary:     Effort: Pulmonary effort is normal.     Breath sounds: Normal breath sounds.  Chest:  Breasts:    Right: Normal.     Left: Normal.  Abdominal:     Palpations: Abdomen is soft.     Tenderness: There is no abdominal tenderness.  Genitourinary:    General: Normal vulva.     Exam position: Lithotomy position.     Labia:        Right: No rash,  tenderness or lesion.        Left: No rash, tenderness or lesion.   Musculoskeletal:        General: Normal range of motion.     Cervical back: Normal range of motion and neck supple.     Right lower leg: No edema.     Left lower leg: No edema.  Lymphadenopathy:     Cervical: No cervical adenopathy.     Upper Body:     Right upper body: No supraclavicular, axillary or pectoral adenopathy.     Left upper body: No supraclavicular, axillary or  pectoral adenopathy.  Skin:    General: Skin is warm and dry.  Neurological:     General: No focal deficit present.     Mental Status: She is alert and oriented to person, place, and time.     Cranial Nerves: No cranial nerve deficit.     Coordination: Coordination normal.     Gait: Gait normal.  Psychiatric:        Mood and Affect: Mood normal.        Behavior: Behavior normal.        Thought Content: Thought content normal.        Judgment: Judgment normal.   Insurance not cover this and  Results for orders placed or performed in visit on 12/22/21  POCT urine pregnancy  Result Value Ref Range   Preg Test, Ur Negative Negative  POCT glycosylated hemoglobin (Hb A1C)  Result Value Ref Range   Hemoglobin A1C 5.9 (A) 4.0 - 5.6 %   HbA1c POC (<> result, manual entry) 5.9 4.0 - 5.6 %   HbA1c, POC (prediabetic range) 5.9 5.7 - 6.4 %   HbA1c, POC (controlled diabetic range)        Assessment & Plan:   Problem List Items Addressed This Visit       Digestive   Hepatitis C (Chronic)    She has a history of hepatitis C. She recently had HCV RNA done in 11/2021 which was negative. Informed her that her hepatitis C is not active and does not need treatment. Recent liver function from 11/2021 was normal as well.         Other   History of drug abuse (HCC) (Chronic)    She has been clean for 5 years, congratulated her on this examination point continue following with Suboxone clinic.      Morbid obesity (HCC)    BMI 60.3.  Diet for weight loss.  Discussed that we could start tirzepatide next month but it comes out for the weight loss medication or she is eligible for potential surgery.  She is interested in meeting with a surgeon and talking about options.  Referral placed to bariatric surgery today      Relevant Orders   Amb Referral to Bariatric Surgery   Mixed hyperlipidemia    Recent cholesterol panel reviewed from 9/23 and was normal.      Vitamin D deficiency    Recent  labs reviewed from 11/2021 and were normal.       Other Visit Diagnoses     Routine general medical examination at a health care facility    -  Primary   Health maintenance reviewed and updated.  Reviewed recent labs from 11/2021.  Pap done to today.  Discussed nutrition, exercise.  Follow-up 1 year   Screening for cervical cancer       Pap done today   Relevant Orders   Cytology -  PAP   Screening for HIV (human immunodeficiency virus)       Screen for HIV today per patient request.   Relevant Orders   HIV Antibody (routine testing w rflx)        Follow up plan: Return in about 3 months (around 04/21/2022) for prediabetes, weight management .   LABORATORY TESTING:  - Pap smear: pap done  IMMUNIZATIONS:   - Tdap: Tetanus vaccination status reviewed: last tetanus booster within 10 years. - Influenza: Up to date - Pneumovax: Not applicable - Prevnar: Not applicable - HPV: Not applicable - Zostavax vaccine: Not applicable  SCREENING: -Mammogram: Not applicable  - Colonoscopy: Not applicable  - Bone Density: Not applicable  -Hearing Test: Not applicable  -Spirometry: Not applicable   PATIENT COUNSELING:   Advised to take 1 mg of folate supplement per day if capable of pregnancy.   Sexuality: Discussed sexually transmitted diseases, partner selection, use of condoms, avoidance of unintended pregnancy  and contraceptive alternatives.   Advised to avoid cigarette smoking.  I discussed with the patient that most people either abstain from alcohol or drink within safe limits (<=14/week and <=4 drinks/occasion for males, <=7/weeks and <= 3 drinks/occasion for females) and that the risk for alcohol disorders and other health effects rises proportionally with the number of drinks per week and how often a drinker exceeds daily limits.  Discussed cessation/primary prevention of drug use and availability of treatment for abuse.   Diet: Encouraged to adjust caloric intake to maintain   or achieve ideal body weight, to reduce intake of dietary saturated fat and total fat, to limit sodium intake by avoiding high sodium foods and not adding table salt, and to maintain adequate dietary potassium and calcium preferably from fresh fruits, vegetables, and low-fat dairy products.    stressed the importance of regular exercise  Injury prevention: Discussed safety belts, safety helmets, smoke detector, smoking near bedding or upholstery.   Dental health: Discussed importance of regular tooth brushing, flossing, and dental visits.    NEXT PREVENTATIVE PHYSICAL DUE IN 1 YEAR. Return in about 3 months (around 04/21/2022) for prediabetes, weight management .

## 2022-01-19 ENCOUNTER — Other Ambulatory Visit (HOSPITAL_COMMUNITY)
Admission: RE | Admit: 2022-01-19 | Discharge: 2022-01-19 | Disposition: A | Discharge status: Home / Self Care | Payer: BC Managed Care – PPO | Source: Ambulatory Visit | Attending: Nurse Practitioner | Admitting: Nurse Practitioner

## 2022-01-19 ENCOUNTER — Encounter: Payer: Self-pay | Admitting: Nurse Practitioner

## 2022-01-19 ENCOUNTER — Ambulatory Visit (INDEPENDENT_AMBULATORY_CARE_PROVIDER_SITE_OTHER): Payer: BC Managed Care – PPO | Admitting: Nurse Practitioner

## 2022-01-19 VITALS — BP 123/71 | HR 68 | Temp 97.0°F | Ht 68.5 in | Wt >= 6400 oz

## 2022-01-19 DIAGNOSIS — Z114 Encounter for screening for human immunodeficiency virus [HIV]: Secondary | ICD-10-CM

## 2022-01-19 DIAGNOSIS — E782 Mixed hyperlipidemia: Secondary | ICD-10-CM

## 2022-01-19 DIAGNOSIS — Z0000 Encounter for general adult medical examination without abnormal findings: Secondary | ICD-10-CM

## 2022-01-19 DIAGNOSIS — Z124 Encounter for screening for malignant neoplasm of cervix: Secondary | ICD-10-CM

## 2022-01-19 DIAGNOSIS — E559 Vitamin D deficiency, unspecified: Secondary | ICD-10-CM

## 2022-01-19 DIAGNOSIS — B1920 Unspecified viral hepatitis C without hepatic coma: Secondary | ICD-10-CM

## 2022-01-19 DIAGNOSIS — F1911 Other psychoactive substance abuse, in remission: Secondary | ICD-10-CM

## 2022-01-19 NOTE — Assessment & Plan Note (Addendum)
BMI 60.3.  Diet for weight loss.  Discussed that we could start tirzepatide next month but it comes out for the weight loss medication or she is eligible for potential surgery.  She is interested in meeting with a surgeon and talking about options.  Referral placed to bariatric surgery today

## 2022-01-19 NOTE — Assessment & Plan Note (Signed)
Recent cholesterol panel reviewed from 9/23 and was normal.

## 2022-01-19 NOTE — Patient Instructions (Addendum)
It was great to see you!  Call your pharmacy and see if they cover Wegovy (this medication is currently on backorder). However, next month when Zepbound comes out, I can order this if your insurance will cover it.   I have placed a referral to bariatric surgery for you.   Let's follow-up in 3 months, sooner if you have concerns.  If a referral was placed today, you will be contacted for an appointment. Please note that routine referrals can sometimes take up to 3-4 weeks to process. Please call our office if you haven't heard anything after this time frame.  Take care,  Rodman Pickle, NP

## 2022-01-19 NOTE — Assessment & Plan Note (Signed)
Recent labs reviewed from 11/2021 and were normal.

## 2022-01-19 NOTE — Assessment & Plan Note (Signed)
She has a history of hepatitis C. She recently had HCV RNA done in 11/2021 which was negative. Informed her that her hepatitis C is not active and does not need treatment. Recent liver function from 11/2021 was normal as well.

## 2022-01-19 NOTE — Assessment & Plan Note (Signed)
She has been clean for 5 years, congratulated her on this examination point continue following with Suboxone clinic.

## 2022-01-20 LAB — CYTOLOGY - PAP
Adequacy: ABSENT
Chlamydia: NEGATIVE
Comment: NEGATIVE
Comment: NEGATIVE
Comment: NORMAL
Diagnosis: NEGATIVE
High risk HPV: NEGATIVE
Neisseria Gonorrhea: NEGATIVE

## 2022-01-20 LAB — HIV ANTIBODY (ROUTINE TESTING W REFLEX): HIV 1&2 Ab, 4th Generation: NONREACTIVE

## 2022-01-20 MED ORDER — METRONIDAZOLE 500 MG PO TABS: 500.0000 mg | ORAL_TABLET | Freq: Two times a day (BID) | ORAL | 0 refills | Status: AC

## 2022-01-20 NOTE — Addendum Note (Signed)
Addended by: Rodman Pickle A on: 01/20/2022 03:20 PM   Modules accepted: Orders

## 2022-04-05 ENCOUNTER — Encounter: Payer: Self-pay | Admitting: Nurse Practitioner

## 2022-04-07 ENCOUNTER — Other Ambulatory Visit (HOSPITAL_COMMUNITY): Payer: Self-pay

## 2022-04-07 NOTE — Telephone Encounter (Signed)
Patient Advocate Encounter   Received notification from [employer] that prior authorization for [facility] is required.   PA submitted on 04/07/2022 Key [id number] Status is pending

## 2022-04-10 NOTE — Telephone Encounter (Signed)
Patient called and said she would like to take the medication.

## 2022-04-10 NOTE — Telephone Encounter (Signed)
I left a message for the patient to return my call.

## 2022-04-11 MED ORDER — WEGOVY 0.25 MG/0.5ML ~~LOC~~ SOAJ: 0.2500 mg | SUBCUTANEOUS | 1 refills | Status: DC

## 2022-04-11 NOTE — Telephone Encounter (Signed)
Pt called back and said that she would like taking medication

## 2022-04-11 NOTE — Telephone Encounter (Signed)
Called and spoke with pt informed her  that we have sent rx to the pharmacy , made an follow up appt for pt in 4 weeks.

## 2022-04-20 ENCOUNTER — Ambulatory Visit: Payer: BC Managed Care – PPO | Admitting: Nurse Practitioner

## 2022-05-03 ENCOUNTER — Ambulatory Visit: Payer: BC Managed Care – PPO | Admitting: Nurse Practitioner

## 2022-05-03 ENCOUNTER — Telehealth: Payer: Self-pay | Admitting: Nurse Practitioner

## 2022-05-03 NOTE — Telephone Encounter (Signed)
2.21.24 no show letter sent

## 2022-05-18 NOTE — Telephone Encounter (Signed)
1st no show, fee waived, letter sent to reschedule/notify of policy

## 2022-05-18 NOTE — Telephone Encounter (Signed)
Noted  

## 2022-06-16 ENCOUNTER — Ambulatory Visit: Payer: BC Managed Care – PPO | Admitting: Nurse Practitioner

## 2022-06-16 NOTE — Progress Notes (Deleted)
   Established Patient Office Visit  Subjective   Patient ID: Heather Day, female    DOB: 03-Jul-1985  Age: 37 y.o. MRN: [id number]  No chief complaint on file.   HPI  Heather Day is here to follow-up on weight management. Last visit she was started on Wegovy.   {History (Optional):23778}  ROS    Objective:     There were no vitals taken for this visit. {Vitals History (Optional):23777}  Physical Exam   No results found for any visits on 06/19/22.  {Labs (Optional):23779}  The ASCVD Risk score (Arnett DK, et al., 2019) failed to calculate for the following reasons:   The 2019 ASCVD risk score is only valid for ages 65 to 66    Assessment & Plan:   Problem List Items Addressed This Visit   None   No follow-ups on file.    Gerre Scull, NP

## 2022-06-19 ENCOUNTER — Ambulatory Visit: Payer: BC Managed Care – PPO | Admitting: Nurse Practitioner

## 2022-06-19 ENCOUNTER — Encounter: Payer: Self-pay | Admitting: Nurse Practitioner

## 2022-06-19 ENCOUNTER — Telehealth: Payer: Self-pay | Admitting: Nurse Practitioner

## 2022-06-19 NOTE — Telephone Encounter (Signed)
2nd no show, fee generated, final warning letter sent via mail and mychart to reschedule/notify of policy

## 2022-06-19 NOTE — Telephone Encounter (Signed)
Noted  

## 2022-07-24 DIAGNOSIS — F4323 Adjustment disorder with mixed anxiety and depressed mood: Secondary | ICD-10-CM | POA: Diagnosis not present

## 2022-08-16 DIAGNOSIS — Z6841 Body Mass Index (BMI) 40.0 and over, adult: Secondary | ICD-10-CM | POA: Insufficient documentation

## 2023-04-11 ENCOUNTER — Other Ambulatory Visit: Payer: Self-pay

## 2023-04-11 ENCOUNTER — Encounter (HOSPITAL_BASED_OUTPATIENT_CLINIC_OR_DEPARTMENT_OTHER): Payer: Self-pay

## 2023-04-11 ENCOUNTER — Emergency Department (HOSPITAL_BASED_OUTPATIENT_CLINIC_OR_DEPARTMENT_OTHER)
Admission: EM | Admit: 2023-04-11 | Discharge: 2023-04-11 | Disposition: A | Discharge status: Home / Self Care | Payer: Self-pay | Attending: Emergency Medicine | Admitting: Emergency Medicine

## 2023-04-11 DIAGNOSIS — R509 Fever, unspecified: Secondary | ICD-10-CM | POA: Insufficient documentation

## 2023-04-11 DIAGNOSIS — Z20822 Contact with and (suspected) exposure to covid-19: Secondary | ICD-10-CM | POA: Insufficient documentation

## 2023-04-11 DIAGNOSIS — R197 Diarrhea, unspecified: Secondary | ICD-10-CM | POA: Insufficient documentation

## 2023-04-11 DIAGNOSIS — R5383 Other fatigue: Secondary | ICD-10-CM | POA: Insufficient documentation

## 2023-04-11 DIAGNOSIS — J029 Acute pharyngitis, unspecified: Secondary | ICD-10-CM | POA: Insufficient documentation

## 2023-04-11 DIAGNOSIS — R0981 Nasal congestion: Secondary | ICD-10-CM | POA: Insufficient documentation

## 2023-04-11 LAB — RESP PANEL BY RT-PCR (RSV, FLU A&B, COVID)  RVPGX2
Influenza A by PCR: NEGATIVE
Influenza B by PCR: NEGATIVE
Resp Syncytial Virus by PCR: NEGATIVE
SARS Coronavirus 2 by RT PCR: NEGATIVE

## 2023-04-11 NOTE — ED Triage Notes (Signed)
In for eval of diarrhea, abd pain, and sore throat x2 days. Her boyfriend and one of his co-workers had diarrhea. Denies N/V.

## 2023-04-11 NOTE — Discharge Instructions (Signed)
You were seen in the emergency department for diarrhea, URI symptoms fever.  Your COVID flu RSV test were negative.  Please start with a clear liquid diet advance as tolerated.  Follow-up with your primary care doctor.  Return to the emergency department if any worsening or concerning symptoms

## 2023-04-11 NOTE — ED Provider Notes (Signed)
[facility] Provider Note   CSN: [id number] Arrival date & time: 04/11/23  0747     History  Chief Complaint  Patient presents with   Diarrhea    Heather Day is a 38 y.o. female.  She is here with complaint of feeling like she has a virus.  She said she has had 3 loose stools, abdominal cramps, scratchy throat and a low-grade fever.  She has multiple sick contacts at home and at work.  No significant cough or chest pain.  Feels just generally fatigued.  Tolerating p.o.  The history is provided by the patient.  Influenza Presenting symptoms: diarrhea, fatigue, fever and sore throat   Presenting symptoms: no shortness of breath   Severity:  Moderate Onset quality:  Gradual Duration:  1 day Progression:  Unchanged Chronicity:  New Risk factors: sick contacts        Home Medications Prior to Admission medications   Medication Sig Start Date End Date Taking? Authorizing Provider  furosemide (LASIX) 40 MG tablet Take 1 tablet by mouth once daily Patient taking differently: Take 40 mg by mouth daily as needed for fluid. 04/08/20  Yes [name], MD  LAMICTAL 25 MG tablet Take 50 mg by mouth daily. 12/20/21  Yes [provider]  methadone (DOLOPHINE) 10 MG/5ML solution Take 175 mg by mouth daily. Patient uncertain of mg   Yes [provider]  Multiple Vitamins-Minerals (MULTIVITAMIN WITH MINERALS) tablet Take 1 tablet by mouth daily.   Yes [provider]  NON FORMULARY Take 1 tablet by mouth daily. Healthy skin, hair and nails gummy   Yes [provider]  potassium chloride (KLOR-CON) 10 MEQ tablet Take 1 tablet by mouth once daily Patient taking differently: Take 10 mEq by mouth daily. 04/08/20  Yes [name], MD  Semaglutide-Weight Management (WEGOVY) 0.25 MG/0.5ML SOAJ Inject 0.25 mg into the skin once a week. 04/11/22   McElwee, Lauren A, NP  VISTARIL 25 MG capsule Take 50 mg by  mouth at bedtime as needed. 12/20/21   [provider]      Allergies    Patient has no known allergies.    Review of Systems   Review of Systems  Constitutional:  Positive for fatigue and fever.  HENT:  Positive for sore throat.   Respiratory:  Negative for shortness of breath.   Cardiovascular:  Negative for chest pain.  Gastrointestinal:  Positive for diarrhea.  Genitourinary:  Negative for dysuria.    Physical Exam Updated Vital Signs BP 126/77 (BP Location: Right Arm)   Pulse 61   Temp 97.6 F (36.4 C) (Oral)   Resp 16   Ht 5\' 8"  (1.727 m)   Wt (!) 169.6 kg   SpO2 97%   BMI 56.87 kg/m  Physical Exam Vitals and nursing note reviewed.  Constitutional:      General: She is not in acute distress.    Appearance: Normal appearance. She is well-developed.  HENT:     Head: Normocephalic and atraumatic.  Eyes:     Conjunctiva/sclera: Conjunctivae normal.  Cardiovascular:     Rate and Rhythm: Normal rate and regular rhythm.     Heart sounds: No murmur heard. Pulmonary:     Effort: Pulmonary effort is normal. No respiratory distress.     Breath sounds: Normal breath sounds.  Abdominal:     Palpations: Abdomen is soft.     Tenderness: There is no abdominal tenderness. There is  no guarding or rebound.  Musculoskeletal:        General: No swelling.     Cervical back: Neck supple.  Skin:    General: Skin is warm and dry.     Capillary Refill: Capillary refill takes less than 2 seconds.  Neurological:     General: No focal deficit present.     Mental Status: She is alert.     ED Results / Procedures / Treatments   Labs (all labs ordered are listed, but only abnormal results are displayed) Labs Reviewed  RESP PANEL BY RT-PCR (RSV, FLU A&B, COVID)  [id number]    EKG None  Radiology No results found.  Procedures Procedures    Medications Ordered in ED Medications - No data to display  ED Course/ Medical Decision Making/ A&P Clinical Course as of  04/11/23 1714  Wed Apr 11, 2023  0848 Patient was offered IV fluids and lab work.  She is declining at this time.  She just wants to get a swab. [MB]    Clinical Course User Index [MB] Terrilee Files, MD                                 Medical Decision Making  This patient complains of head congestion fever diarrhea; this involves an extensive number of treatment Options and is a complaint that carries with it a high risk of complications and morbidity. The differential includes viral syndrome, dehydration, COVID, flu, norovirus  I ordered, reviewed and interpreted labs, which included COVID flu negative Previous records obtained and reviewed in epic including recent bariatric notes Social determinants considered, no significant barriers Critical Interventions: None  After the interventions stated above, I reevaluated the patient and found patient to be well-appearing tolerating p.o. Admission and further testing considered, no indications for admission or further workup at this time.  Patient is declining lab work or IV fluids.  Recommended close follow-up with PCP.  Return instructions discussed         Final Clinical Impression(s) / ED Diagnoses Final diagnoses:  Diarrhea in adult patient    Rx / DC Orders ED Discharge Orders     None         Terrilee Files, MD 04/11/23 [phone]

## 2023-04-12 ENCOUNTER — Telehealth: Payer: Self-pay

## 2023-04-12 NOTE — Transitions of Care (Post Inpatient/ED Visit) (Unsigned)
   04/12/2023  Name: Heather Day MRN: [id number] DOB: 11/27/1985  Today's TOC FU Call Status: Today's TOC FU Call Status:: Unsuccessful Call (1st Attempt) Unsuccessful Call (1st Attempt) Date: 04/12/23  Attempted to reach the patient regarding the most recent Inpatient/ED visit.  Follow Up Plan: Additional outreach attempts will be made to reach the patient to complete the Transitions of Care (Post Inpatient/ED visit) call.   Signature Arvil Persons, BSN, Charity fundraiser

## 2023-04-13 ENCOUNTER — Ambulatory Visit: Payer: Self-pay | Admitting: Nurse Practitioner

## 2023-04-13 NOTE — Transitions of Care (Post Inpatient/ED Visit) (Unsigned)
   04/13/2023  Name: Heather Day MRN: [id number] DOB: 1985/08/17  Today's TOC FU Call Status: Today's TOC FU Call Status:: Unsuccessful Call (2nd Attempt) Unsuccessful Call (1st Attempt) Date: 04/12/23 Unsuccessful Call (2nd Attempt) Date: 04/13/23  Attempted to reach the patient regarding the most recent Inpatient/ED visit.  Follow Up Plan: Additional outreach attempts will be made to reach the patient to complete the Transitions of Care (Post Inpatient/ED visit) call.   Signature Arvil Persons, BSN, Charity fundraiser

## 2023-04-13 NOTE — Telephone Encounter (Signed)
Chief Complaint: Continued symptoms of Norovirus Symptoms: Diarrhea, nausea, abdominal cramping Frequency: 3 days Pertinent Negatives: Patient denies antibiotic usage Disposition: [] ED /[] Urgent Care (no appt availability in office) / [] Appointment(In office/virtual)/ []  [place] [facility]/ [x] Home Care/ [x] Refused Recommended Disposition /[]  Mobile Bus/ []  Follow-up with PCP Additional Notes: Patient called in stating she is still experiencing worsening symptoms of Norovirus after her ED visit on Wednesday. Patient is still having approximately 3 diarrhea bowel movements a day, with abdominal cramping and nausea. Advised patient on home care instructions. Patient is concerned to go to work. Advised patient on Virtual Urgent Care to express concerns and determine plan of care with work note and if further treatment is needed. Virtual Urgent Care appt created.    Copied from CRM [id number]. Topic: Clinical - Red Word Triage >> Apr 13, 2023  4:33 PM Corin V wrote: [facility] that prompted transfer to Nurse Triage: Patient was at ER on 04/11/23. She has norovirus and missed a call from nursing for follow up. She has stomach pain, is nauseas, has diarrhea, and is vomiting. Reason for Disposition  MILD-MODERATE diarrhea (e.g., 1-6 times / day more than normal)  Answer Assessment - Initial Assessment Questions 1. DIARRHEA SEVERITY: "How bad is the diarrhea?" "How many more stools have you had in the past 24 hours than normal?"    - NO DIARRHEA (SCALE 0)   - MILD (SCALE 1-3): Few loose or mushy BMs; increase of 1-3 stools over normal daily number of stools; mild increase in ostomy output.   -  MODERATE (SCALE 4-7): Increase of 4-6 stools daily over normal; moderate increase in ostomy output.   -  SEVERE (SCALE 8-10; OR "WORST POSSIBLE"): Increase of 7 or more stools daily over normal; moderate increase in ostomy output; incontinence.     3 or 4 2. ONSET: "When did the diarrhea begin?"       Felt bad on the 28th but didn't set in until 29th 3. BM CONSISTENCY: "How loose or watery is the diarrhea?"      Loose stool 4. VOMITING: "Are you also vomiting?" If Yes, ask: "How many times in the past 24 hours?"      Dry heaving, stomach upset 5. ABDOMEN PAIN: "Are you having any abdomen pain?" If Yes, ask: "What does it feel like?" (e.g., crampy, dull, intermittent, constant)      Abdominal cramping 6. ABDOMEN PAIN SEVERITY: If present, ask: "How bad is the pain?"  (e.g., Scale 1-10; mild, moderate, or severe)   - MILD (1-3): doesn't interfere with normal activities, abdomen soft and not tender to touch    - MODERATE (4-7): interferes with normal activities or awakens from sleep, abdomen tender to touch    - SEVERE (8-10): excruciating pain, doubled over, unable to do any normal activities       2 7. ORAL INTAKE: If vomiting, "Have you been able to drink liquids?" "How much liquids have you had in the past 24 hours?"     Yes - drinking water and gatorade 8. HYDRATION: "Any signs of dehydration?" (e.g., dry mouth [not just dry lips], too weak to stand, dizziness, new weight loss) "When did you last urinate?"     Headache earlier, dry mouth 9. EXPOSURE: "Have you traveled to a foreign country recently?" "Have you been exposed to anyone with diarrhea?" "Could you have eaten any food that was spoiled?"     No 10. ANTIBIOTIC USE: "Are you taking antibiotics now or have you taken  antibiotics in the past 2 months?"       No 11. OTHER SYMPTOMS: "Do you have any other symptoms?" (e.g., fever, blood in stool)       Diarrhea and nausea 12. PREGNANCY: "Is there any chance you are pregnant?" "When was your last menstrual period?"       No  Protocols used: [facility]

## 2023-04-14 ENCOUNTER — Telehealth: Payer: Self-pay

## 2023-04-16 NOTE — Transitions of Care (Post Inpatient/ED Visit) (Signed)
   04/16/2023  Name: Heather Day MRN: [id number] DOB: July 10, 1985  Today's TOC FU Call Status: Today's TOC FU Call Status:: Successful TOC FU Call Completed Unsuccessful Call (1st Attempt) Date: 04/12/23 Unsuccessful Call (2nd Attempt) Date: 04/13/23 Unsuccessful Call (3rd Attempt) Date: 04/13/23 [facility] FU Call Complete Date: 04/16/23 Patient's Name and Date of Birth confirmed.  Transition Care Management Follow-up Telephone Call Date of Discharge: 04/11/23 Discharge Facility: [facility] Type of Discharge: Emergency Department Reason for ED Visit: Other: (diarrhea) How have you been since you were released from the hospital?: Better Any questions or concerns?: No  Items Reviewed: Did you receive and understand the discharge instructions provided?: Yes Medications obtained,verified, and reconciled?: No Any new allergies since your discharge?: No Dietary orders reviewed?: Yes Do you have support at home?: Yes  Medications Reviewed Today: Medications Reviewed Today   Medications were not reviewed in this encounter     Home Care and Equipment/Supplies: Were Home Health Services Ordered?: NA Any new equipment or medical supplies ordered?: NA  Functional Questionnaire: Do you need assistance with bathing/showering or dressing?: No Do you need assistance with meal preparation?: No Do you need assistance with eating?: No Do you have difficulty maintaining continence: No Do you need assistance with getting out of bed/getting out of a chair/moving?: No Do you have difficulty managing or taking your medications?: No  Follow up appointments reviewed: PCP Follow-up appointment confirmed?: NA [facility] Follow-up appointment confirmed?: NA Do you need transportation to your follow-up appointment?: No Do you understand care options if your condition(s) worsen?: Yes-patient verbalized understanding    SIGNATURE Arvil Persons, BSN, RN

## 2023-04-16 NOTE — Telephone Encounter (Signed)
 LVM for patient to call back. ?

## 2023-04-16 NOTE — Telephone Encounter (Signed)
 LVM to return call.

## 2023-04-17 NOTE — Telephone Encounter (Signed)
I called and spoke with patient and she said that she had to let it run its course. She is feeling ok now.

## 2023-08-02 ENCOUNTER — Encounter: Payer: Self-pay | Admitting: Cardiology

## 2023-08-02 ENCOUNTER — Ambulatory Visit: Attending: Cardiology | Admitting: Cardiology

## 2023-08-02 VITALS — BP 118/74 | HR 71 | Ht 68.0 in | Wt 390.0 lb

## 2023-08-02 DIAGNOSIS — Z01818 Encounter for other preprocedural examination: Secondary | ICD-10-CM

## 2023-08-02 DIAGNOSIS — Z01810 Encounter for preprocedural cardiovascular examination: Secondary | ICD-10-CM | POA: Diagnosis not present

## 2023-08-02 DIAGNOSIS — E782 Mixed hyperlipidemia: Secondary | ICD-10-CM | POA: Diagnosis not present

## 2023-08-02 DIAGNOSIS — Z9889 Other specified postprocedural states: Secondary | ICD-10-CM

## 2023-08-02 DIAGNOSIS — F1911 Other psychoactive substance abuse, in remission: Secondary | ICD-10-CM

## 2023-08-02 DIAGNOSIS — Z6841 Body Mass Index (BMI) 40.0 and over, adult: Secondary | ICD-10-CM

## 2023-08-02 NOTE — Progress Notes (Signed)
 Cardiology Office Note:    Date:  08/02/2023   ID:  Heather Day, DOB 08-27-1985, MRN [id number]  PCP:  Odette Benjamin, NP  Cardiologist:  Ralene Burger, MD    Referring MD: Odette Benjamin, NP   Chief Complaint  Patient presents with   Medical Clearance    History of Present Illness:    Heather Day is a 38 y.o. 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  Overall she is doing fine.  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  Denies have any chest pain tightness squeezing pressure burning chest.  She does have some shortness of breath while walking but only mild.  Does not exercise on the regular basis noted in the diet.  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   Complex valvuloplasty including autologous pericardial patch leaflet augmentation and artificial Gore-tex neochord placement x4 with 30 mm Edwards mc3 ring annuloplasty   Severe tricuspid regurgitation    SOB (shortness of breath)    Swelling of both lower extremities    Wears glasses    Wound infection after surgery    Sternal wound     Past Surgical History:  Procedure Laterality Date   APPENDECTOMY     APPLICATION OF WOUND VAC N/A 05/16/2018   Procedure: APPLICATION OF WOUND VAC;  Surgeon: Gardenia Jump, MD;  Location: [facility] OR;  Service: Thoracic;  Laterality: N/A;   CARDIAC SURGERY     CESAREAN SECTION     CHOLECYSTECTOMY N/A 01/15/2018   Procedure: LAPAROSCOPIC CHOLECYSTECTOMY ERAS PATHWAY;  Surgeon: Ayesha Lente, MD;  Location: [facility] OR;  Service: General;  Laterality: N/A;   INTRAOPERATIVE CHOLANGIOGRAM N/A 01/15/2018   Procedure: INTRAOPERATIVE CHOLANGIOGRAM;  Surgeon: Ayesha Lente, MD;  Location: [facility] OR;  Service: General;  Laterality: N/A;   LAPAROSCOPIC APPENDECTOMY N/A 07/04/2017   Procedure: APPENDECTOMY LAPAROSCOPIC;  Surgeon: Jacolyn Matar, MD;  Location: [facility] ORS;  Service: General;  Laterality: N/A;   MULTIPLE EXTRACTIONS WITH ALVEOLOPLASTY Bilateral 11/29/2017   Procedure: Extraction of tooth #'s 2,5-15, and 31 with alveoloplasty and gross debridement of remaining dentition;  Surgeon: Carol Chroman, DDS;  Location: [facility] OR;  Service: Oral Surgery;  Laterality: Bilateral;   STERNAL WOUND DEBRIDEMENT N/A 05/16/2018   Procedure: STERNAL WOUND DEBRIDEMENT with Sternal wire removal x2.;  Surgeon: Gardenia Jump, MD;  Location: [facility] OR;  Service: Thoracic;  Laterality: N/A;   TEE WITHOUT CARDIOVERSION N/A 10/03/2017   Procedure: TRANSESOPHAGEAL ECHOCARDIOGRAM (  TEE);  Surgeon: Sheryle Donning, MD;  Location: [facility] ENDOSCOPY;  Service: Cardiovascular;  Laterality: N/A;   TEE WITHOUT CARDIOVERSION N/A 04/18/2018   Procedure: TRANSESOPHAGEAL ECHOCARDIOGRAM (TEE);  Surgeon: Gardenia Jump, MD;  Location: [facility] OR;  Service: Open Heart Surgery;  Laterality: N/A;   TRICUSPID VALVE REPLACEMENT N/A 04/18/2018   Procedure: TRICUSPID VALVE REPAIR;  Surgeon: Gardenia Jump, MD;  Location: [facility] OR;  Service: Open Heart Surgery;  Laterality: N/A;    Current Medications: Current Meds  Medication Sig   Cholecalciferol  125 MCG (5000 UT) capsule Take 5,000 Units by mouth daily.   furosemide  (LASIX ) 40 MG tablet Take 1 tablet by mouth once daily (Patient taking differently: Take 40 mg by mouth daily as needed for fluid.)   methadone  (DOLOPHINE ) 10 MG/5ML solution Take 175 mg by mouth daily. Patient uncertain of mg   Multiple Vitamins-Minerals (MULTIVITAMIN WITH MINERALS) tablet Take 1 tablet by mouth daily.   NON FORMULARY Take 1 tablet by mouth daily. Healthy skin, hair and nails gummy   potassium chloride  (KLOR-CON ) 10 MEQ tablet Take 1 tablet by mouth once daily (Patient taking differently: Take 10 mEq by mouth daily.)   potassium chloride  (KLOR-CON ) 10 MEQ tablet Take 10 mEq by mouth daily.   VISTARIL 25 MG capsule Take 50 mg by mouth at bedtime as needed for anxiety.   [DISCONTINUED] LAMICTAL 25 MG tablet Take 50 mg by mouth daily.   [DISCONTINUED] Semaglutide -Weight Management (WEGOVY ) 0.25 MG/0.5ML SOAJ Inject 0.25 mg into the skin once a week.     Allergies:   Patient has no known allergies.   Social History   Socioeconomic History   Marital status: Single    Spouse name: Not on file   Number of children: Not on file   Years of education: Not on file   Highest education level: Not on file  Occupational History   Occupation: Doctor, general practice  Tobacco Use   Smoking status: Former    Current packs/day: 0.00    Average packs/day: 1 pack/day for 20.0 years (20.0 ttl pk-yrs)    Types: Cigarettes    Start date: 04/26/1997    Quit date: 04/26/2017    Years since quitting: 6.2   Smokeless tobacco: Never  Vaping Use   Vaping status: Never Used  Substance and Sexual Activity   Alcohol use: No   Drug use: Not Currently    Types: Heroin    Comment: Sober for 8 years   Sexual activity: Yes  Other Topics Concern   Not on file  Social History Narrative   Not on file   Social Drivers of Health   Financial Resource Strain: Not on file  Food Insecurity: Not on file  Transportation Needs: Not on  file  Physical Activity: Not on file  Stress: Not on file  Social Connections: Not on file     Family History: The patient's family history includes ADD / ADHD in her mother and son; Alcoholism in her father; Anxiety disorder in her brother and mother; Bipolar disorder in her mother; Depression in her father and mother; Diabetes in her maternal grandmother and paternal grandfather; Early death in her maternal grandfather; Hearing loss in her maternal uncle; Hypertension in her father; Hypotension in her mother; Hypothyroidism in her mother; Miscarriages / Stillbirths in her paternal grandmother; Obesity in her maternal aunt, maternal aunt, maternal uncle, and paternal grandmother; Stroke in her maternal grandmother and paternal grandfather; Thyroid disease in her mother; Varicose Veins in her mother and  paternal grandmother. ROS:   Please see the history of present illness.    All 14 point review of systems negative except as described per history of present illness  EKGs/Labs/Other Studies Reviewed:    EKG Interpretation Date/Time:  Thursday Aug 02 2023 14:28:24 EDT Ventricular Rate:  71 PR Interval:  148 QRS Duration:  152 QT Interval:  464 QTC Calculation: 504 R Axis:   28  Text Interpretation: Normal sinus rhythm Right bundle branch block T wave abnormality, consider inferolateral ischemia Abnormal ECG When compared with ECG of 19-Apr-2018 07:03, No significant change was found Confirmed by Ralene Burger [id number]) on 08/02/2023 2:35:08 PM    Recent Labs: No results found for requested labs within last 365 days.  Recent Lipid Panel    Component Value Date/Time   CHOL 157 12/09/2021 0000   CHOL 162 07/27/2020 1100   TRIG 268 (A) 12/09/2021 0000   HDL 36 12/09/2021 0000   HDL 38 (L) 07/27/2020 1100   CHOLHDL 5.2 (H) 01/28/2020 1504   LDLCALC 100 12/09/2021 0000   LDLCALC 93 07/27/2020 1100    Physical Exam:    VS:  BP 118/74 (BP Location: Right Arm, Patient Position:  Sitting)   Pulse 71   Ht 5\' 8"  (1.727 m)   Wt (!) 390 lb (176.9 kg)   SpO2 91%   BMI 59.30 kg/m     Wt Readings from Last 3 Encounters:  08/02/23 (!) 390 lb (176.9 kg)  04/11/23 (!) 374 lb (169.6 kg)  01/19/22 (!) 402 lb 12.8 oz (182.7 kg)     GEN:  Well nourished, well developed in no acute distress HEENT: Normal NECK: No JVD; No carotid bruits LYMPHATICS: No lymphadenopathy CARDIAC: RRR, no murmurs, no rubs, no gallops RESPIRATORY:  Clear to auscultation without rales, wheezing or rhonchi  ABDOMEN: Soft, non-tender, non-distended MUSCULOSKELETAL:  No edema; No deformity  SKIN: Warm and dry LOWER EXTREMITIES: no swelling NEUROLOGIC:  Alert and oriented x 3 PSYCHIATRIC:  Normal affect   ASSESSMENT:    1. Pre-op evaluation   2. S/P tricuspid valve repair   3. Morbid obesity with BMI of 60.0-69.9, adult (HCC)   4. Mixed hyperlipidemia   5. History of drug abuse (HCC)    PLAN:    In order of problems listed above:  Cardiovascular preop evaluation.  Overall she seems to be doing clinically fine but will need to repeat echocardiogram to make sure that the repair that she got done is holding nicely.  Physical exam is limited because of her weight.  Physical exercise ability is pretty good.  So should be still fine to proceed with surgery but again final valve need to be checked. Morbid obesity contributing factor again plan is to do gastric bypass surgery. History of drug abuse she remains clear Mixed dyslipidemia I do not have any fasting lipid profile after gastric bypass surgery will recheck her cholesterol   Medication Adjustments/Labs and Tests Ordered: Current medicines are reviewed at length with the patient today.  Concerns regarding medicines are outlined above.  Orders Placed This Encounter  Procedures   EKG 12-Lead   Medication changes: No orders of the defined types were placed in this encounter.   Signed, Manfred Seed, MD, [facility] 08/02/2023 2:47 PM     [facility]

## 2023-08-02 NOTE — Patient Instructions (Addendum)
 Medication Instructions:  Your physician recommends that you continue on your current medications as directed. Please refer to the Current Medication list given to you today.  *If you need a refill on your cardiac medications before your next appointment, please call your pharmacy*   Lab Work: None Ordered If you have labs (blood work) drawn today and your tests are completely normal, you will receive your results only by: MyChart Message (if you have MyChart) OR A paper copy in the mail If you have any lab test that is abnormal or we need to change your treatment, we will call you to review the results.   Testing/Procedures: Your physician has requested that you have an echocardiogram. Echocardiography is a painless test that uses sound waves to create images of your heart. It provides your doctor with information about the size and shape of your heart and how well your heart's chambers and valves are working. This procedure takes approximately one hour. There are no restrictions for this procedure. Please do NOT wear cologne, perfume, aftershave, or lotions (deodorant is allowed). Please arrive 15 minutes prior to your appointment time.  Please note: We ask at that you not bring children with you during ultrasound (echo/ vascular) testing. Due to room size and safety concerns, children are not allowed in the ultrasound rooms during exams. Our front office staff cannot provide observation of children in our lobby area while testing is being conducted. An adult accompanying a patient to their appointment will only be allowed in the ultrasound room at the discretion of the ultrasound technician under special circumstances. We apologize for any inconvenience.    Follow-Up: At [facility], you and your health needs are our priority.  As part of our continuing mission to provide you with exceptional heart care, we have created designated Provider Care Teams.  These Care Teams include your  primary Cardiologist (physician) and Advanced Practice Providers (APPs -  Physician Assistants and Nurse Practitioners) who all work together to provide you with the care you need, when you need it.  We recommend signing up for the patient portal called "MyChart".  Sign up information is provided on this After Visit Summary.  MyChart is used to connect with patients for Virtual Visits (Telemedicine).  Patients are able to view lab/test results, encounter notes, upcoming appointments, etc.  Non-urgent messages can be sent to your provider as well.   To learn more about what you can do with MyChart, go to [email or web address].    Your next appointment:   3 month(s)  The format for your next appointment:   In Person  Provider:   [name], MD    Other Instructions NA

## 2023-08-23 ENCOUNTER — Telehealth: Payer: Self-pay | Admitting: Cardiology

## 2023-08-23 NOTE — Telephone Encounter (Signed)
 Pt requesting her last EKG results be faxed to her methadone  clinic. [phone]

## 2023-08-23 NOTE — Telephone Encounter (Signed)
 Called the patient and informed her that her latest EKG had been faxed to her methadone  clinic. Patient verbalized understanding and had no further questions at this time.

## 2023-08-29 ENCOUNTER — Ambulatory Visit: Attending: Cardiology

## 2023-08-29 DIAGNOSIS — Z01810 Encounter for preprocedural cardiovascular examination: Secondary | ICD-10-CM

## 2023-08-29 DIAGNOSIS — Z01818 Encounter for other preprocedural examination: Secondary | ICD-10-CM

## 2023-08-29 LAB — ECHOCARDIOGRAM COMPLETE
AR max vel: 2.95 cm2
AV Area VTI: 2.91 cm2
AV Area mean vel: 2.75 cm2
AV Mean grad: 5 mmHg
AV Peak grad: 8.4 mmHg
Ao pk vel: 1.45 m/s
Area-P 1/2: 3.53 cm2
S' Lateral: 4.5 cm

## 2023-09-02 ENCOUNTER — Ambulatory Visit: Payer: Self-pay | Admitting: Cardiology

## 2023-09-04 ENCOUNTER — Telehealth: Payer: Self-pay | Admitting: Cardiology

## 2023-09-04 ENCOUNTER — Encounter: Payer: Self-pay | Admitting: Cardiology

## 2023-09-04 NOTE — Telephone Encounter (Signed)
   Pre-operative Risk Assessment    Patient Name: Heather Day  DOB: Sep 02, 1985 MRN: [id number]   Date of last office visit: 08/02/23  Date of next office visit: 11/05/23   Request for Surgical Clearance    Procedure:  Gastric sleeve   Date of Surgery:  Clearance TBD                                Surgeon:  Dr. Teresa Socks Group or Practice Name:  [facility]  Phone number:  [phone] [place])  Fax number:  [phone]    Type of Clearance Requested:   - Medical    Type of Anesthesia:  She states she does not know    Additional requests/questions:    [name] [name] [name]   09/04/2023, 9:27 AM

## 2023-09-04 NOTE — Telephone Encounter (Signed)
     Primary Cardiologist: Dr. Bernie  Chart reviewed as part of pre-operative protocol coverage. Given past medical history and time since last visit, based on ACC/AHA guidelines, Sausha E Boccio would be at acceptable risk for the planned procedure without further cardiovascular testing.   Her RCRI is very low risk, 0.4% risk of major cardiac event.  I will route this recommendation to the requesting party via Epic fax function and remove from pre-op pool.  Josefa HERO. Eleena Grater NP-C     09/04/2023, 9:44 AM [facility] [address] Office [phone] Fax [phone]

## 2023-09-11 ENCOUNTER — Telehealth: Payer: Self-pay

## 2023-09-11 NOTE — Telephone Encounter (Signed)
 Left message on My Chart with Echo results per Dr. Vanetta Shawl note. Routed to PCP.

## 2023-09-13 ENCOUNTER — Telehealth: Payer: Self-pay

## 2023-09-13 NOTE — Telephone Encounter (Signed)
 Pt viewed Echo results on My Chart per Dr. Vanetta Shawl note. Routed to PCP.

## 2023-10-16 ENCOUNTER — Encounter (HOSPITAL_BASED_OUTPATIENT_CLINIC_OR_DEPARTMENT_OTHER): Payer: Self-pay | Admitting: Emergency Medicine

## 2023-10-16 ENCOUNTER — Other Ambulatory Visit: Payer: Self-pay

## 2023-10-16 ENCOUNTER — Emergency Department (HOSPITAL_BASED_OUTPATIENT_CLINIC_OR_DEPARTMENT_OTHER)
Admission: EM | Admit: 2023-10-16 | Discharge: 2023-10-16 | Disposition: A | Discharge status: Home / Self Care | Attending: Emergency Medicine | Admitting: Emergency Medicine

## 2023-10-16 DIAGNOSIS — M79604 Pain in right leg: Secondary | ICD-10-CM | POA: Diagnosis present

## 2023-10-16 DIAGNOSIS — Z7901 Long term (current) use of anticoagulants: Secondary | ICD-10-CM | POA: Insufficient documentation

## 2023-10-16 DIAGNOSIS — Z87891 Personal history of nicotine dependence: Secondary | ICD-10-CM | POA: Diagnosis not present

## 2023-10-16 MED ORDER — CLINDAMYCIN HCL 150 MG PO CAPS: 150.0000 mg | ORAL_CAPSULE | Freq: Four times a day (QID) | ORAL | 0 refills | Status: AC

## 2023-10-16 MED ORDER — CLINDAMYCIN HCL 150 MG PO CAPS
150.0000 mg | ORAL_CAPSULE | Freq: Once | ORAL | Status: AC
  Administered 2023-10-16: 150 mg via ORAL
  Filled 2023-10-16: qty 1

## 2023-10-16 MED ORDER — RIVAROXABAN 15 MG PO TABS
15.0000 mg | ORAL_TABLET | Freq: Once | ORAL | Status: AC
  Administered 2023-10-16: 15 mg via ORAL
  Filled 2023-10-16: qty 1

## 2023-10-16 MED ORDER — RIVAROXABAN (XARELTO) VTE STARTER PACK (15 & 20 MG): ORAL_TABLET | ORAL | 0 refills | Status: DC

## 2023-10-16 NOTE — ED Provider Notes (Incomplete)
 [facility] Provider Note   CSN: [id number] Arrival date & time: 10/16/23  2209     Patient presents with: Leg Pain   Heather Day is a 38 y.o. female.  {Add pertinent medical, surgical, social history, OB history to HPI:32947}  Leg Pain      Prior to Admission medications   Medication Sig Start Date End Date Taking? Authorizing Provider  clindamycin  (CLEOCIN ) 150 MG capsule Take 1 capsule (150 mg total) by mouth every 6 (six) hours. 10/16/23  Yes Pollyanna Levay, Selinda, MD  RIVAROXABAN  (XARELTO ) VTE STARTER PACK (15 & 20 MG) Follow package directions: Take one 15mg  tablet by mouth twice a day. On day 22, switch to one 20mg  tablet once a day. Take with food. 10/16/23  Yes Malary Aylesworth, Selinda, MD  Cholecalciferol 125 MCG (5000 UT) capsule Take 5,000 Units by mouth daily.    [provider]  furosemide  (LASIX ) 40 MG tablet Take 1 tablet by mouth once daily Patient taking differently: Take 40 mg by mouth daily as needed for fluid. 04/08/20   Krasowski, Robert J, MD  methadone  (DOLOPHINE ) 10 MG/5ML solution Take 175 mg by mouth daily. Patient uncertain of mg    [provider]  Multiple Vitamins-Minerals (MULTIVITAMIN WITH MINERALS) tablet Take 1 tablet by mouth daily.    [provider]  NON FORMULARY Take 1 tablet by mouth daily. Healthy skin, hair and nails gummy    [provider]  potassium chloride  (KLOR-CON ) 10 MEQ tablet Take 1 tablet by mouth once daily Patient taking differently: Take 10 mEq by mouth daily. 04/08/20   Krasowski, Robert J, MD  potassium chloride  (KLOR-CON ) 10 MEQ tablet Take 10 mEq by mouth daily.    [provider]  VISTARIL 25 MG capsule Take 50 mg by mouth at bedtime as needed for anxiety. 12/20/21   [provider]    Allergies: Patient has no known allergies.    Review of Systems  Updated Vital Signs BP (!) 143/70 (BP Location: Left Arm)   Pulse 78   Temp 98.4 F (36.9  C)   Resp (!) 22   Ht 5' 8 (1.727 m)   Wt (!) 164.7 kg   SpO2 96%   BMI 55.19 kg/m   Physical Exam Skin:    Comments: Approx 5x4 cm area of erythema, warmth, ttp to right inner mid thigh without palpable cord, good distal DP pulses bilaterally     (all labs ordered are listed, but only abnormal results are displayed) Labs Reviewed - No data to display  EKG: None  Radiology: No results found.  {Document cardiac monitor, telemetry assessment procedure when appropriate:32947} Procedures   Medications Ordered in the ED  Rivaroxaban  (XARELTO ) tablet 15 mg (has no administration in time range)  clindamycin  (CLEOCIN ) capsule 150 mg (has no administration in time range)      {Click here for ABCD2, HEART and other calculators REFRESH Note before signing:1}                              Medical Decision Making Risk Prescription drug management.   ***  {Document critical care time when appropriate  Document review of labs and clinical decision tools ie CHADS2VASC2, etc  Document your independent review of radiology images and any outside records  Document your discussion with family members, caretakers and with consultants  Document social determinants of health affecting pt's care  Document  your decision making why or why not admission, treatments were needed:32947:::1}   Final diagnoses:  Right leg pain    ED Discharge Orders          Ordered    clindamycin  (CLEOCIN ) 150 MG capsule  Every 6 hours        10/16/23 2318    RIVAROXABAN  (XARELTO ) VTE STARTER PACK (15 & 20 MG)        10/16/23 2318    US  Venous Img Lower Unilateral Right        10/16/23 2323

## 2023-10-16 NOTE — ED Triage Notes (Signed)
 Pt reports VSG surgery 10/08/23.  Now c/o R thigh soreness, hot to touch, redness to medial aspect of thigh x2 days.

## 2023-10-16 NOTE — Discharge Instructions (Signed)
 Please return tomorrow for an ultrasound.  We will update you on the results.  If the results are positive for DVT you do not need the clindamycin  but you do need the Xarelto .  If it does not show DVT then you do not need that prescription for Xarelto  but you do need the prescription for clindamycin .  I provided both prescriptions for you to use.  Please return to the ER for any fever, nausea, vomiting, lightheadedness, passing out, chest pain, shortness of breath, palpitations or any other concerns you may have.

## 2023-10-17 ENCOUNTER — Other Ambulatory Visit (HOSPITAL_BASED_OUTPATIENT_CLINIC_OR_DEPARTMENT_OTHER): Payer: Self-pay | Admitting: Emergency Medicine

## 2023-10-17 ENCOUNTER — Ambulatory Visit (HOSPITAL_BASED_OUTPATIENT_CLINIC_OR_DEPARTMENT_OTHER)
Admission: RE | Admit: 2023-10-17 | Discharge: 2023-10-17 | Disposition: A | Discharge status: Home / Self Care | Source: Ambulatory Visit | Attending: Emergency Medicine | Admitting: Emergency Medicine

## 2023-10-17 DIAGNOSIS — M79604 Pain in right leg: Secondary | ICD-10-CM

## 2023-10-17 NOTE — ED Provider Notes (Signed)
 Emergency Department Provider Note  TRIAGE NOTE: Pt reports VSG surgery 10/08/23.  Now c/o R thigh soreness, hot to touch, redness to medial aspect of thigh x2 days.   HISTORY  Chief Complaint Leg Pain   HPI Heather Day is a 38 y.o. 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 The pain has intensified over the last 24 hours and is localized to the surgical site, with associated warmth and tenderness upon palpation. The patient denies fever, leg pain, or any recent trauma to the area. She expresses concern due to a family history of blood clots, including a cousin who experienced a pulmonary embolism. The patient has a history of varicose veins but denies smoking, alcohol, or recreational drug use. She has been sober for nearly seven years and has only taken prescribed opiates post-surgery. The patient is currently taking bariatric vitamins and was advised to temporarily stop furosemide  and potassium. History was obtained from the patient and her boyfriend.  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 Authorizing Provider  clindamycin  (CLEOCIN ) 150 MG capsule Take 1 capsule (150 mg total) by mouth every 6 (six) hours. 10/16/23  Yes Tyara Dassow, Selinda, MD  RIVAROXABAN  (XARELTO ) VTE STARTER PACK (15 & 20 MG) Follow package directions: Take one 15mg  tablet by mouth twice a day. On day 22, switch to one 20mg  tablet once a day. Take with food. 10/16/23  Yes Vallen Calabrese, Selinda, MD  Cholecalciferol 125 MCG (5000 UT) capsule Take 5,000 Units by mouth daily.    [provider]  furosemide  (LASIX ) 40 MG tablet Take 1 tablet by mouth once daily Patient taking differently: Take 40 mg by mouth daily as needed for fluid. 04/08/20   Krasowski, Robert J, MD  methadone  (DOLOPHINE ) 10 MG/5ML solution Take 175 mg by mouth daily. Patient uncertain of mg    [provider]  Multiple Vitamins-Minerals (MULTIVITAMIN WITH MINERALS) tablet Take 1 tablet by mouth daily.    [provider]  NON FORMULARY Take 1 tablet by mouth daily. Healthy skin, hair and nails gummy    [provider]  potassium chloride  (KLOR-CON ) 10 MEQ tablet Take 1 tablet by mouth once daily Patient taking differently: Take 10 mEq by mouth daily. 04/08/20   Krasowski, Robert J, MD  potassium chloride  (KLOR-CON ) 10 MEQ tablet Take 10 mEq by mouth daily.    [provider]  VISTARIL 25 MG capsule Take  50 mg by mouth at bedtime as needed for anxiety. 12/20/21   [provider]    Social History Social History   Tobacco Use   Smoking status: Former    Current packs/day: 0.00    Average packs/day: 1 pack/day for 20.0 years (20.0 ttl pk-yrs)    Types: Cigarettes    Start date: 04/26/1997    Quit date: 04/26/2017    Years since quitting: 6.4   Smokeless tobacco: Never  Vaping Use   Vaping status: Never Used  Substance Use Topics   Alcohol use: No   Drug use: Not Currently    Types: Heroin    Comment: Sober for 8 years    Review of Systems: Documented in  HPI ____________________________________________  PHYSICAL EXAM: VITAL SIGNS: Triage: Blood pressure 116/60, pulse 64, temperature 98.4 F (36.9 C), resp. rate 16, height 5' 8 (1.727 m), weight (!) 164.7 kg, SpO2 96%.  Vitals:   10/16/23 2215 10/16/23 2216 10/16/23 2354  BP:  (!) 143/70 116/60  Pulse:  78 64  Resp:  (!) 22 16  Temp:  98.4 F (36.9 C)   SpO2:  96% 96%  Weight: (!) 164.7 kg    Height: 5' 8 (1.727 m)      Physical Exam Vitals and nursing note reviewed.  Constitutional:      Appearance: She is well-developed.  HENT:     Head: Normocephalic and atraumatic.  Cardiovascular:     Rate and Rhythm: Normal rate and regular rhythm.  Pulmonary:     Effort: No respiratory distress.     Breath sounds: No stridor.  Abdominal:     General: There is no distension.  Musculoskeletal:     Cervical back: Normal range of motion.  Skin:    Comments: 5x6 cm area of erythema, warmth and ttp to mid inner thigh on the right  Neurological:     Mental Status: She is alert.    INITIAL IMPRESSION / ASSESSMENT AND PLAN    ED Course  The patient, who recently underwent VSG surgery on July 28th, presented to the ED with significant pain and warmth in the right upper medial thigh area over the past 24 hours. Fam hx of dvt, anticoagulation and antibiotics given here for cellulitis vs DVT but no US  available at this time so will return here tomorrow to receive ultrasound. If negative will take antibiotics, if positive will continue xarelto . Follow up with pcp either way within a week. Discussed reasons to return to the ED.   Differential Diagnosis: Differential diagnosis includes but is not limited to: deep vein thrombosis (DVT), cellulitis, postoperative infection, and superficial thrombophlebitis.  Diagnostics Review:  Tests CONSIDERED but not performed: Ultrasound to rule out DVT.  Specialist Consultations Obtained or Considered: None mentioned.  Historians other than the  patient: Boyfriend provided additional context.  Care significantly affected by the following chronic Conditions: None mentioned.  Care significantly affected by the following Social Determinants of Health: None mentioned.  Management:  Medications: Consideration of antibiotics for possible infection; anticoagulation considered if ultrasound not immediately available.  Re-Evaluations/Course of Care: Pending ultrasound results to determine presence of DVT; antibiotics considered for possible infection.  Potential for Clinical Deterioration: Yes, due to the risk of DVT or progression of a localized infection.  High Risk of Morbidity/Mortality Consideration: Yes, due to the risk of DVT and potential for pulmonary embolism or severe infection.   FINAL IMPRESSION AND PLAN Final diagnoses:  Right leg pain    A medical  screening exam was performed and I feel the patient has had an appropriate workup for their chief complaint at this time and likelihood of emergent condition existing is low. They have been counseled on decision, DISCHARGE, follow up and which symptoms necessitate immediate return to the emergency department. They or their family verbally stated understanding and agreement with plan and discharged in stable condition.   ____________________________________________   NEW OUTPATIENT MEDICATIONS STARTED DURING THIS VISIT:  Discharge Medication List as of 10/16/2023 11:42 PM     START taking these medications   Details  clindamycin  (CLEOCIN ) 150 MG capsule Take 1 capsule (150 mg total) by mouth every 6 (six) hours., Starting Tue 10/16/2023, Print    RIVAROXABAN  (XARELTO ) VTE STARTER PACK (15 & 20 MG) Follow package directions: Take one 15mg  tablet by mouth twice a day. On day 22, switch to one 20mg  tablet once a day. Take with food., Print        Note:  This note was prepared with assistance of Dragon voice recognition software. Occasional wrong-word or sound-a-like  substitutions may have occurred due to the inherent limitations of voice recognition software.    Kynnedi Zweig, Selinda, MD 10/17/23 [phone]

## 2023-10-23 ENCOUNTER — Telehealth: Payer: Self-pay | Admitting: Nurse Practitioner

## 2023-10-23 ENCOUNTER — Ambulatory Visit: Admitting: Nurse Practitioner

## 2023-10-23 NOTE — Telephone Encounter (Signed)
 Noted

## 2023-10-23 NOTE — Telephone Encounter (Signed)
 3rd consecutive no show - dismissed per provider - last 4 scheduled appts below  05/03/2022 no show 06/16/2022 cancelled ahead 06/19/2022 no show 10/23/2023 no show  Dismissal sent via mail and mychart

## 2023-11-05 ENCOUNTER — Ambulatory Visit: Admitting: Cardiology

## 2023-11-15 DIAGNOSIS — Z9884 Bariatric surgery status: Secondary | ICD-10-CM | POA: Insufficient documentation

## 2024-01-07 ENCOUNTER — Encounter: Payer: Self-pay | Admitting: *Deleted

## 2024-01-07 ENCOUNTER — Other Ambulatory Visit: Payer: Self-pay | Admitting: *Deleted

## 2024-01-10 ENCOUNTER — Ambulatory Visit: Attending: Cardiology | Admitting: Cardiology

## 2024-02-29 ENCOUNTER — Other Ambulatory Visit: Payer: Self-pay | Admitting: *Deleted

## 2024-03-04 ENCOUNTER — Ambulatory Visit: Admitting: Cardiology

## 2024-04-21 ENCOUNTER — Ambulatory Visit (HOSPITAL_BASED_OUTPATIENT_CLINIC_OR_DEPARTMENT_OTHER): Admitting: Nurse Practitioner
